# Patient Record
Sex: Female | Born: 1975
Health system: Southern US, Community
[De-identification: ages and names within clinical notes are randomized; demographics above are authoritative.]

## PROBLEM LIST (undated history)

## (undated) DIAGNOSIS — M542 Cervicalgia: Secondary | ICD-10-CM

## (undated) DIAGNOSIS — J449 Chronic obstructive pulmonary disease, unspecified: Secondary | ICD-10-CM

## (undated) DIAGNOSIS — R569 Unspecified convulsions: Secondary | ICD-10-CM

## (undated) DIAGNOSIS — F32A Depression, unspecified: Secondary | ICD-10-CM

## (undated) DIAGNOSIS — M549 Dorsalgia, unspecified: Secondary | ICD-10-CM

## (undated) DIAGNOSIS — J45909 Unspecified asthma, uncomplicated: Secondary | ICD-10-CM

## (undated) DIAGNOSIS — G8929 Other chronic pain: Secondary | ICD-10-CM

## (undated) DIAGNOSIS — F419 Anxiety disorder, unspecified: Secondary | ICD-10-CM

## (undated) DIAGNOSIS — S069X9A Unspecified intracranial injury with loss of consciousness of unspecified duration, initial encounter: Secondary | ICD-10-CM

## (undated) DIAGNOSIS — F329 Major depressive disorder, single episode, unspecified: Secondary | ICD-10-CM

## (undated) DIAGNOSIS — R51 Headache: Secondary | ICD-10-CM

## (undated) DIAGNOSIS — E7800 Pure hypercholesterolemia, unspecified: Secondary | ICD-10-CM

## (undated) DIAGNOSIS — R519 Headache, unspecified: Secondary | ICD-10-CM

## (undated) DIAGNOSIS — J189 Pneumonia, unspecified organism: Secondary | ICD-10-CM

## (undated) DIAGNOSIS — K219 Gastro-esophageal reflux disease without esophagitis: Secondary | ICD-10-CM

## (undated) DIAGNOSIS — M1990 Unspecified osteoarthritis, unspecified site: Secondary | ICD-10-CM

## (undated) DIAGNOSIS — J42 Unspecified chronic bronchitis: Secondary | ICD-10-CM

## (undated) DIAGNOSIS — D649 Anemia, unspecified: Secondary | ICD-10-CM

## (undated) DIAGNOSIS — E162 Hypoglycemia, unspecified: Secondary | ICD-10-CM

## (undated) HISTORY — PX: BRAIN SURGERY: SHX531

---

## 2000-11-17 HISTORY — PX: DILATION AND CURETTAGE OF UTERUS: SHX78

## 2000-11-17 HISTORY — PX: WISDOM TOOTH EXTRACTION: SHX21

## 2004-11-17 HISTORY — PX: TUBAL LIGATION: SHX77

## 2009-11-29 ENCOUNTER — Ambulatory Visit: Payer: Self-pay | Admitting: Radiology

## 2009-11-29 ENCOUNTER — Emergency Department (HOSPITAL_BASED_OUTPATIENT_CLINIC_OR_DEPARTMENT_OTHER): Admission: EM | Admit: 2009-11-29 | Discharge: 2009-11-29 | Payer: Self-pay | Admitting: Emergency Medicine

## 2009-12-05 ENCOUNTER — Emergency Department (HOSPITAL_BASED_OUTPATIENT_CLINIC_OR_DEPARTMENT_OTHER): Admission: EM | Admit: 2009-12-05 | Discharge: 2009-12-06 | Payer: Self-pay | Admitting: Emergency Medicine

## 2009-12-20 ENCOUNTER — Ambulatory Visit: Payer: Self-pay | Admitting: Diagnostic Radiology

## 2009-12-20 ENCOUNTER — Emergency Department (HOSPITAL_BASED_OUTPATIENT_CLINIC_OR_DEPARTMENT_OTHER): Admission: EM | Admit: 2009-12-20 | Discharge: 2009-12-20 | Payer: Self-pay | Admitting: Emergency Medicine

## 2009-12-28 ENCOUNTER — Ambulatory Visit: Payer: Self-pay | Admitting: Diagnostic Radiology

## 2009-12-28 ENCOUNTER — Emergency Department (HOSPITAL_BASED_OUTPATIENT_CLINIC_OR_DEPARTMENT_OTHER): Admission: EM | Admit: 2009-12-28 | Discharge: 2009-12-28 | Payer: Self-pay | Admitting: Emergency Medicine

## 2010-01-19 ENCOUNTER — Emergency Department (HOSPITAL_BASED_OUTPATIENT_CLINIC_OR_DEPARTMENT_OTHER): Admission: EM | Admit: 2010-01-19 | Discharge: 2010-01-19 | Payer: Self-pay | Admitting: Emergency Medicine

## 2010-01-19 ENCOUNTER — Ambulatory Visit: Payer: Self-pay | Admitting: Diagnostic Radiology

## 2010-03-24 ENCOUNTER — Emergency Department (HOSPITAL_BASED_OUTPATIENT_CLINIC_OR_DEPARTMENT_OTHER): Admission: EM | Admit: 2010-03-24 | Discharge: 2010-03-24 | Payer: Self-pay | Admitting: Emergency Medicine

## 2011-08-18 DIAGNOSIS — S069XAA Unspecified intracranial injury with loss of consciousness status unknown, initial encounter: Secondary | ICD-10-CM

## 2011-08-18 DIAGNOSIS — S069X9A Unspecified intracranial injury with loss of consciousness of unspecified duration, initial encounter: Secondary | ICD-10-CM

## 2011-08-18 HISTORY — DX: Unspecified intracranial injury with loss of consciousness of unspecified duration, initial encounter: S06.9X9A

## 2011-08-18 HISTORY — PX: LACERATION REPAIR: SHX5168

## 2011-08-18 HISTORY — DX: Unspecified intracranial injury with loss of consciousness status unknown, initial encounter: S06.9XAA

## 2011-09-14 ENCOUNTER — Emergency Department (HOSPITAL_COMMUNITY): Payer: No Typology Code available for payment source

## 2011-09-14 ENCOUNTER — Inpatient Hospital Stay (HOSPITAL_COMMUNITY)
Admission: EM | Admit: 2011-09-14 | Discharge: 2011-09-17 | DRG: 089 | Disposition: A | Discharge status: Home / Self Care | Payer: No Typology Code available for payment source | Attending: General Surgery | Admitting: General Surgery

## 2011-09-14 ENCOUNTER — Encounter (HOSPITAL_COMMUNITY): Payer: Self-pay | Admitting: Radiology

## 2011-09-14 DIAGNOSIS — Z5900 Homelessness unspecified: Secondary | ICD-10-CM

## 2011-09-14 DIAGNOSIS — S069X9A Unspecified intracranial injury with loss of consciousness of unspecified duration, initial encounter: Secondary | ICD-10-CM

## 2011-09-14 DIAGNOSIS — J9600 Acute respiratory failure, unspecified whether with hypoxia or hypercapnia: Secondary | ICD-10-CM

## 2011-09-14 DIAGNOSIS — S0100XA Unspecified open wound of scalp, initial encounter: Secondary | ICD-10-CM | POA: Diagnosis present

## 2011-09-14 DIAGNOSIS — S0180XA Unspecified open wound of other part of head, initial encounter: Secondary | ICD-10-CM | POA: Diagnosis present

## 2011-09-14 DIAGNOSIS — D62 Acute posthemorrhagic anemia: Secondary | ICD-10-CM | POA: Diagnosis present

## 2011-09-14 DIAGNOSIS — S060X0A Concussion without loss of consciousness, initial encounter: Principal | ICD-10-CM | POA: Diagnosis present

## 2011-09-14 DIAGNOSIS — Y92410 Unspecified street and highway as the place of occurrence of the external cause: Secondary | ICD-10-CM

## 2011-09-14 DIAGNOSIS — Z23 Encounter for immunization: Secondary | ICD-10-CM

## 2011-09-14 DIAGNOSIS — F1010 Alcohol abuse, uncomplicated: Secondary | ICD-10-CM | POA: Diagnosis present

## 2011-09-14 LAB — POCT I-STAT, CHEM 8
BUN: 9 mg/dL (ref 6–23)
Calcium, Ion: 1.14 mmol/L (ref 1.12–1.32)
Chloride: 104 mEq/L (ref 96–112)
HCT: 38 % (ref 36.0–46.0)
Hemoglobin: 12.9 g/dL (ref 12.0–15.0)
Potassium: 3.1 mEq/L — ABNORMAL LOW (ref 3.5–5.1)
TCO2: 22 mmol/L (ref 0–100)

## 2011-09-14 LAB — ETHANOL: Alcohol, Ethyl (B): 108 mg/dL — ABNORMAL HIGH (ref 0–11)

## 2011-09-14 LAB — COMPREHENSIVE METABOLIC PANEL
Albumin: 3.7 g/dL (ref 3.5–5.2)
BUN: 9 mg/dL (ref 6–23)
Chloride: 102 mEq/L (ref 96–112)
Creatinine, Ser: 0.79 mg/dL (ref 0.50–1.10)
GFR calc Af Amer: >90 mL/min (ref >90)
GFR calc non Af Amer: >90 mL/min (ref >90)
Glucose, Bld: 110 mg/dL — ABNORMAL HIGH (ref 70–99)
Potassium: 3 mEq/L — ABNORMAL LOW (ref 3.5–5.1)
Total Bilirubin: 0.1 mg/dL — ABNORMAL LOW (ref 0.3–1.2)

## 2011-09-14 LAB — CBC
HCT: 35.6 % — ABNORMAL LOW (ref 36.0–46.0)
MCH: 31.7 pg (ref 26.0–34.0)
MCHC: 34 g/dL (ref 30.0–36.0)
RBC: 3.82 MIL/uL — ABNORMAL LOW (ref 3.87–5.11)
WBC: 17.3 10*3/uL — ABNORMAL HIGH (ref 4.0–10.5)

## 2011-09-14 LAB — ABO/RH: ABO/RH(D): O POS

## 2011-09-14 LAB — PROTIME-INR: INR: 1.09 (ref 0.00–1.49)

## 2011-09-14 MED ORDER — IOHEXOL 300 MG/ML  SOLN
100.0000 mL | Freq: Once | INTRAMUSCULAR | Status: AC | PRN
  Administered 2011-09-14: 100 mL via INTRAVENOUS

## 2011-09-15 ENCOUNTER — Inpatient Hospital Stay (HOSPITAL_COMMUNITY): Payer: No Typology Code available for payment source

## 2011-09-15 LAB — CBC
HCT: 32.2 % — ABNORMAL LOW (ref 36.0–46.0)
Hemoglobin: 10.4 g/dL — ABNORMAL LOW (ref 12.0–15.0)
MCH: 29.7 pg (ref 26.0–34.0)
MCHC: 32.3 g/dL (ref 30.0–36.0)
RBC: 3.5 MIL/uL — ABNORMAL LOW (ref 3.87–5.11)
WBC: 11.2 10*3/uL — ABNORMAL HIGH (ref 4.0–10.5)

## 2011-09-15 LAB — BASIC METABOLIC PANEL
BUN: 6 mg/dL (ref 6–23)
CO2: 26 mEq/L (ref 19–32)
Calcium: 8.2 mg/dL — ABNORMAL LOW (ref 8.4–10.5)
Chloride: 109 mEq/L (ref 96–112)
Sodium: 140 mEq/L (ref 135–145)

## 2011-09-16 ENCOUNTER — Inpatient Hospital Stay (HOSPITAL_COMMUNITY): Payer: No Typology Code available for payment source

## 2011-09-17 LAB — TYPE AND SCREEN
ABO/RH(D): O POS
Unit division: 0
Unit division: 0
Unit division: 0
Unit division: 0
Unit division: 0
Unit division: 0

## 2011-09-17 LAB — POCT I-STAT 4, (NA,K, GLUC, HGB,HCT)
Glucose, Bld: 103 mg/dL — ABNORMAL HIGH (ref 70–99)
HCT: 37 % (ref 36.0–46.0)
Sodium: 140 meq/L (ref 135–145)
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
## 2011-09-26 ENCOUNTER — Emergency Department (HOSPITAL_BASED_OUTPATIENT_CLINIC_OR_DEPARTMENT_OTHER)
Admission: EM | Admit: 2011-09-26 | Discharge: 2011-09-26 | Disposition: A | Discharge status: Home / Self Care | Payer: No Typology Code available for payment source | Attending: Emergency Medicine | Admitting: Emergency Medicine

## 2011-09-26 ENCOUNTER — Emergency Department (INDEPENDENT_AMBULATORY_CARE_PROVIDER_SITE_OTHER): Payer: No Typology Code available for payment source

## 2011-09-26 ENCOUNTER — Encounter (HOSPITAL_BASED_OUTPATIENT_CLINIC_OR_DEPARTMENT_OTHER): Payer: Self-pay | Admitting: *Deleted

## 2011-09-26 DIAGNOSIS — R51 Headache: Secondary | ICD-10-CM

## 2011-09-26 DIAGNOSIS — E119 Type 2 diabetes mellitus without complications: Secondary | ICD-10-CM | POA: Insufficient documentation

## 2011-09-26 DIAGNOSIS — H9209 Otalgia, unspecified ear: Secondary | ICD-10-CM

## 2011-09-26 DIAGNOSIS — S0180XA Unspecified open wound of other part of head, initial encounter: Secondary | ICD-10-CM

## 2011-09-26 DIAGNOSIS — Z4802 Encounter for removal of sutures: Secondary | ICD-10-CM | POA: Insufficient documentation

## 2011-09-26 DIAGNOSIS — M25569 Pain in unspecified knee: Secondary | ICD-10-CM | POA: Insufficient documentation

## 2011-09-26 DIAGNOSIS — S82839A Other fracture of upper and lower end of unspecified fibula, initial encounter for closed fracture: Secondary | ICD-10-CM

## 2011-09-26 DIAGNOSIS — S82409A Unspecified fracture of shaft of unspecified fibula, initial encounter for closed fracture: Secondary | ICD-10-CM

## 2011-09-26 MED ORDER — ONDANSETRON HCL 4 MG/2ML IJ SOLN
INTRAMUSCULAR | Status: AC
  Filled 2011-09-26: qty 2

## 2011-09-26 MED ORDER — HYDROMORPHONE HCL PF 2 MG/ML IJ SOLN
2.0000 mg | Freq: Once | INTRAMUSCULAR | Status: AC
  Administered 2011-09-26: 2 mg via INTRAMUSCULAR
  Filled 2011-09-26 (×2): qty 1

## 2011-09-26 MED ORDER — OXYCODONE-ACETAMINOPHEN 5-325 MG PO TABS: 1.0000 | ORAL_TABLET | Freq: Four times a day (QID) | ORAL | Status: DC | PRN
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
## 2011-09-26 NOTE — ED Notes (Signed)
Pt transported to radiology.

## 2011-09-28 ENCOUNTER — Encounter (HOSPITAL_BASED_OUTPATIENT_CLINIC_OR_DEPARTMENT_OTHER): Payer: Self-pay | Admitting: *Deleted

## 2011-09-28 ENCOUNTER — Emergency Department (HOSPITAL_BASED_OUTPATIENT_CLINIC_OR_DEPARTMENT_OTHER)
Admission: EM | Admit: 2011-09-28 | Discharge: 2011-09-28 | Disposition: A | Discharge status: Home / Self Care | Payer: No Typology Code available for payment source | Attending: Emergency Medicine | Admitting: Emergency Medicine

## 2011-09-28 DIAGNOSIS — S82839A Other fracture of upper and lower end of unspecified fibula, initial encounter for closed fracture: Secondary | ICD-10-CM | POA: Insufficient documentation

## 2011-09-28 DIAGNOSIS — E119 Type 2 diabetes mellitus without complications: Secondary | ICD-10-CM | POA: Insufficient documentation

## 2011-09-28 MED ORDER — HYDROMORPHONE HCL PF 2 MG/ML IJ SOLN
2.0000 mg | Freq: Once | INTRAMUSCULAR | Status: AC
  Administered 2011-09-28: 2 mg via INTRAMUSCULAR
  Filled 2011-09-28: qty 1

## 2011-09-28 MED ORDER — HYDROMORPHONE HCL 4 MG PO TABS: 4.0000 mg | ORAL_TABLET | ORAL | Status: AC | PRN
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
## 2011-09-28 NOTE — ED Notes (Signed)
rx x 1 for dilaudid- pt has a ride
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
## 2011-10-14 ENCOUNTER — Encounter (HOSPITAL_BASED_OUTPATIENT_CLINIC_OR_DEPARTMENT_OTHER): Payer: Self-pay | Admitting: Student

## 2011-10-14 ENCOUNTER — Emergency Department (HOSPITAL_BASED_OUTPATIENT_CLINIC_OR_DEPARTMENT_OTHER)
Admission: EM | Admit: 2011-10-14 | Discharge: 2011-10-14 | Disposition: A | Discharge status: Home / Self Care | Payer: No Typology Code available for payment source | Attending: Emergency Medicine | Admitting: Emergency Medicine

## 2011-10-14 DIAGNOSIS — M79606 Pain in leg, unspecified: Secondary | ICD-10-CM

## 2011-10-14 DIAGNOSIS — M79609 Pain in unspecified limb: Secondary | ICD-10-CM | POA: Insufficient documentation

## 2011-10-14 DIAGNOSIS — E119 Type 2 diabetes mellitus without complications: Secondary | ICD-10-CM | POA: Insufficient documentation

## 2011-10-14 MED ORDER — OXYCODONE-ACETAMINOPHEN 5-325 MG PO TABS: 2.0000 | ORAL_TABLET | ORAL | Status: AC | PRN

## 2011-10-14 NOTE — ED Notes (Signed)
Pt in with continued pain to right knee and lower leg.
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
## 2011-10-15 NOTE — ED Provider Notes (Signed)
Medical screening examination/treatment/procedure(s) were performed by non-physician practitioner and as supervising physician I was immediately available for consultation/collaboration.   Jatavian Calica, MD 10/15/11 0013 

## 2012-03-12 ENCOUNTER — Emergency Department (HOSPITAL_BASED_OUTPATIENT_CLINIC_OR_DEPARTMENT_OTHER)
Admission: EM | Admit: 2012-03-12 | Discharge: 2012-03-12 | Disposition: A | Discharge status: Home / Self Care | Payer: Self-pay | Attending: Emergency Medicine | Admitting: Emergency Medicine

## 2012-03-12 ENCOUNTER — Encounter (HOSPITAL_BASED_OUTPATIENT_CLINIC_OR_DEPARTMENT_OTHER): Payer: Self-pay

## 2012-03-12 DIAGNOSIS — R112 Nausea with vomiting, unspecified: Secondary | ICD-10-CM | POA: Insufficient documentation

## 2012-03-12 DIAGNOSIS — E119 Type 2 diabetes mellitus without complications: Secondary | ICD-10-CM | POA: Insufficient documentation

## 2012-03-12 DIAGNOSIS — G43909 Migraine, unspecified, not intractable, without status migrainosus: Secondary | ICD-10-CM | POA: Insufficient documentation

## 2012-03-12 MED ORDER — METOCLOPRAMIDE HCL 5 MG/ML IJ SOLN
10.0000 mg | Freq: Once | INTRAMUSCULAR | Status: AC
  Administered 2012-03-12: 10 mg via INTRAMUSCULAR
  Filled 2012-03-12: qty 2

## 2012-03-12 MED ORDER — DEXAMETHASONE SODIUM PHOSPHATE 10 MG/ML IJ SOLN
10.0000 mg | Freq: Once | INTRAMUSCULAR | Status: AC
  Administered 2012-03-12: 10 mg via INTRAMUSCULAR
  Filled 2012-03-12: qty 1

## 2012-03-12 MED ORDER — PROMETHAZINE HCL 25 MG/ML IJ SOLN
25.0000 mg | Freq: Once | INTRAMUSCULAR | Status: DC
Start: 2012-03-12 — End: 2012-03-12

## 2012-03-12 MED ORDER — DIPHENHYDRAMINE HCL 50 MG/ML IJ SOLN
25.0000 mg | Freq: Once | INTRAMUSCULAR | Status: DC
  Filled 2012-03-12: qty 1

## 2012-03-12 MED ORDER — BUTORPHANOL TARTRATE 2 MG/ML IJ SOLN
2.0000 mg | Freq: Once | INTRAMUSCULAR | Status: DC
  Filled 2012-03-12: qty 1
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
## 2012-03-12 NOTE — Discharge Instructions (Signed)

## 2012-03-12 NOTE — ED Notes (Signed)
Pt c/o migraine onset this morning.  Pt states it is concentrated mainly behind L eye.  Pt has HX of same.

## 2012-03-31 ENCOUNTER — Emergency Department (HOSPITAL_BASED_OUTPATIENT_CLINIC_OR_DEPARTMENT_OTHER)
Admission: EM | Admit: 2012-03-31 | Discharge: 2012-04-01 | Disposition: A | Discharge status: Home / Self Care | Payer: Self-pay | Attending: Emergency Medicine | Admitting: Emergency Medicine

## 2012-03-31 ENCOUNTER — Encounter (HOSPITAL_BASED_OUTPATIENT_CLINIC_OR_DEPARTMENT_OTHER): Payer: Self-pay | Admitting: Emergency Medicine

## 2012-03-31 DIAGNOSIS — L0291 Cutaneous abscess, unspecified: Secondary | ICD-10-CM

## 2012-03-31 DIAGNOSIS — L03319 Cellulitis of trunk, unspecified: Secondary | ICD-10-CM | POA: Insufficient documentation

## 2012-03-31 DIAGNOSIS — F17200 Nicotine dependence, unspecified, uncomplicated: Secondary | ICD-10-CM | POA: Insufficient documentation

## 2012-03-31 DIAGNOSIS — A5901 Trichomonal vulvovaginitis: Secondary | ICD-10-CM

## 2012-03-31 DIAGNOSIS — L02219 Cutaneous abscess of trunk, unspecified: Secondary | ICD-10-CM | POA: Insufficient documentation

## 2012-03-31 DIAGNOSIS — E119 Type 2 diabetes mellitus without complications: Secondary | ICD-10-CM | POA: Insufficient documentation

## 2012-03-31 HISTORY — DX: Unspecified convulsions: R56.9

## 2012-03-31 NOTE — ED Notes (Signed)
Pt reports abscess on pubic mound x1 week.  Sts she has drained it a couple times but it keeps coming back. Reports she thinks it is an ingrown hair although she could not find out.  Also she wants to be checked for "bacterial vaginosis".  Started having a discharge yesterday with low abd pain.

## 2012-04-01 LAB — URINE MICROSCOPIC-ADD ON

## 2012-04-01 LAB — URINALYSIS, ROUTINE W REFLEX MICROSCOPIC
Bilirubin Urine: NEGATIVE
Glucose, UA: NEGATIVE mg/dL
Hgb urine dipstick: NEGATIVE
Protein, ur: NEGATIVE mg/dL
Specific Gravity, Urine: 1.031 — ABNORMAL HIGH (ref 1.005–1.030)
Urobilinogen, UA: 1 mg/dL (ref 0.0–1.0)

## 2012-04-01 LAB — WET PREP, GENITAL: Yeast Wet Prep HPF POC: NONE SEEN

## 2012-04-01 MED ORDER — ONDANSETRON 8 MG PO TBDP
8.0000 mg | ORAL_TABLET | Freq: Once | ORAL | Status: AC
  Administered 2012-04-01: 8 mg via ORAL
  Filled 2012-04-01: qty 1

## 2012-04-01 MED ORDER — METRONIDAZOLE 500 MG PO TABS
2000.0000 mg | ORAL_TABLET | Freq: Once | ORAL | Status: AC
  Administered 2012-04-01: 2000 mg via ORAL
  Filled 2012-04-01: qty 4

## 2012-04-01 MED ORDER — AZITHROMYCIN 250 MG PO TABS
2000.0000 mg | ORAL_TABLET | Freq: Once | ORAL | Status: AC
  Administered 2012-04-01: 2000 mg via ORAL
  Filled 2012-04-01: qty 8

## 2012-04-01 MED ORDER — LIDOCAINE-EPINEPHRINE 2 %-1:100000 IJ SOLN
INTRAMUSCULAR | Status: AC
  Administered 2012-04-01: 1 mL
  Filled 2012-04-01: qty 1

## 2012-04-01 NOTE — ED Notes (Signed)
Cheese and crackers and Ginger Ale given with meds.

## 2012-04-01 NOTE — Discharge Instructions (Signed)

## 2012-04-01 NOTE — ED Provider Notes (Signed)
History     CSN: [id number]  Arrival date & time 03/31/12  2330   First MD Initiated Contact with Patient 04/01/12 0056      Chief Complaint  Patient presents with  . Abscess    (Consider location/radiation/quality/duration/timing/severity/associated sxs/prior treatment) HPI Is a 36 year old white female one-week history of a boil associated with a shaven pubic hair. She states she is attempted to drain it this week but it keeps returning. It is moderately painful. She's also complaining of a vaginal discharge as well as pelvic pain which began yesterday. The pain is moderate and worse with movement or palpation. She denies dysuria.  Past Medical History  Diagnosis Date  . Diabetes mellitus   . Migraine   . Seizures     Past Surgical History  Procedure Date  . Tubal ligation   . Traumatic brain injury     No family history on file.  History  Substance Use Topics  . Smoking status: Current Some Day Smoker  . Smokeless tobacco: Not on file  . Alcohol Use: Yes    OB History    Grav Para Term Preterm Abortions TAB SAB Ect Mult Living                  Review of Systems  All other systems reviewed and are negative.    Allergies  Cephalosporins; Penicillins; Erythromycin; Tramadol; and Vicodin  Home Medications   Current Outpatient Rx  Name Route Sig Dispense Refill  . ALBUTEROL SULFATE HFA 108 (90 BASE) MCG/ACT IN AERS Inhalation Inhale 2 puffs into the lungs every 6 (six) hours as needed. For shortness of breath and wheezing     . NAPROXEN SODIUM 220 MG PO TABS Oral Take 440 mg by mouth 2 (two) times daily with a meal.        BP 122/99  Pulse 99  Temp(Src) 98.3 F (36.8 C) (Oral)  Resp 16  Ht 5\' 1"  (1.549 m)  Wt 136 lb (61.689 kg)  BMI 25.70 kg/m2  SpO2 100%  LMP 03/24/2012  Physical Exam General: Well-developed, well-nourished female in no acute distress; appearance consistent with age of record HENT: normocephalic, old, well-healed left temporal  scarring Eyes: pupils equal round and reactive to light; extraocular muscles intact Neck: supple Heart: regular rate and rhythm Lungs: clear to auscultation bilaterally Abdomen: soft; nondistended; suprapubic tenderness; bowel sounds present GU: Purulent vaginal discharge; inflammation of cervix and vaginal wall; cervical motion tenderness; abnormal vaginal odor Extremities: No deformity; full range of motion Neurologic: Awake, alert and oriented; motor function intact in all extremities and symmetric; no facial droop Skin: Warm and dry Psychiatric: Normal mood and affect    ED Course  Procedures (including critical care time)  INCISION AND DRAINAGE Performed by: Paula Libra L Consent: Verbal consent obtained. Risks and benefits: risks, benefits and alternatives were discussed Type: abscess  Body area: Mons pubis  Anesthesia: local infiltration  Local anesthetic: lidocaine 2% with epinephrine  Anesthetic total: 1 ml  Complexity: Simple Blunt dissection to break up loculations  Drainage: purulent  Drainage amount: Scant   Packing material: 1/4 in iodoform gauze  Patient tolerance: Patient tolerated the procedure well with no immediate complications.      MDM   Nursing notes and vitals signs, including pulse oximetry, reviewed.  Summary of this visit's results, reviewed by myself:  Labs:  Results for orders placed during the hospital encounter of 03/31/12  URINALYSIS, ROUTINE W REFLEX MICROSCOPIC      Component Value Range  Color, Urine YELLOW  YELLOW    APPearance CLEAR  CLEAR    Specific Gravity, Urine 1.031 (*) 1.005 - 1.030    pH 6.0  5.0 - 8.0    Glucose, UA NEGATIVE  NEGATIVE (mg/dL)   Hgb urine dipstick NEGATIVE  NEGATIVE    Bilirubin Urine NEGATIVE  NEGATIVE    Ketones, ur NEGATIVE  NEGATIVE (mg/dL)   Protein, ur NEGATIVE  NEGATIVE (mg/dL)   Urobilinogen, UA 1.0  0.0 - 1.0 (mg/dL)   Nitrite NEGATIVE  NEGATIVE    Leukocytes, UA SMALL (*)  NEGATIVE   WET PREP, GENITAL      Component Value Range   Yeast Wet Prep HPF POC NONE SEEN  NONE SEEN    Trich, Wet Prep MANY (*) NONE SEEN    Clue Cells Wet Prep HPF POC NONE SEEN  NONE SEEN    WBC, Wet Prep HPF POC TOO NUMEROUS TO COUNT (*) NONE SEEN   URINE MICROSCOPIC-ADD ON      Component Value Range   Squamous Epithelial / LPF FEW (*) RARE    WBC, UA 3-6  <3 (WBC/hpf)   RBC / HPF 0-2  <3 (RBC/hpf)   Bacteria, UA FEW (*) RARE    Urine-Other MUCOUS PRESENT      2:01 AM The patient's grossly purulent discharge is concerning for gonorrhea or Chlamydia infection. Although cephalosporins are the drug of choice she is allergic to cephalosporins. We will treat her with 2 g of Zithromax. She states she's not allergic to Zithromax and has taken it before without difficulty.         Hanley Seamen, MD 04/01/12 0201

## 2012-04-02 LAB — URINE CULTURE: Colony Count: >=100000

## 2012-04-19 ENCOUNTER — Encounter (HOSPITAL_BASED_OUTPATIENT_CLINIC_OR_DEPARTMENT_OTHER): Payer: Self-pay | Admitting: Family Medicine

## 2012-04-19 ENCOUNTER — Emergency Department (HOSPITAL_BASED_OUTPATIENT_CLINIC_OR_DEPARTMENT_OTHER): Payer: Self-pay

## 2012-04-19 ENCOUNTER — Emergency Department (HOSPITAL_BASED_OUTPATIENT_CLINIC_OR_DEPARTMENT_OTHER)
Admission: EM | Admit: 2012-04-19 | Discharge: 2012-04-19 | Disposition: A | Discharge status: Home / Self Care | Payer: Self-pay | Attending: Emergency Medicine | Admitting: Emergency Medicine

## 2012-04-19 DIAGNOSIS — S335XXA Sprain of ligaments of lumbar spine, initial encounter: Secondary | ICD-10-CM | POA: Insufficient documentation

## 2012-04-19 DIAGNOSIS — W19XXXA Unspecified fall, initial encounter: Secondary | ICD-10-CM | POA: Insufficient documentation

## 2012-04-19 DIAGNOSIS — S39012A Strain of muscle, fascia and tendon of lower back, initial encounter: Secondary | ICD-10-CM

## 2012-04-19 DIAGNOSIS — F17200 Nicotine dependence, unspecified, uncomplicated: Secondary | ICD-10-CM | POA: Insufficient documentation

## 2012-04-19 DIAGNOSIS — E119 Type 2 diabetes mellitus without complications: Secondary | ICD-10-CM | POA: Insufficient documentation

## 2012-04-19 DIAGNOSIS — Z79899 Other long term (current) drug therapy: Secondary | ICD-10-CM | POA: Insufficient documentation

## 2012-04-19 DIAGNOSIS — G43909 Migraine, unspecified, not intractable, without status migrainosus: Secondary | ICD-10-CM | POA: Insufficient documentation

## 2012-04-19 MED ORDER — KETOROLAC TROMETHAMINE 60 MG/2ML IM SOLN
60.0000 mg | Freq: Once | INTRAMUSCULAR | Status: AC
  Administered 2012-04-19: 60 mg via INTRAMUSCULAR
  Filled 2012-04-19: qty 2

## 2012-04-19 MED ORDER — OXYCODONE-ACETAMINOPHEN 5-325 MG PO TABS: 2.0000 | ORAL_TABLET | Freq: Four times a day (QID) | ORAL | Status: DC | PRN

## 2012-04-19 MED ORDER — OXYCODONE-ACETAMINOPHEN 5-325 MG PO TABS
2.0000 | ORAL_TABLET | Freq: Once | ORAL | Status: AC
  Administered 2012-04-19: 2 via ORAL
  Filled 2012-04-19: qty 2

## 2012-04-19 NOTE — ED Notes (Signed)
MD at bedside. 

## 2012-04-19 NOTE — ED Provider Notes (Signed)
History   This chart was scribed for Cyndra Numbers, MD by Toya Smothers. The patient was seen in room MH12/MH12. Patient's care was started at 1758.  CSN: [id number]  Arrival date & time 04/19/12  1758   First MD Initiated Contact with Patient 04/19/12 1823      Chief Complaint  Patient presents with  . Back Pain   The history is provided by the patient. No language interpreter was used.    Christine Campbell is a 36 y.o. female who presents to the Emergency Department complaining of sudden onset moderate severe constant left lower back pain onset 20 hours described as shooting radiating down leg aggravated while walking, denying incontinance, weakness, and gait problems. Pt states that was helping someone move boxes and slipped falling onto her left lower back causing pain and loss of sleep for which she treated herself with Ibuprofen and ice. Pt has a reported  h/o Diabetes (she denies being on any medicines for this), Migraines, and Seizures and list allergies to CEPHALOSPORINS, PENICILLINS, and ERYTHROMYCIN.PAtient rates her pain as 8/10.  She has no history of malignancy, heavy steroid use for comorbid illness, prior back injury or surgery, or osteoporosis. There are no other associated or modifying factors.    Past Medical History  Diagnosis Date  . Diabetes mellitus   . Migraine   . Seizures     Past Surgical History  Procedure Date  . Tubal ligation   . Traumatic brain injury     History reviewed. No pertinent family history.  History  Substance Use Topics  . Smoking status: Current Some Day Smoker  . Smokeless tobacco: Not on file  . Alcohol Use: Yes   Review of Systems  Constitutional: Negative for fever and chills.  HENT: Negative for rhinorrhea and neck pain.   Eyes: Negative for pain.  Respiratory: Negative for cough and shortness of breath.   Cardiovascular: Negative for chest pain.  Gastrointestinal: Negative for nausea, vomiting, abdominal pain and diarrhea.    Genitourinary: Negative for dysuria.  Musculoskeletal: Positive for back pain. Negative for gait problem.  Skin: Negative for rash.  Neurological: Negative for weakness and headaches.    Allergies  Cephalosporins; Penicillins; Erythromycin; Tramadol; and Vicodin  Home Medications   Current Outpatient Rx  Name Route Sig Dispense Refill  . ALBUTEROL SULFATE HFA 108 (90 BASE) MCG/ACT IN AERS Inhalation Inhale 2 puffs into the lungs every 6 (six) hours as needed. For shortness of breath and wheezing     . NAPROXEN SODIUM 220 MG PO TABS Oral Take 440 mg by mouth 2 (two) times daily with a meal.        BP 152/89  Pulse 112  Temp(Src) 98.2 F (36.8 C) (Oral)  Resp 16  SpO2 100%  LMP 04/17/2012  Physical Exam  Nursing note and vitals reviewed. Constitutional: She is oriented to person, place, and time. She appears well-developed and well-nourished. No distress.  HENT:  Head: Normocephalic and atraumatic.  Eyes: EOM are normal. Pupils are equal, round, and reactive to light.  Neck: Neck supple. No tracheal deviation present.  Cardiovascular: Normal rate.   Pulmonary/Chest: Effort normal and breath sounds normal. No respiratory distress.  Abdominal: Soft. She exhibits no distension. There is no tenderness. There is no rebound and no guarding.  Musculoskeletal: Normal range of motion. She exhibits no edema.       Tenderness to palpation of L sacroiliac joint and L paraspinal musculature. No midline tenderness or step-off.   Neurological: She  is alert and oriented to person, place, and time. No sensory deficit.       No nerurological finding or strength deficits.   Skin: Skin is warm and dry. No rash noted.  Psychiatric: She has a normal mood and affect. Her behavior is normal.    ED Course  Procedures (including critical care time) DIAGNOSTIC STUDIES: Oxygen Saturation is 100% on room air, normal by my interpretation.    COORDINATION OF CARE: 18:30- Evaluated state of Pt's  present illness.  Labs Reviewed - No data to display Dg Lumbar Spine Complete  04/19/2012  *RADIOLOGY REPORT*  Clinical Data: Larey Seat yesterday, left-sided back pain  LUMBAR SPINE - COMPLETE 4+ VIEW  Comparison: None.  Findings: Anatomic alignment.  Slight disc space narrowing L1-L2 is chronic.  No compression fracture or traumatic subluxation.  Mild lower lumbar facet arthropathy.  IMPRESSION: No acute findings.  Mild degenerative changes as described.  Original Report Authenticated By: Elsie Stain, M.D.   Dg Pelvis 1-2 Views  04/19/2012  *RADIOLOGY REPORT*  Clinical Data: Larey Seat yesterday, left hip pain  PELVIS - 1-2 VIEW  Comparison:  None.  Findings:  There is no evidence of pelvic fracture or diastasis. No other pelvic bone lesions are seen. Incidental bilateral tubal ligation clips.  IMPRESSION: Negative.  Original Report Authenticated By: Elsie Stain, M.D.    1. Back strain, initial encounter      MDM  Patient presented after mechanical fall with what appeared to be musculoskeletal pain.  She had no red flag symptoms associated with her back pain to indicate cord injury.  Patient denied GI, urinary, or vaginal symptoms making other possibilities such as UTI, nephrolithiasis, PID, or constipation unlikely as alternative diagnoses.  Patient was given 2 tabs of percocet as she had just taken anti-inflammatory.  Plain films were performed Given degree of pain as well as the fall described.  These were negative.  Patient was given Rx for percocet and told to continue her NSAIDS.  She requested a muscle relaxer "other than flexeril" but was told that this was not necessary today as her symptoms did not represent spasmodic pain.  Patient was discharged in good condition. I personally performed the services described in this documentation, which was scribed in my presence. The recorded information has been reviewed and considered.      Cyndra Numbers, MD 04/21/12 [phone]

## 2012-04-19 NOTE — ED Notes (Signed)
Pt sts she was carrying boxes down steps and slipped and fell onto concrete hit left low back. Pt ambulatory.  Pt took otc ibuprofen.

## 2012-04-19 NOTE — ED Notes (Signed)
Pt called to verifiy she had a ride

## 2012-04-30 ENCOUNTER — Emergency Department (HOSPITAL_BASED_OUTPATIENT_CLINIC_OR_DEPARTMENT_OTHER)
Admission: EM | Admit: 2012-04-30 | Discharge: 2012-04-30 | Disposition: A | Discharge status: Home / Self Care | Payer: Self-pay | Attending: Emergency Medicine | Admitting: Emergency Medicine

## 2012-04-30 ENCOUNTER — Encounter (HOSPITAL_BASED_OUTPATIENT_CLINIC_OR_DEPARTMENT_OTHER): Payer: Self-pay | Admitting: *Deleted

## 2012-04-30 DIAGNOSIS — L03211 Cellulitis of face: Secondary | ICD-10-CM | POA: Insufficient documentation

## 2012-04-30 DIAGNOSIS — L700 Acne vulgaris: Secondary | ICD-10-CM

## 2012-04-30 DIAGNOSIS — L0201 Cutaneous abscess of face: Secondary | ICD-10-CM | POA: Insufficient documentation

## 2012-04-30 DIAGNOSIS — E119 Type 2 diabetes mellitus without complications: Secondary | ICD-10-CM | POA: Insufficient documentation

## 2012-04-30 DIAGNOSIS — L708 Other acne: Secondary | ICD-10-CM | POA: Insufficient documentation

## 2012-04-30 DIAGNOSIS — F17200 Nicotine dependence, unspecified, uncomplicated: Secondary | ICD-10-CM | POA: Insufficient documentation

## 2012-04-30 DIAGNOSIS — L0390 Cellulitis, unspecified: Secondary | ICD-10-CM

## 2012-04-30 MED ORDER — OXYCODONE-ACETAMINOPHEN 5-325 MG PO TABS: 1.0000 | ORAL_TABLET | ORAL | Status: AC | PRN

## 2012-04-30 MED ORDER — MINOCYCLINE HCL 100 MG PO CAPS: 100.0000 mg | ORAL_CAPSULE | Freq: Two times a day (BID) | ORAL | Status: DC

## 2012-04-30 MED ORDER — DOXYCYCLINE HYCLATE 100 MG PO TABS: 100.0000 mg | ORAL_TABLET | Freq: Two times a day (BID) | ORAL | Status: DC

## 2012-04-30 MED ORDER — OXYCODONE-ACETAMINOPHEN 5-325 MG PO TABS: 2.0000 | ORAL_TABLET | Freq: Four times a day (QID) | ORAL | Status: DC | PRN

## 2012-04-30 NOTE — Discharge Instructions (Signed)
Make sure to use warm compresses on your chin three times daily for 15 minutes. Start antibiotic twice daily. If this fails to improve, spreads, you have fever or vomiting then return to the emergency department.

## 2012-04-30 NOTE — ED Notes (Signed)
Pt c/o abscess to right chin x 2 days

## 2012-04-30 NOTE — ED Provider Notes (Signed)
History     CSN: [id number]  Arrival date & time 04/30/12  1638   First MD Initiated Contact with Patient 04/30/12 1656      Chief Complaint  Patient presents with  . Abscess    (Consider location/radiation/quality/duration/timing/severity/associated sxs/prior treatment) HPI Comments: 36 yo female with abscess on right jawline. She noticed a pimple one week ago. Squeezed and didn't have much pus. Continued to grow and causing much pain, turning red on the outside, feeling of fullness in cheek. She denies dysphagia, odynophagia, fever, dyspnea, stridor, neck pain.   Patient is a 36 y.o. female presenting with abscess.  Abscess  Pertinent negatives include no fever and no cough.    Past Medical History  Diagnosis Date  . Diabetes mellitus   . Migraine   . Seizures     Past Surgical History  Procedure Date  . Tubal ligation   . Traumatic brain injury     History reviewed. No pertinent family history.  History  Substance Use Topics  . Smoking status: Current Some Day Smoker -- 0.5 packs/day  . Smokeless tobacco: Not on file  . Alcohol Use: Yes    OB History    Grav Para Term Preterm Abortions TAB SAB Ect Mult Living                  Review of Systems  Constitutional: Negative for fever, chills and fatigue.  HENT: Positive for facial swelling. Negative for ear pain, neck pain and neck stiffness.   Eyes: Negative for pain.  Respiratory: Negative for cough, chest tightness, shortness of breath and stridor.   Cardiovascular: Negative for chest pain.  All other systems reviewed and are negative.    Allergies  Cephalosporins; Penicillins; Erythromycin; Tramadol; and Vicodin  Home Medications   Current Outpatient Rx  Name Route Sig Dispense Refill  . ALBUTEROL SULFATE HFA 108 (90 BASE) MCG/ACT IN AERS Inhalation Inhale 2 puffs into the lungs every 6 (six) hours as needed. For shortness of breath and wheezing     . NAPROXEN SODIUM 220 MG PO TABS Oral Take 440  mg by mouth 2 (two) times daily with a meal.      . OXYCODONE-ACETAMINOPHEN 5-325 MG PO TABS Oral Take 2 tablets by mouth every 6 (six) hours as needed for pain. 30 tablet 0    BP 135/85  Pulse 87  Temp 97.8 F (36.6 C) (Oral)  Resp 16  Ht 5\' 1"  (1.549 m)  Wt 136 lb (61.689 kg)  BMI 25.70 kg/m2  SpO2 100%  LMP 04/16/2012  Physical Exam  Vitals reviewed. Constitutional: She is oriented to person, place, and time. She appears well-developed and well-nourished. No distress.  HENT:  Head: Atraumatic.  Mouth/Throat: Oropharynx is clear and moist.       1.5 cm area of induration with central scabbing on right mandibular angle, underlying firm nodule. Surrounding erythema. Very TTP. No significant LAD. Normal cervical ROM.  Neck: Normal range of motion. Neck supple. No tracheal deviation present. No thyromegaly present.  Cardiovascular: Normal rate, regular rhythm and normal heart sounds.   No murmur heard. Pulmonary/Chest: Effort normal. No stridor.  Lymphadenopathy:    She has no cervical adenopathy.  Neurological: She is alert and oriented to person, place, and time. Coordination normal.  Skin: She is not diaphoretic. No pallor.  Psychiatric: She has a normal mood and affect.    ED Course  Procedures (including critical care time)  Labs Reviewed - No data to display No  results found.   1. Nodular acne   2. Cellulitis       MDM  Nodular cyst on face with some cellulitis. Bedside ultrasound does not show drainable abscess. Patient allergic to multiple antibiotics and limited by lack of insurance. Discussion with patient and pharmacy, can afford minocycline (doxycycline too expensive). Given 10 day course and short term pain relief percocet 5/325mg  #6 (patient also notes multiple allergies to pain meds). advised TID compresses to area. Discussed RTC for red flag, emesis, fever, spread of infection.         Durwin Reges, MD 04/30/12 [phone]

## 2012-05-01 NOTE — ED Provider Notes (Signed)
I saw and evaluated the patient, reviewed the resident's note and I agree with the findings and plan.   .Face to face Exam:  General:  Awake HEENT:  Atraumatic Resp:  Normal effort Abd:  Nondistended Neuro:No focal weakness Lymph: No adenopathy   Nelia Shi, MD 05/01/12 1549

## 2012-05-11 ENCOUNTER — Encounter (HOSPITAL_BASED_OUTPATIENT_CLINIC_OR_DEPARTMENT_OTHER): Payer: Self-pay | Admitting: *Deleted

## 2012-05-11 ENCOUNTER — Emergency Department (HOSPITAL_BASED_OUTPATIENT_CLINIC_OR_DEPARTMENT_OTHER)
Admission: EM | Admit: 2012-05-11 | Discharge: 2012-05-11 | Disposition: A | Discharge status: Home / Self Care | Payer: Self-pay | Attending: Emergency Medicine | Admitting: Emergency Medicine

## 2012-05-11 DIAGNOSIS — F17200 Nicotine dependence, unspecified, uncomplicated: Secondary | ICD-10-CM | POA: Insufficient documentation

## 2012-05-11 DIAGNOSIS — M549 Dorsalgia, unspecified: Secondary | ICD-10-CM | POA: Insufficient documentation

## 2012-05-11 DIAGNOSIS — E119 Type 2 diabetes mellitus without complications: Secondary | ICD-10-CM | POA: Insufficient documentation

## 2012-05-11 MED ORDER — OXYCODONE-ACETAMINOPHEN 5-325 MG PO TABS
2.0000 | ORAL_TABLET | Freq: Once | ORAL | Status: AC
  Administered 2012-05-11: 2 via ORAL

## 2012-05-11 MED ORDER — KETOROLAC TROMETHAMINE 60 MG/2ML IM SOLN
60.0000 mg | Freq: Once | INTRAMUSCULAR | Status: AC
  Administered 2012-05-11: 60 mg via INTRAMUSCULAR
  Filled 2012-05-11: qty 2

## 2012-05-11 MED ORDER — OXYCODONE-ACETAMINOPHEN 5-325 MG PO TABS: 2.0000 | ORAL_TABLET | ORAL | Status: AC | PRN

## 2012-05-11 MED ORDER — OXYCODONE-ACETAMINOPHEN 5-325 MG PO TABS
ORAL_TABLET | ORAL | Status: AC
  Filled 2012-05-11: qty 2

## 2012-05-11 MED ORDER — PREDNISONE 10 MG PO TABS: ORAL_TABLET | ORAL | Status: DC

## 2012-05-11 NOTE — Discharge Instructions (Signed)
Back Pain, Adult Low back pain is very common. About 1 in 5 people have back pain.The cause of low back pain is rarely dangerous. The pain often gets better over time.About half of people with a sudden onset of back pain feel better in just 2 weeks. About 8 in 10 people feel better by 6 weeks.  CAUSES Some common causes of back pain include:  Strain of the muscles or ligaments supporting the spine.   Wear and tear (degeneration) of the spinal discs.   Arthritis.   Direct injury to the back.  DIAGNOSIS Most of the time, the direct cause of low back pain is not known.However, back pain can be treated effectively even when the exact cause of the pain is unknown.Answering your caregiver's questions about your overall health and symptoms is one of the most accurate ways to make sure the cause of your pain is not dangerous. If your caregiver needs more information, he or she may order lab work or imaging tests (X-rays or MRIs).However, even if imaging tests show changes in your back, this usually does not require surgery. HOME CARE INSTRUCTIONS For many people, back pain returns.Since low back pain is rarely dangerous, it is often a condition that people can learn to manageon their own.   Remain active. It is stressful on the back to sit or stand in one place. Do not sit, drive, or stand in one place for more than 30 minutes at a time. Take short walks on level surfaces as soon as pain allows.Try to increase the length of time you walk each day.   Do not stay in bed.Resting more than 1 or 2 days can delay your recovery.   Do not avoid exercise or work.Your body is made to move.It is not dangerous to be active, even though your back may hurt.Your back will likely heal faster if you return to being active before your pain is gone.   Pay attention to your body when you bend and lift. Many people have less discomfortwhen lifting if they bend their knees, keep the load close to their  bodies,and avoid twisting. Often, the most comfortable positions are those that put less stress on your recovering back.   Find a comfortable position to sleep. Use a firm mattress and lie on your side with your knees slightly bent. If you lie on your back, put a pillow under your knees.   Only take over-the-counter or prescription medicines as directed by your caregiver. Over-the-counter medicines to reduce pain and inflammation are often the most helpful.Your caregiver may prescribe muscle relaxant drugs.These medicines help dull your pain so you can more quickly return to your normal activities and healthy exercise.   Put ice on the injured area.   Put ice in a plastic bag.   Place a towel between your skin and the bag.   Leave the ice on for 15 to 20 minutes, 3 to 4 times a day for the first 2 to 3 days. After that, ice and heat may be alternated to reduce pain and spasms.   Ask your caregiver about trying back exercises and gentle massage. This may be of some benefit.   Avoid feeling anxious or stressed.Stress increases muscle tension and can worsen back pain.It is important to recognize when you are anxious or stressed and learn ways to manage it.Exercise is a great option.  SEEK MEDICAL CARE IF:  You have pain that is not relieved with rest or medicine.   You have   pain that does not improve in 1 week.   You have new symptoms.   You are generally not feeling well.  SEEK IMMEDIATE MEDICAL CARE IF:   You have pain that radiates from your back into your legs.   You develop new bowel or bladder control problems.   You have unusual weakness or numbness in your arms or legs.   You develop nausea or vomiting.   You develop abdominal pain.   You feel faint.  Document Released: 11/03/2005 Document Revised: 10/23/2011 Document Reviewed: 03/24/2011 [employer] Patient Information 2012 [employer]. 

## 2012-05-11 NOTE — ED Provider Notes (Signed)
History     CSN: [id number]  Arrival date & time 05/11/12  1718   First MD Initiated Contact with Patient 05/11/12 1915      Chief Complaint  Patient presents with  . Back Pain    (Consider location/radiation/quality/duration/timing/severity/associated sxs/prior treatment) Patient is a 36 y.o. female presenting with back pain. The history is provided by the patient. No language interpreter was used.  Back Pain  This is a new problem. The problem occurs constantly. The problem has been gradually worsening. The pain is associated with a recent injury. The pain is present in the thoracic spine. The quality of the pain is described as stabbing, aching and burning. The pain radiates to the left thigh. Associated symptoms include tingling. She has tried nothing for the symptoms.  Pt complains of pain in her low back after lifting furniture.  Pt complains of pain mid upper left back that goes down to left leg,  Past Medical History  Diagnosis Date  . Diabetes mellitus   . Migraine   . Seizures     Past Surgical History  Procedure Date  . Tubal ligation   . Traumatic brain injury     No family history on file.  History  Substance Use Topics  . Smoking status: Current Some Day Smoker -- 0.5 packs/day  . Smokeless tobacco: Not on file  . Alcohol Use: Yes    OB History    Grav Para Term Preterm Abortions TAB SAB Ect Mult Living                  Review of Systems  Musculoskeletal: Positive for back pain.  Neurological: Positive for tingling.  All other systems reviewed and are negative.    Allergies  Cephalosporins; Penicillins; Erythromycin; Tramadol; and Vicodin  Home Medications   Current Outpatient Rx  Name Route Sig Dispense Refill  . ALBUTEROL SULFATE HFA 108 (90 BASE) MCG/ACT IN AERS Inhalation Inhale 2 puffs into the lungs every 6 (six) hours as needed. For shortness of breath and wheezing     . MINOCYCLINE HCL 100 MG PO CAPS Oral Take 1 capsule (100 mg  total) by mouth 2 (two) times daily. 20 capsule 0  . OXYCODONE-ACETAMINOPHEN 5-325 MG PO TABS Oral Take 1 tablet by mouth every 4 (four) hours as needed for pain. 6 tablet 0    BP 137/79  Pulse 90  Temp 98 F (36.7 C) (Oral)  Resp 20  SpO2 100%  LMP 04/16/2012  Physical Exam  Nursing note and vitals reviewed. Constitutional: She is oriented to person, place, and time. She appears well-developed and well-nourished.  HENT:  Head: Normocephalic and atraumatic.  Eyes: Pupils are equal, round, and reactive to light.  Cardiovascular: Normal rate and regular rhythm.   Pulmonary/Chest: Effort normal and breath sounds normal.  Abdominal: Soft. Bowel sounds are normal.  Musculoskeletal: Normal range of motion.  Neurological: She is alert and oriented to person, place, and time. She has normal reflexes.  Skin: Skin is warm.  Psychiatric: She has a normal mood and affect.    ED Course  Procedures (including critical care time)  Labs Reviewed - No data to display No results found.   No diagnosis found.   Pt given torodol here.  Pt given rx for percocet and prednisone MDM  Pt advised to follow up with her Orthopaedist      Elson Areas, [place] 05/11/12 1955

## 2012-05-11 NOTE — ED Notes (Signed)
No answer in waiting room when called to treatment room

## 2012-05-11 NOTE — ED Notes (Signed)
PA at bedside.

## 2012-05-11 NOTE — ED Notes (Signed)
Back pain with radiation into her left leg. Helped her sister move this weekend and thinks she may have pulled a muscle.

## 2012-05-12 NOTE — ED Provider Notes (Signed)
Medical screening examination/treatment/procedure(s) were performed by non-physician practitioner and as supervising physician I was immediately available for consultation/collaboration.   Aliveah Gallant, MD 05/12/12 1720 

## 2012-06-05 ENCOUNTER — Emergency Department (HOSPITAL_BASED_OUTPATIENT_CLINIC_OR_DEPARTMENT_OTHER)
Admission: EM | Admit: 2012-06-05 | Discharge: 2012-06-05 | Disposition: A | Discharge status: Home / Self Care | Payer: Self-pay | Attending: Emergency Medicine | Admitting: Emergency Medicine

## 2012-06-05 ENCOUNTER — Emergency Department (HOSPITAL_BASED_OUTPATIENT_CLINIC_OR_DEPARTMENT_OTHER): Payer: Self-pay

## 2012-06-05 ENCOUNTER — Encounter (HOSPITAL_BASED_OUTPATIENT_CLINIC_OR_DEPARTMENT_OTHER): Payer: Self-pay

## 2012-06-05 DIAGNOSIS — W010XXA Fall on same level from slipping, tripping and stumbling without subsequent striking against object, initial encounter: Secondary | ICD-10-CM | POA: Insufficient documentation

## 2012-06-05 DIAGNOSIS — Y93E1 Activity, personal bathing and showering: Secondary | ICD-10-CM | POA: Insufficient documentation

## 2012-06-05 DIAGNOSIS — Y92009 Unspecified place in unspecified non-institutional (private) residence as the place of occurrence of the external cause: Secondary | ICD-10-CM | POA: Insufficient documentation

## 2012-06-05 DIAGNOSIS — M549 Dorsalgia, unspecified: Secondary | ICD-10-CM | POA: Insufficient documentation

## 2012-06-05 DIAGNOSIS — R51 Headache: Secondary | ICD-10-CM | POA: Insufficient documentation

## 2012-06-05 DIAGNOSIS — F17200 Nicotine dependence, unspecified, uncomplicated: Secondary | ICD-10-CM | POA: Insufficient documentation

## 2012-06-05 DIAGNOSIS — W19XXXA Unspecified fall, initial encounter: Secondary | ICD-10-CM

## 2012-06-05 DIAGNOSIS — E119 Type 2 diabetes mellitus without complications: Secondary | ICD-10-CM | POA: Insufficient documentation

## 2012-06-05 MED ORDER — KETOROLAC TROMETHAMINE 60 MG/2ML IM SOLN
60.0000 mg | Freq: Once | INTRAMUSCULAR | Status: AC
  Administered 2012-06-05: 60 mg via INTRAMUSCULAR
  Filled 2012-06-05: qty 2

## 2012-06-05 MED ORDER — OXYCODONE-ACETAMINOPHEN 5-325 MG PO TABS
1.0000 | ORAL_TABLET | Freq: Once | ORAL | Status: AC
  Administered 2012-06-05: 1 via ORAL
  Filled 2012-06-05: qty 1

## 2012-06-05 MED ORDER — OXYCODONE-ACETAMINOPHEN 5-325 MG PO TABS: 2.0000 | ORAL_TABLET | ORAL | Status: AC | PRN

## 2012-06-05 NOTE — ED Provider Notes (Signed)
History     CSN: [id number]  Arrival date & time 06/05/12  1215   First MD Initiated Contact with Patient 06/05/12 1300      Chief Complaint  Patient presents with  . Back Pain  . Headache    (Consider location/radiation/quality/duration/timing/severity/associated sxs/prior treatment) HPI Comments: Pt states that she fell getting out of the shower this morning:pt states that she has a headache and lower back pain:pt denies numbness or weakness  Patient is a 36 y.o. female presenting with fall. The history is provided by the patient. No language interpreter was used.  Fall The accident occurred 3 to 5 hours ago. She landed on a hard floor. The point of impact was the head (lower back). The pain is moderate. She was ambulatory at the scene. There was no entrapment after the fall. There was no drug use involved in the accident. There was no alcohol use involved in the accident. Associated symptoms include headaches. Pertinent negatives include no visual change, no fever, no vomiting and no loss of consciousness. The symptoms are aggravated by activity.    Past Medical History  Diagnosis Date  . Diabetes mellitus   . Migraine   . Seizures     Past Surgical History  Procedure Date  . Tubal ligation   . Traumatic brain injury     No family history on file.  History  Substance Use Topics  . Smoking status: Current Some Day Smoker -- 0.5 packs/day  . Smokeless tobacco: Not on file  . Alcohol Use: Yes    OB History    Grav Para Term Preterm Abortions TAB SAB Ect Mult Living                  Review of Systems  Constitutional: Negative for fever.  Respiratory: Negative.   Cardiovascular: Negative.   Gastrointestinal: Negative for vomiting.  Neurological: Positive for headaches. Negative for loss of consciousness.    Allergies  Cephalosporins; Penicillins; Erythromycin; Tramadol; and Vicodin  Home Medications   Current Outpatient Rx  Name Route Sig Dispense Refill   . PREDNISONE 10 MG PO TABS  6,5,4,3,2,1 taper 21 tablet 0    BP 152/94  Pulse 114  Temp 98.3 F (36.8 C)  Resp 18  SpO2 98%  Physical Exam  Nursing note and vitals reviewed. Constitutional: She is oriented to person, place, and time. She appears well-developed and well-nourished.  HENT:  Head: Normocephalic and atraumatic.  Eyes: Conjunctivae and EOM are normal.  Neck: Neck supple.  Cardiovascular: Normal rate and regular rhythm.   Pulmonary/Chest: Effort normal and breath sounds normal.  Abdominal: Soft. Bowel sounds are normal. There is no tenderness.  Musculoskeletal:       Cervical back: Normal.       Thoracic back: Normal.       Lumbar back: She exhibits bony tenderness.  Neurological: She is alert and oriented to person, place, and time. Coordination normal.  Skin: Skin is warm and dry.  Psychiatric: She has a normal mood and affect.    ED Course  Procedures (including critical care time)  Labs Reviewed - No data to display Dg Lumbar Spine Complete  06/05/2012  *RADIOLOGY REPORT*  Clinical Data: Back pain after a fall.  LUMBAR SPINE - COMPLETE 4+ VIEW  Comparison: The lumbar spine radiographs 04/19/2012.  Findings: No acute displaced fractures or compression type fractures are appreciated.  Alignment is anatomic.  No pars defects.  IMPRESSION: 1.  No acute radiographic abnormality of the lumbar  spine.  Original Report Authenticated By: Florencia Reasons, M.D.   Ct Head Wo Contrast  06/05/2012  *RADIOLOGY REPORT*  Clinical Data: Headache and fall.  CT HEAD WITHOUT CONTRAST  Technique:  Contiguous axial images were obtained from the base of the skull through the vertex without contrast.  Comparison: Head CT 09/26/2011  Findings: No acute displaced skull fractures are identified.  No acute intracranial abnormality.  Specifically, no evidence of acute post-traumatic intracranial hemorrhage, no definite regions of acute/subacute cerebral ischemia, no focal mass, mass effect,  hydrocephalus or abnormal intra or extra-axial fluid collections. The visualized paranasal sinuses and mastoids are well pneumatized with exception of the right maxillary sinus which has extensive mucosal thickening.  IMPRESSION: 1.  No acute displaced skull fractures or acute intracranial abnormalities. 2.  The appearance of the brain is normal. 3.  Extensive mucosal thickening in the right maxillary sinus.  Original Report Authenticated By: Florencia Reasons, M.D.     1. Fall   2. Headache   3. Back pain       MDM  Pt is not having any deficits:imaging in normal:will send home with percocet for pain       Teressa Lower, NP 06/05/12 1431

## 2012-06-05 NOTE — ED Notes (Signed)
Pt c/o HA and back pain after falling in shower this am.  Pt states she has brain surgery and has pain at site.

## 2012-06-05 NOTE — ED Provider Notes (Signed)
Medical screening examination/treatment/procedure(s) were performed by non-physician practitioner and as supervising physician I was immediately available for consultation/collaboration.   Gwyneth Sprout, MD 06/05/12 1455

## 2012-06-23 ENCOUNTER — Encounter (HOSPITAL_BASED_OUTPATIENT_CLINIC_OR_DEPARTMENT_OTHER): Payer: Self-pay

## 2012-06-23 ENCOUNTER — Emergency Department (HOSPITAL_BASED_OUTPATIENT_CLINIC_OR_DEPARTMENT_OTHER)
Admission: EM | Admit: 2012-06-23 | Discharge: 2012-06-23 | Disposition: A | Discharge status: Home / Self Care | Payer: Self-pay | Attending: Emergency Medicine | Admitting: Emergency Medicine

## 2012-06-23 DIAGNOSIS — F17200 Nicotine dependence, unspecified, uncomplicated: Secondary | ICD-10-CM | POA: Insufficient documentation

## 2012-06-23 DIAGNOSIS — L0390 Cellulitis, unspecified: Secondary | ICD-10-CM

## 2012-06-23 DIAGNOSIS — Z880 Allergy status to penicillin: Secondary | ICD-10-CM | POA: Insufficient documentation

## 2012-06-23 DIAGNOSIS — N61 Mastitis without abscess: Secondary | ICD-10-CM | POA: Insufficient documentation

## 2012-06-23 MED ORDER — SULFAMETHOXAZOLE-TRIMETHOPRIM 800-160 MG PO TABS: 1.0000 | ORAL_TABLET | Freq: Two times a day (BID) | ORAL | Status: AC

## 2012-06-23 MED ORDER — OXYCODONE-ACETAMINOPHEN 5-325 MG PO TABS: ORAL_TABLET | ORAL | Status: AC

## 2012-06-23 MED ORDER — SULFAMETHOXAZOLE-TMP DS 800-160 MG PO TABS
1.0000 | ORAL_TABLET | Freq: Once | ORAL | Status: AC
  Administered 2012-06-23: 1 via ORAL
  Filled 2012-06-23: qty 1

## 2012-06-23 MED ORDER — OXYCODONE-ACETAMINOPHEN 5-325 MG PO TABS
2.0000 | ORAL_TABLET | Freq: Once | ORAL | Status: AC
  Administered 2012-06-23: 2 via ORAL
  Filled 2012-06-23 (×2): qty 2

## 2012-06-23 NOTE — ED Provider Notes (Signed)
History     CSN: [id number]  Arrival date & time 06/23/12  1512   First MD Initiated Contact with Patient 06/23/12 1733      Chief Complaint  Patient presents with  . Breast Problem    (Consider location/radiation/quality/duration/timing/severity/associated sxs/prior treatment) HPI  36 y.o. female in no acute distress complaining of painful lesion to right breast worsening over the course of 3 days. Patient has had several abscesses in the last several months. She denies fever, or any discharge from the lesion.   Past Medical History  Diagnosis Date  . Migraine   . Seizures     Past Surgical History  Procedure Date  . Tubal ligation   . Traumatic brain injury     No family history on file.  History  Substance Use Topics  . Smoking status: Current Some Day Smoker -- 0.5 packs/day  . Smokeless tobacco: Not on file  . Alcohol Use: No    OB History    Grav Para Term Preterm Abortions TAB SAB Ect Mult Living                  Review of Systems  Constitutional: Negative for fever.  Skin: Positive for rash.  All other systems reviewed and are negative.    Allergies  Cephalosporins; Penicillins; Erythromycin; Tramadol; and Vicodin  Home Medications   Current Outpatient Rx  Name Route Sig Dispense Refill  . OXYCODONE-ACETAMINOPHEN 5-325 MG PO TABS  1 to 2 tabs PO q6hrs  PRN for pain 15 tablet 0  . PREDNISONE 10 MG PO TABS  6,5,4,3,2,1 taper 21 tablet 0  . SULFAMETHOXAZOLE-TRIMETHOPRIM 800-160 MG PO TABS Oral Take 1 tablet by mouth every 12 (twelve) hours. 10 tablet 0    BP 126/79  Pulse 82  Temp 98 F (36.7 C) (Oral)  Resp 18  Ht 5\' 1"  (1.549 m)  Wt 136 lb (61.689 kg)  BMI 25.70 kg/m2  SpO2 100%  LMP 05/23/2012  Physical Exam  Constitutional: She is oriented to person, place, and time. She appears well-developed and well-nourished. No distress.       Obese  HENT:  Head: Normocephalic.  Eyes: Conjunctivae and EOM are normal.  Cardiovascular:  Normal rate.   Pulmonary/Chest: Effort normal.  Musculoskeletal: Normal range of motion.  Neurological: She is alert and oriented to person, place, and time.  Skin:       Indurated region approximately 5 x 10 cm at 3:00 on the right breast directly adjacent to the nipple. No fluctuance. Area is warm and tender to palpation  Psychiatric: She has a normal mood and affect.    ED Course  Procedures (including critical care time)  Labs Reviewed - No data to display No results found.   1. Cellulitis       MDM  36 year old female presenting with cellulitis to right breast onset 3 days ago. Patient has had a history of cellulitis and abscesses that are occurring frequently. I advised patient to follow with primary care to evaluate diabetic status as possible source of increased susceptibility to abscesses.  I will give the patient the first dose to Bactrim here and DC her on Bactrim DS twice a day. I have explained to her that she needs to apply warm compresses and return immediately for any fever, rapidly spreading redness or return at her leisure for softening of the lesion for incision and drainage.  Pt verbalized understanding and agrees with care plan. Outpatient follow-up and return precautions given.  Wynetta Emery, PA-C 06/23/12 1839

## 2012-06-23 NOTE — ED Notes (Signed)
Patient moved to hallway bed to wait for her ride.

## 2012-06-23 NOTE — ED Provider Notes (Signed)
Medical screening examination/treatment/procedure(s) were performed by non-physician practitioner and as supervising physician I was immediately available for consultation/collaboration.   Carleene Cooper III, MD 06/23/12 2245

## 2012-06-23 NOTE — ED Notes (Signed)
patient walked to the pharmacy to have her medications filled

## 2012-06-23 NOTE — ED Notes (Signed)
Pt reports a painful "knot" on right breast x 3 days.

## 2012-07-30 ENCOUNTER — Encounter (HOSPITAL_BASED_OUTPATIENT_CLINIC_OR_DEPARTMENT_OTHER): Payer: Self-pay | Admitting: *Deleted

## 2012-07-30 ENCOUNTER — Emergency Department (HOSPITAL_BASED_OUTPATIENT_CLINIC_OR_DEPARTMENT_OTHER)
Admission: EM | Admit: 2012-07-30 | Discharge: 2012-07-31 | Disposition: A | Discharge status: Home / Self Care | Payer: Self-pay | Attending: Emergency Medicine | Admitting: Emergency Medicine

## 2012-07-30 DIAGNOSIS — Z885 Allergy status to narcotic agent status: Secondary | ICD-10-CM | POA: Insufficient documentation

## 2012-07-30 DIAGNOSIS — Z881 Allergy status to other antibiotic agents status: Secondary | ICD-10-CM | POA: Insufficient documentation

## 2012-07-30 DIAGNOSIS — Z880 Allergy status to penicillin: Secondary | ICD-10-CM | POA: Insufficient documentation

## 2012-07-30 DIAGNOSIS — Z888 Allergy status to other drugs, medicaments and biological substances status: Secondary | ICD-10-CM | POA: Insufficient documentation

## 2012-07-30 DIAGNOSIS — R51 Headache: Secondary | ICD-10-CM | POA: Insufficient documentation

## 2012-07-30 DIAGNOSIS — F17200 Nicotine dependence, unspecified, uncomplicated: Secondary | ICD-10-CM | POA: Insufficient documentation

## 2012-07-30 LAB — PREGNANCY, URINE: Preg Test, Ur: NEGATIVE

## 2012-07-30 MED ORDER — DIPHENHYDRAMINE HCL 50 MG/ML IJ SOLN
25.0000 mg | Freq: Once | INTRAMUSCULAR | Status: AC
  Administered 2012-07-30: 25 mg via INTRAMUSCULAR
  Filled 2012-07-30: qty 1

## 2012-07-30 MED ORDER — DEXAMETHASONE SODIUM PHOSPHATE 10 MG/ML IJ SOLN
10.0000 mg | Freq: Once | INTRAMUSCULAR | Status: AC
  Administered 2012-07-30: 10 mg via INTRAMUSCULAR
  Filled 2012-07-30: qty 1

## 2012-07-30 MED ORDER — METOCLOPRAMIDE HCL 5 MG/ML IJ SOLN
10.0000 mg | Freq: Once | INTRAMUSCULAR | Status: AC
  Administered 2012-07-30: 10 mg via INTRAMUSCULAR
  Filled 2012-07-30: qty 2

## 2012-07-30 NOTE — ED Notes (Signed)
Headache since 3 am. No relief with Ibuprofen and Goodie Powders. Vomiting.

## 2012-07-30 NOTE — ED Provider Notes (Signed)
History     CSN: [id number]  Arrival date & time 07/30/12  2107   First MD Initiated Contact with Patient 07/30/12 2305      Chief Complaint  Patient presents with  . Headache    (Consider location/radiation/quality/duration/timing/severity/associated sxs/prior treatment) Patient is a 36 y.o. female presenting with headaches. The history is provided by the patient. No language interpreter was used.  Headache  This is a recurrent problem. The current episode started yesterday. The problem occurs constantly. The problem has not changed since onset.The headache is associated with bright light. The pain is located in the frontal region. The quality of the pain is described as throbbing. The pain is at a severity of 9/10. The pain is severe. The pain does not radiate. Associated symptoms include nausea. Pertinent negatives include no anorexia, no fever, no malaise/fatigue, no chest pressure, no near-syncope, no orthopnea, no palpitations, no syncope, no shortness of breath and no vomiting. She has tried acetaminophen and aspirin for the symptoms. The treatment provided no relief.    Past Medical History  Diagnosis Date  . Migraine   . Seizures     Past Surgical History  Procedure Date  . Tubal ligation   . Traumatic brain injury     No family history on file.  History  Substance Use Topics  . Smoking status: Current Some Day Smoker -- 0.5 packs/day  . Smokeless tobacco: Not on file  . Alcohol Use: No    OB History    Grav Para Term Preterm Abortions TAB SAB Ect Mult Living                  Review of Systems  Constitutional: Negative for fever and malaise/fatigue.  Eyes: Negative for visual disturbance.  Respiratory: Negative for shortness of breath.   Cardiovascular: Negative for palpitations, orthopnea, syncope and near-syncope.  Gastrointestinal: Positive for nausea. Negative for vomiting and anorexia.  Neurological: Positive for headaches. Negative for dizziness,  seizures, syncope, facial asymmetry, speech difficulty, weakness, light-headedness and numbness.  All other systems reviewed and are negative.    Allergies  Cephalosporins; Penicillins; Erythromycin; Tramadol; and Vicodin  Home Medications   Current Outpatient Rx  Name Route Sig Dispense Refill  . AMITRIPTYLINE HCL 75 MG PO TABS Oral Take 75 mg by mouth at bedtime.    . REGLAN PO Oral Take 1 tablet by mouth daily as needed. For nausea.      BP 130/90  Pulse 90  Temp 98.1 F (36.7 C) (Oral)  Resp 18  SpO2 99%  Physical Exam  Constitutional: She is oriented to person, place, and time. She appears well-developed and well-nourished. No distress.  HENT:  Head: Normocephalic and atraumatic.  Right Ear: Tympanic membrane is not injected.  Left Ear: Tympanic membrane is not injected.  Mouth/Throat: Oropharynx is clear and moist.  Eyes: EOM are normal. Pupils are equal, round, and reactive to light.  Neck: Normal range of motion. Neck supple.  Cardiovascular: Normal rate and regular rhythm.   Pulmonary/Chest: Effort normal and breath sounds normal.  Abdominal: Soft. Bowel sounds are normal. There is no tenderness. There is no rebound and no guarding.  Musculoskeletal: Normal range of motion. She exhibits no edema.  Lymphadenopathy:    She has no cervical adenopathy.  Neurological: She is alert and oriented to person, place, and time. She has normal reflexes. No cranial nerve deficit.  Skin: Skin is warm and dry.  Psychiatric: She has a normal mood and affect.  ED Course  Procedures (including critical care time)   Labs Reviewed  PREGNANCY, URINE   No results found.   No diagnosis found.    MDM  No f/c/r.  Cranial nerves are intact.  Not on OCP.  Exam and vitals reassuring.  No indication for imaging or LP.  Follow up with your family doctor return for worsening symptoms.          Jasmine Awe, MD 07/31/12 [phone]

## 2012-07-31 MED ORDER — KETOROLAC TROMETHAMINE 30 MG/ML IJ SOLN
30.0000 mg | Freq: Once | INTRAMUSCULAR | Status: AC
  Administered 2012-07-31: 30 mg via INTRAVENOUS
  Filled 2012-07-31: qty 1

## 2012-09-07 ENCOUNTER — Emergency Department (HOSPITAL_BASED_OUTPATIENT_CLINIC_OR_DEPARTMENT_OTHER): Payer: Self-pay

## 2012-09-07 ENCOUNTER — Encounter (HOSPITAL_BASED_OUTPATIENT_CLINIC_OR_DEPARTMENT_OTHER): Payer: Self-pay

## 2012-09-07 ENCOUNTER — Emergency Department (HOSPITAL_BASED_OUTPATIENT_CLINIC_OR_DEPARTMENT_OTHER)
Admission: EM | Admit: 2012-09-07 | Discharge: 2012-09-07 | Disposition: A | Discharge status: Home / Self Care | Payer: Self-pay | Attending: Emergency Medicine | Admitting: Emergency Medicine

## 2012-09-07 DIAGNOSIS — Z8669 Personal history of other diseases of the nervous system and sense organs: Secondary | ICD-10-CM | POA: Insufficient documentation

## 2012-09-07 DIAGNOSIS — S8390XA Sprain of unspecified site of unspecified knee, initial encounter: Secondary | ICD-10-CM

## 2012-09-07 DIAGNOSIS — Z87820 Personal history of traumatic brain injury: Secondary | ICD-10-CM | POA: Insufficient documentation

## 2012-09-07 DIAGNOSIS — F17200 Nicotine dependence, unspecified, uncomplicated: Secondary | ICD-10-CM | POA: Insufficient documentation

## 2012-09-07 DIAGNOSIS — Y9289 Other specified places as the place of occurrence of the external cause: Secondary | ICD-10-CM | POA: Insufficient documentation

## 2012-09-07 DIAGNOSIS — X500XXA Overexertion from strenuous movement or load, initial encounter: Secondary | ICD-10-CM | POA: Insufficient documentation

## 2012-09-07 DIAGNOSIS — R569 Unspecified convulsions: Secondary | ICD-10-CM | POA: Insufficient documentation

## 2012-09-07 DIAGNOSIS — IMO0002 Reserved for concepts with insufficient information to code with codable children: Secondary | ICD-10-CM | POA: Insufficient documentation

## 2012-09-07 DIAGNOSIS — Y9389 Activity, other specified: Secondary | ICD-10-CM | POA: Insufficient documentation

## 2012-09-07 DIAGNOSIS — Z9851 Tubal ligation status: Secondary | ICD-10-CM | POA: Insufficient documentation

## 2012-09-07 MED ORDER — NAPROXEN 500 MG PO TABS: 500.0000 mg | ORAL_TABLET | Freq: Two times a day (BID) | ORAL | Status: DC

## 2012-09-07 MED ORDER — ETODOLAC 500 MG PO TABS: 500.0000 mg | ORAL_TABLET | Freq: Two times a day (BID) | ORAL | Status: DC

## 2012-09-07 NOTE — ED Notes (Signed)
Pt reports onset of left knee pain Sunday. Possible injury after doing a cartwheel.

## 2012-09-07 NOTE — ED Notes (Signed)
Patient transported to X-ray 

## 2012-09-07 NOTE — ED Notes (Signed)
Patient back from  X-ray 

## 2012-09-07 NOTE — ED Provider Notes (Signed)
History     CSN: [id number] Arrival date & time 09/07/12  7829 First MD Initiated Contact with Patient 09/07/12 1816     Chief Complaint  Patient presents with  . Knee Pain   HPI Patient states she was playing outside with her children doing a cartwheel and injured her left knee. Drawer she fell onto her just twisted it but she is having persistent pain.  The pain increases with movement and palpation .  It is located primarily in the left knee and radiates upward. She denies any fevers, redness or lacerations.  Past Medical History  Diagnosis Date  . Migraine   . Seizures     Past Surgical History  Procedure Date  . Tubal ligation   . Traumatic brain injury     No family history on file.  History  Substance Use Topics  . Smoking status: Current Some Day Smoker -- 0.5 packs/day  . Smokeless tobacco: Not on file  . Alcohol Use: No    OB History    Grav Para Term Preterm Abortions TAB SAB Ect Mult Living                  Review of Systems  All other systems reviewed and are negative.    Allergies  Cephalosporins; Penicillins; Erythromycin; Tramadol; and Vicodin  Home Medications   Current Outpatient Rx  Name Route Sig Dispense Refill  . AMITRIPTYLINE HCL 75 MG PO TABS Oral Take 75 mg by mouth at bedtime.    . REGLAN PO Oral Take 1 tablet by mouth daily as needed. For nausea.      BP 148/94  Pulse 93  Temp 97.9 F (36.6 C) (Oral)  Resp 18  Ht 5\' 1"  (1.549 m)  Wt 140 lb (63.504 kg)  BMI 26.45 kg/m2  SpO2 100%  LMP 08/08/2012  Physical Exam  Nursing note and vitals reviewed. Constitutional: She appears well-developed and well-nourished. No distress.  HENT:  Head: Normocephalic and atraumatic.  Right Ear: External ear normal.  Left Ear: External ear normal.  Eyes: Conjunctivae normal are normal. Right eye exhibits no discharge. Left eye exhibits no discharge. No scleral icterus.  Neck: Neck supple. No tracheal deviation present.  Cardiovascular:  Normal rate.   Pulmonary/Chest: Effort normal. No stridor. No respiratory distress.  Musculoskeletal: She exhibits edema.       Left knee: She exhibits bony tenderness. She exhibits no swelling, no effusion, no deformity, no laceration, no erythema, no LCL laxity, normal patellar mobility and no MCL laxity. tenderness found.  Neurological: She is alert. Cranial nerve deficit: no gross deficits.  Skin: Skin is warm and dry. No rash noted.  Psychiatric: She has a normal mood and affect.    ED Course  Procedures (including critical care time)  Labs Reviewed - No data to display Dg Knee Complete 4 Views Left  09/07/2012  *RADIOLOGY REPORT*  Clinical Data: Left knee injury and pain.  LEFT KNEE - COMPLETE 4+ VIEW  Comparison:  None.  Findings:  There is no evidence of fracture, dislocation, or joint effusion.  There is no evidence of arthropathy or other focal bone abnormality.  Soft tissues are unremarkable.  IMPRESSION: Negative.   Original Report Authenticated By: Danae Orleans, M.D.       MDM  The patient has no evidence of fracture or dislocation. Her symptoms are consistent with a sprain. Patient will be given a knee sleeve and recommend nonsteroidal pain medications. Followup with a primary doctor or sports  medicine Dr. if the symptoms do not resolve over the next week or 2        Celene Kras, MD 09/07/12 1943

## 2012-10-30 IMAGING — CR DG PELVIS 1-2V
1 series · 1 of 1 positions shown · non-contrast
Comparison: None.

CLINICAL DATA: Fell yesterday, left hip pain

PELVIS - 1-2 VIEW

[t pelvis a.p.]
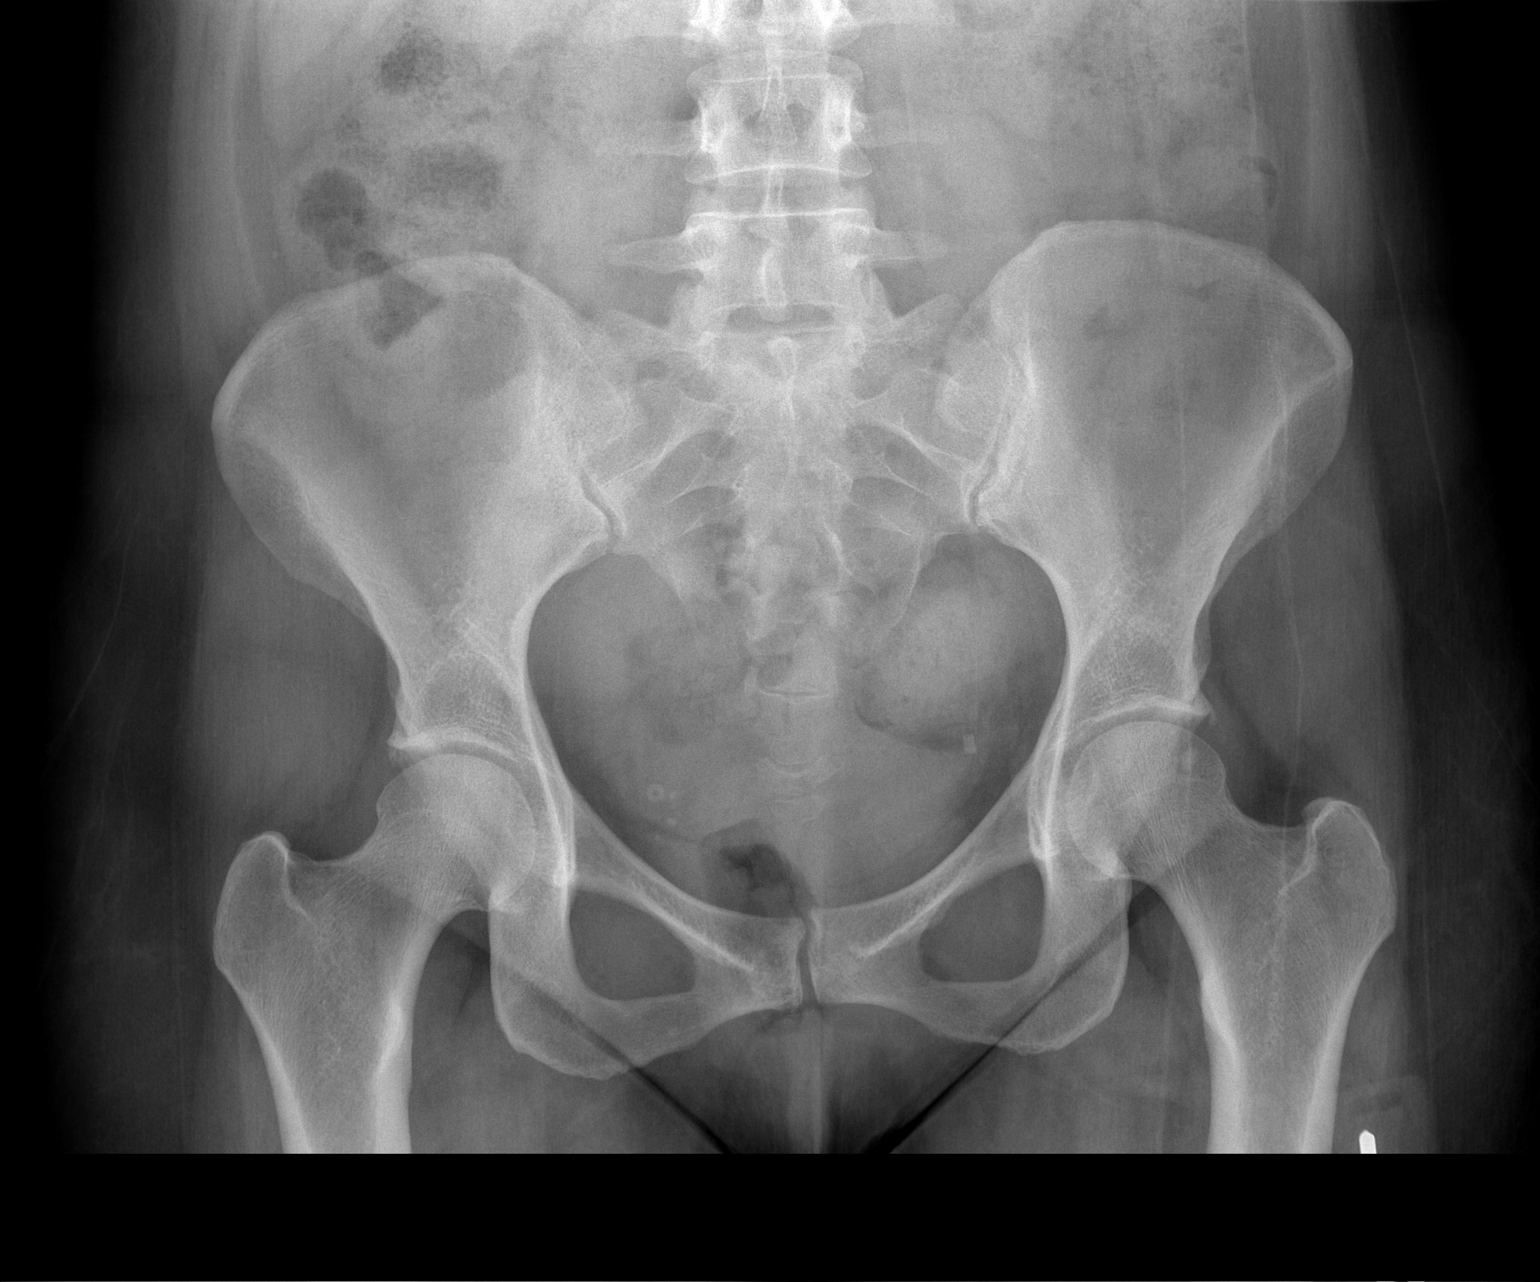

[1 of 1 positions shown; findings below may reference images not displayed]

FINDINGS: There is no evidence of pelvic fracture or diastasis.
No other pelvic bone lesions are seen. Incidental bilateral tubal
ligation clips.
IMPRESSION: Negative.

## 2012-11-30 ENCOUNTER — Encounter (HOSPITAL_BASED_OUTPATIENT_CLINIC_OR_DEPARTMENT_OTHER): Payer: Self-pay | Admitting: Emergency Medicine

## 2012-11-30 ENCOUNTER — Emergency Department (HOSPITAL_BASED_OUTPATIENT_CLINIC_OR_DEPARTMENT_OTHER)
Admission: EM | Admit: 2012-11-30 | Discharge: 2012-11-30 | Disposition: A | Discharge status: Home / Self Care | Payer: Self-pay | Attending: Emergency Medicine | Admitting: Emergency Medicine

## 2012-11-30 DIAGNOSIS — Z8679 Personal history of other diseases of the circulatory system: Secondary | ICD-10-CM | POA: Insufficient documentation

## 2012-11-30 DIAGNOSIS — K029 Dental caries, unspecified: Secondary | ICD-10-CM

## 2012-11-30 DIAGNOSIS — F17200 Nicotine dependence, unspecified, uncomplicated: Secondary | ICD-10-CM | POA: Insufficient documentation

## 2012-11-30 DIAGNOSIS — Z79899 Other long term (current) drug therapy: Secondary | ICD-10-CM | POA: Insufficient documentation

## 2012-11-30 MED ORDER — OXYCODONE-ACETAMINOPHEN 5-325 MG PO TABS
1.0000 | ORAL_TABLET | Freq: Once | ORAL | Status: AC
  Administered 2012-11-30: 1 via ORAL
  Filled 2012-11-30 (×2): qty 1

## 2012-11-30 MED ORDER — OXYCODONE-ACETAMINOPHEN 5-325 MG PO TABS: 1.0000 | ORAL_TABLET | Freq: Three times a day (TID) | ORAL | Status: DC | PRN

## 2012-11-30 MED ORDER — METRONIDAZOLE 500 MG PO TABS
500.0000 mg | ORAL_TABLET | Freq: Once | ORAL | Status: AC
  Administered 2012-11-30: 500 mg via ORAL
  Filled 2012-11-30: qty 1

## 2012-11-30 MED ORDER — METRONIDAZOLE 500 MG PO TABS: 500.0000 mg | ORAL_TABLET | Freq: Two times a day (BID) | ORAL | Status: DC

## 2012-11-30 NOTE — ED Notes (Signed)
Pt requested treatment for trichomonas prior to discharge, pt stated that her boyfriend was diagnosed with trich and that they have had unprotected sex.

## 2012-11-30 NOTE — ED Notes (Signed)
Pt c/o dental pain in 3 different teeth.

## 2012-11-30 NOTE — ED Provider Notes (Signed)
History     CSN: [id number]  Arrival date & time 11/30/12  0108   First MD Initiated Contact with Patient 11/30/12 0131      Chief Complaint  Patient presents with  . Dental Pain    (Consider location/radiation/quality/duration/timing/severity/associated sxs/prior treatment) HPI Patient presents with concerns of worsening dental pain.  She states that she has pain in several areas of her mouth, with past days and the pain has become severe.  No clear precipitant.  Since onset the pain is focally been in her bilateral lower and left upper teeth.  No relief with OTC medication.  No new fevers, chills, vomiting, diarrhea, confusion or other focal complaints. She states that she has a long history of caries, poor dentition.  She states that she intends to see a dentist in several weeks, when she can.   Past Medical History  Diagnosis Date  . Migraine   . Seizures     Past Surgical History  Procedure Date  . Tubal ligation   . Traumatic brain injury     No family history on file.  History  Substance Use Topics  . Smoking status: Current Some Day Smoker -- 0.5 packs/day  . Smokeless tobacco: Not on file  . Alcohol Use: No    OB History    Grav Para Term Preterm Abortions TAB SAB Ect Mult Living                  Review of Systems  All other systems reviewed and are negative.    Allergies  Cephalosporins; Penicillins; Erythromycin; Tramadol; and Vicodin  Home Medications   Current Outpatient Rx  Name  Route  Sig  Dispense  Refill  . AMITRIPTYLINE HCL 75 MG PO TABS   Oral   Take 75 mg by mouth at bedtime.         . ETODOLAC 500 MG PO TABS   Oral   Take 1 tablet (500 mg total) by mouth 2 (two) times daily.   20 tablet   0   . REGLAN PO   Oral   Take 1 tablet by mouth daily as needed. For nausea.         Marland Kitchen METRONIDAZOLE 500 MG PO TABS   Oral   Take 1 tablet (500 mg total) by mouth 2 (two) times daily.   14 tablet   0   . OXYCODONE-ACETAMINOPHEN  5-325 MG PO TABS   Oral   Take 1 tablet by mouth every 8 (eight) hours as needed for pain.   10 tablet   0     BP 130/80  Pulse 125  Temp 98 F (36.7 C) (Oral)  Resp 18  SpO2 95%  Physical Exam  Nursing note and vitals reviewed. Constitutional: She is oriented to person, place, and time. She appears well-developed and well-nourished. No distress.  HENT:  Head: Normocephalic and atraumatic.  Mouth/Throat:         The patient is missing the majority of her teeth.  Aside from he specifically denied teeth that are eroded, all teeth are in poor condition.  The gums have mild erythema, but no fluctuance, no induration, no drainage.  The posterior oropharynx is symmetric, without edema or erythema.  Eyes: Conjunctivae normal and EOM are normal.  Cardiovascular: Regular rhythm.  Tachycardia present.   Pulmonary/Chest: Effort normal and breath sounds normal. No stridor. No respiratory distress.  Abdominal: She exhibits no distension.  Musculoskeletal: She exhibits no edema.  Neurological: She is alert  and oriented to person, place, and time. No cranial nerve deficit.  Skin: Skin is warm and dry.  Psychiatric: She has a normal mood and affect.    ED Course  Procedures (including critical care time)  Labs Reviewed - No data to display No results found.   1. Dental caries     Update: Just prior to discharge patient states that she likely treated for Trichomonas.  She adds that her boyfriend was recently diagnosed and treated for this entity.  She defers exam  MDM  This patient presents with concerns of dental pain.  On exam she is mildly tachycardic, but in no distress.  There is no intraoral evidence of significant infection, though this possibility admitted to the patient followup with the dentist, which I discussed with her.  Absent fever, the patient was treated with analgesics.  Just prior to discharge the patient also requested treatment for Trichomonas.  This was  accommodated.     Gerhard Munch, MD 11/30/12 [phone]

## 2013-01-11 ENCOUNTER — Emergency Department (HOSPITAL_BASED_OUTPATIENT_CLINIC_OR_DEPARTMENT_OTHER): Payer: Self-pay

## 2013-01-11 ENCOUNTER — Emergency Department (HOSPITAL_BASED_OUTPATIENT_CLINIC_OR_DEPARTMENT_OTHER)
Admission: EM | Admit: 2013-01-11 | Discharge: 2013-01-11 | Disposition: A | Discharge status: Home / Self Care | Payer: Self-pay | Attending: Emergency Medicine | Admitting: Emergency Medicine

## 2013-01-11 ENCOUNTER — Encounter (HOSPITAL_BASED_OUTPATIENT_CLINIC_OR_DEPARTMENT_OTHER): Payer: Self-pay | Admitting: *Deleted

## 2013-01-11 DIAGNOSIS — Z791 Long term (current) use of non-steroidal anti-inflammatories (NSAID): Secondary | ICD-10-CM | POA: Insufficient documentation

## 2013-01-11 DIAGNOSIS — G43909 Migraine, unspecified, not intractable, without status migrainosus: Secondary | ICD-10-CM | POA: Insufficient documentation

## 2013-01-11 DIAGNOSIS — R059 Cough, unspecified: Secondary | ICD-10-CM | POA: Insufficient documentation

## 2013-01-11 DIAGNOSIS — W1809XA Striking against other object with subsequent fall, initial encounter: Secondary | ICD-10-CM | POA: Insufficient documentation

## 2013-01-11 DIAGNOSIS — IMO0001 Reserved for inherently not codable concepts without codable children: Secondary | ICD-10-CM | POA: Insufficient documentation

## 2013-01-11 DIAGNOSIS — Z792 Long term (current) use of antibiotics: Secondary | ICD-10-CM | POA: Insufficient documentation

## 2013-01-11 DIAGNOSIS — IMO0002 Reserved for concepts with insufficient information to code with codable children: Secondary | ICD-10-CM | POA: Insufficient documentation

## 2013-01-11 DIAGNOSIS — F17200 Nicotine dependence, unspecified, uncomplicated: Secondary | ICD-10-CM | POA: Insufficient documentation

## 2013-01-11 DIAGNOSIS — Z79899 Other long term (current) drug therapy: Secondary | ICD-10-CM | POA: Insufficient documentation

## 2013-01-11 DIAGNOSIS — Y939 Activity, unspecified: Secondary | ICD-10-CM | POA: Insufficient documentation

## 2013-01-11 DIAGNOSIS — Y9289 Other specified places as the place of occurrence of the external cause: Secondary | ICD-10-CM | POA: Insufficient documentation

## 2013-01-11 MED ORDER — METRONIDAZOLE 500 MG PO TABS: 500.0000 mg | ORAL_TABLET | Freq: Two times a day (BID) | ORAL | Status: DC

## 2013-01-11 MED ORDER — IBUPROFEN 800 MG PO TABS: 800.0000 mg | ORAL_TABLET | Freq: Three times a day (TID) | ORAL | Status: DC

## 2013-01-11 MED ORDER — OXYCODONE-ACETAMINOPHEN 5-325 MG PO TABS: 2.0000 | ORAL_TABLET | ORAL | Status: DC | PRN

## 2013-01-11 MED ORDER — OXYCODONE-ACETAMINOPHEN 5-325 MG PO TABS
1.0000 | ORAL_TABLET | Freq: Once | ORAL | Status: AC
  Administered 2013-01-11: 1 via ORAL
  Filled 2013-01-11 (×2): qty 1

## 2013-01-11 NOTE — ED Notes (Signed)
Pt reports left shoulder pain x 3 days denies injury

## 2013-01-11 NOTE — ED Provider Notes (Signed)
History     CSN: [id number]  Arrival date & time 01/11/13  1721   First MD Initiated Contact with Patient 01/11/13 1928      Chief Complaint  Patient presents with  . Shoulder Pain    (Consider location/radiation/quality/duration/timing/severity/associated sxs/prior treatment) Patient is a 37 y.o. female presenting with shoulder pain. The history is provided by the patient. No language interpreter was used.  Shoulder Pain This is a new problem. Episode onset: 3 days. The problem occurs constantly. The problem has been gradually worsening. Associated symptoms include arthralgias, coughing and myalgias. Pertinent negatives include no chills or congestion. Nothing aggravates the symptoms. She has tried nothing for the symptoms.  Pt complains of pain in her left shoulder.   Pt reports she fell a few weeks ago.   Pt hit her back.  No shoulder injury at the time.   Pt complains of pain with lifting arm  Past Medical History  Diagnosis Date  . Migraine   . Seizures     Past Surgical History  Procedure Laterality Date  . Tubal ligation    . Traumatic brain injury      History reviewed. No pertinent family history.  History  Substance Use Topics  . Smoking status: Current Some Day Smoker -- 0.50 packs/day    Types: Cigarettes  . Smokeless tobacco: Not on file  . Alcohol Use: No    OB History   Grav Para Term Preterm Abortions TAB SAB Ect Mult Living                  Review of Systems  Constitutional: Negative for chills.  HENT: Negative for congestion.   Respiratory: Positive for cough.   Musculoskeletal: Positive for myalgias and arthralgias.  All other systems reviewed and are negative.    Allergies  Cephalosporins; Penicillins; Erythromycin; Tramadol; and Vicodin  Home Medications   Current Outpatient Rx  Name  Route  Sig  Dispense  Refill  . amitriptyline (ELAVIL) 75 MG tablet   Oral   Take 75 mg by mouth at bedtime.         Marland Kitchen etodolac (LODINE) 500 MG  tablet   Oral   Take 1 tablet (500 mg total) by mouth 2 (two) times daily.   20 tablet   0   . Metoclopramide HCl (REGLAN PO)   Oral   Take 1 tablet by mouth daily as needed. For nausea.         . metroNIDAZOLE (FLAGYL) 500 MG tablet   Oral   Take 1 tablet (500 mg total) by mouth 2 (two) times daily.   14 tablet   0   . oxyCODONE-acetaminophen (PERCOCET/ROXICET) 5-325 MG per tablet   Oral   Take 1 tablet by mouth every 8 (eight) hours as needed for pain.   10 tablet   0     BP 131/89  Pulse 95  Temp(Src) 98.1 F (36.7 C) (Oral)  Resp 16  Ht 5\' 1"  (1.549 m)  Wt 140 lb (63.504 kg)  BMI 26.47 kg/m2  SpO2 100%  LMP 12/28/2012  Physical Exam  Vitals reviewed. Constitutional: She is oriented to person, place, and time. She appears well-developed and well-nourished.  HENT:  Head: Normocephalic and atraumatic.  Musculoskeletal: She exhibits tenderness.  Pain with range of motion left shoulder,  nv and ns intact  Neurological: She is alert and oriented to person, place, and time. She has normal reflexes.  Skin: Skin is warm.  Psychiatric: She has a  normal mood and affect.    ED Course  Procedures (including critical care time)  Labs Reviewed - No data to display Dg Shoulder Left  01/11/2013  *RADIOLOGY REPORT*  Clinical Data: Fall with left shoulder pain.  LEFT SHOULDER - 2+ VIEW  Comparison: None  Findings: There is no evidence of acute bony abnormality. There is no evidence of acute fracture, subluxation, or dislocation. No focal bony lesions are identified. The visualized left hemithorax is unremarkable.  IMPRESSION: No evidence of acute abnormality.   Original Report Authenticated By: Harmon Pier, M.D.      1. Shoulder sprain, left, initial encounter       MDM  Pt given 2 percocet.   I advised follow up with Dr. Pearletha Forge for evaluation.  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   Pt request rx for  flagyl     Elson Areas, PA 01/11/13 2042  Lonia Skinner [place], [place] 01/11/13 2049

## 2013-01-12 NOTE — ED Provider Notes (Signed)
Medical screening examination/treatment/procedure(s) were performed by non-physician practitioner and as supervising physician I was immediately available for consultation/collaboration.   Orra Nolde III, MD 01/12/13 1433 

## 2013-03-14 ENCOUNTER — Encounter (HOSPITAL_BASED_OUTPATIENT_CLINIC_OR_DEPARTMENT_OTHER): Payer: Self-pay | Admitting: *Deleted

## 2013-03-14 ENCOUNTER — Emergency Department (HOSPITAL_BASED_OUTPATIENT_CLINIC_OR_DEPARTMENT_OTHER)
Admission: EM | Admit: 2013-03-14 | Discharge: 2013-03-15 | Disposition: A | Discharge status: Home / Self Care | Payer: Self-pay | Attending: Emergency Medicine | Admitting: Emergency Medicine

## 2013-03-14 DIAGNOSIS — Z880 Allergy status to penicillin: Secondary | ICD-10-CM | POA: Insufficient documentation

## 2013-03-14 DIAGNOSIS — Z79899 Other long term (current) drug therapy: Secondary | ICD-10-CM | POA: Insufficient documentation

## 2013-03-14 DIAGNOSIS — F17200 Nicotine dependence, unspecified, uncomplicated: Secondary | ICD-10-CM | POA: Insufficient documentation

## 2013-03-14 DIAGNOSIS — Y929 Unspecified place or not applicable: Secondary | ICD-10-CM | POA: Insufficient documentation

## 2013-03-14 DIAGNOSIS — R51 Headache: Secondary | ICD-10-CM | POA: Insufficient documentation

## 2013-03-14 DIAGNOSIS — Z87820 Personal history of traumatic brain injury: Secondary | ICD-10-CM | POA: Insufficient documentation

## 2013-03-14 DIAGNOSIS — S0993XA Unspecified injury of face, initial encounter: Secondary | ICD-10-CM | POA: Insufficient documentation

## 2013-03-14 DIAGNOSIS — Y939 Activity, unspecified: Secondary | ICD-10-CM | POA: Insufficient documentation

## 2013-03-14 DIAGNOSIS — G43909 Migraine, unspecified, not intractable, without status migrainosus: Secondary | ICD-10-CM | POA: Insufficient documentation

## 2013-03-14 DIAGNOSIS — S199XXA Unspecified injury of neck, initial encounter: Secondary | ICD-10-CM | POA: Insufficient documentation

## 2013-03-14 DIAGNOSIS — R296 Repeated falls: Secondary | ICD-10-CM | POA: Insufficient documentation

## 2013-03-14 DIAGNOSIS — Z3202 Encounter for pregnancy test, result negative: Secondary | ICD-10-CM | POA: Insufficient documentation

## 2013-03-14 DIAGNOSIS — R569 Unspecified convulsions: Secondary | ICD-10-CM | POA: Insufficient documentation

## 2013-03-14 LAB — BASIC METABOLIC PANEL
BUN: 8 mg/dL (ref 6–23)
CO2: 21 mEq/L (ref 19–32)
Calcium: 9.7 mg/dL (ref 8.4–10.5)
Creatinine, Ser: 0.9 mg/dL (ref 0.50–1.10)
Glucose, Bld: 89 mg/dL (ref 70–99)

## 2013-03-14 LAB — URINALYSIS, ROUTINE W REFLEX MICROSCOPIC
Bilirubin Urine: NEGATIVE
Glucose, UA: NEGATIVE mg/dL
Ketones, ur: NEGATIVE mg/dL
Protein, ur: 30 mg/dL — AB

## 2013-03-14 LAB — CBC WITH DIFFERENTIAL/PLATELET
Basophils Relative: 0 % (ref 0–1)
Eosinophils Relative: 2 % (ref 0–5)
HCT: 40.8 % (ref 36.0–46.0)
Lymphs Abs: 2.1 10*3/uL (ref 0.7–4.0)
MCH: 31.6 pg (ref 26.0–34.0)
MCV: 90.7 fL (ref 78.0–100.0)
Monocytes Absolute: 1.4 10*3/uL — ABNORMAL HIGH (ref 0.1–1.0)
Monocytes Relative: 6 % (ref 3–12)
Neutro Abs: 18.8 10*3/uL — ABNORMAL HIGH (ref 1.7–7.7)
RBC: 4.5 MIL/uL (ref 3.87–5.11)
WBC: 22.8 10*3/uL — ABNORMAL HIGH (ref 4.0–10.5)

## 2013-03-14 LAB — URINE MICROSCOPIC-ADD ON

## 2013-03-14 LAB — PREGNANCY, URINE: Preg Test, Ur: NEGATIVE

## 2013-03-14 MED ORDER — LORAZEPAM 1 MG PO TABS
1.0000 mg | ORAL_TABLET | Freq: Once | ORAL | Status: AC
  Administered 2013-03-14: 1 mg via ORAL
  Filled 2013-03-14: qty 1

## 2013-03-14 MED ORDER — KETOROLAC TROMETHAMINE 30 MG/ML IJ SOLN: 30.0000 mg | Freq: Once | INTRAMUSCULAR | Status: DC

## 2013-03-14 MED ORDER — OXYCODONE-ACETAMINOPHEN 5-325 MG PO TABS
1.0000 | ORAL_TABLET | Freq: Once | ORAL | Status: AC
  Administered 2013-03-14: 1 via ORAL
  Filled 2013-03-14 (×2): qty 1

## 2013-03-14 MED ORDER — IBUPROFEN 400 MG PO TABS
600.0000 mg | ORAL_TABLET | Freq: Once | ORAL | Status: AC
  Administered 2013-03-14: 600 mg via ORAL
  Filled 2013-03-14: qty 1
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
## 2013-03-14 NOTE — ED Notes (Signed)
Pt provided with cheese & crackers & ginger-ale. Tolerating well. States she feels better & ready to go home.

## 2013-03-14 NOTE — ED Provider Notes (Signed)
History     CSN: [id number]  Arrival date & time 03/14/13  2019   First MD Initiated Contact with Patient 03/14/13 2117      Chief Complaint  Patient presents with  . Seizures    (Consider location/radiation/quality/duration/timing/severity/associated sxs/prior treatment) HPI  37 year old female with prior history of traumatic brain injury from [facility] with status post seizure episodes (last seizure 1 year ago) presents for evaluation of seizure.  Patient reports she has been under a lot of stress due to not having a job, and does not have insurance. She has had a throbbing sharp headache to the forehead for the past several days. This is her typical headache. Today while she was laying in bed she had an apparent seizure episode lasting 5-10 minutes according to her roommate. Patient reports feeling confused afterward but returned to normal baseline. She went outside and subsequently had an obvious seizure episode. She reports she fell on the ground, but her tongue but denies any urinary or bowel incontinence. She reports feeling confused afterward. Currently complaining of pain to the tongue. She denies any alcohol or recreational drug use. No recent sickness. No change in medication. She was taking Lamictal but has discontinued for over a year due to inability to pay. She does not have a neurologist.  Past Medical History  Diagnosis Date  . Migraine   . Seizures   . Brain injury     Past Surgical History  Procedure Laterality Date  . Tubal ligation    . Traumatic brain injury      No family history on file.  History  Substance Use Topics  . Smoking status: Current Some Day Smoker -- 0.50 packs/day    Types: Cigarettes  . Smokeless tobacco: Not on file  . Alcohol Use: No    OB History   Grav Para Term Preterm Abortions TAB SAB Ect Mult Living                  Review of Systems  Constitutional:       10 Systems reviewed and all are negative for acute change except as  noted in the HPI.     Allergies  Cephalosporins; Penicillins; Erythromycin; Tramadol; and Vicodin  Home Medications   Current Outpatient Rx  Name  Route  Sig  Dispense  Refill  . amitriptyline (ELAVIL) 75 MG tablet   Oral   Take 75 mg by mouth at bedtime.         Marland Kitchen etodolac (LODINE) 500 MG tablet   Oral   Take 1 tablet (500 mg total) by mouth 2 (two) times daily.   20 tablet   0   . ibuprofen (ADVIL,MOTRIN) 800 MG tablet   Oral   Take 1 tablet (800 mg total) by mouth 3 (three) times daily.   21 tablet   0   . Metoclopramide HCl (REGLAN PO)   Oral   Take 1 tablet by mouth daily as needed. For nausea.         . metroNIDAZOLE (FLAGYL) 500 MG tablet   Oral   Take 1 tablet (500 mg total) by mouth 2 (two) times daily.   14 tablet   0   . oxyCODONE-acetaminophen (PERCOCET/ROXICET) 5-325 MG per tablet   Oral   Take 1 tablet by mouth every 8 (eight) hours as needed for pain.   10 tablet   0   . oxyCODONE-acetaminophen (PERCOCET/ROXICET) 5-325 MG per tablet   Oral   Take 2 tablets by  mouth every 4 (four) hours as needed for pain.   6 tablet   0     BP 118/74  Pulse 100  Temp(Src) 98 F (36.7 C) (Oral)  Resp 18  Wt 140 lb (63.504 kg)  BMI 26.47 kg/m2  SpO2 94%  Physical Exam  Nursing note and vitals reviewed. Constitutional: She is oriented to person, place, and time. She appears well-developed and well-nourished. No distress.  Awake, alert, nontoxic appearance  HENT:  Head: Atraumatic.  Well-healing surgical scar to left temporal scalp.  Bite mark to L side of tongue without deep laceration or active bleeding.   Eyes: Conjunctivae are normal. Right eye exhibits no discharge. Left eye exhibits no discharge.  Neck: Normal range of motion. Neck supple.  Cardiovascular: Normal rate, regular rhythm and intact distal pulses.   Pulmonary/Chest: Effort normal. No respiratory distress. She exhibits no tenderness.  Abdominal: Soft. There is no tenderness.  There is no rebound.  Musculoskeletal: She exhibits no edema and no tenderness.  ROM appears intact, no obvious focal weakness  Neurological: She is alert and oriented to person, place, and time. She has normal strength. No cranial nerve deficit or sensory deficit. She displays a negative Romberg sign. Coordination and gait normal. GCS eye subscore is 4. GCS verbal subscore is 5. GCS motor subscore is 6.  Mental status and motor strength appears intact  Normal gait.    Skin: No rash noted.  Psychiatric: She has a normal mood and affect.    ED Course  Procedures (including critical care time)  10:18 PM Patient with prior history of traumatic brain injury and prior seizure episode. She has not been taking any antiseizure medication for over a year. The seizure episodes today may be related to stress.  Will obtain basic lab, UA, pregnancy test. Ativan given. Will continue to monitor. Care discussed with attending.  11:13 PM Work up today is unremarkable.  Pt continues to endorse headache.  Sts percocet has helped in the past.  Denies allergy to percocet.  For tongue injury, recommend gargle with salt water and eat soft food.  Pt currently back to baseline.    Pt request prescription for dilantin as she has taken in the past.  Pt was taking dilantin 200mg  BID.  i will prescribed . Strongly encourage pt to f/u with neurologist for reevaluation and further management.  Pt voice understanding and agrees with plan.    Labs Reviewed  CBC WITH DIFFERENTIAL - Abnormal; Notable for the following:    WBC 22.8 (*)    Platelets 445 (*)    Neutrophils Relative 83 (*)    Lymphocytes Relative 9 (*)    Neutro Abs 18.8 (*)    Monocytes Absolute 1.4 (*)    All other components within normal limits  BASIC METABOLIC PANEL - Abnormal; Notable for the following:    Potassium 3.4 (*)    GFR calc non Af Amer 82 (*)    All other components within normal limits  URINALYSIS, ROUTINE W REFLEX MICROSCOPIC -  Abnormal; Notable for the following:    APPearance CLOUDY (*)    Hgb urine dipstick SMALL (*)    Protein, ur 30 (*)    All other components within normal limits  PREGNANCY, URINE  URINE MICROSCOPIC-ADD ON   No results found.   1. Seizure   2. Tongue injury, initial encounter       MDM  BP 122/75  Pulse 93  Temp(Src) 98 F (36.7 C) (Oral)  Resp  18  Wt 140 lb (63.504 kg)  BMI 26.47 kg/m2  SpO2 98%  I have reviewed nursing notes and vital signs.  I reviewed available ER/hospitalization records thought the EMR         Fayrene Helper, [place] 03/15/13 0002

## 2013-03-14 NOTE — ED Notes (Signed)
PA-C at bedside 

## 2013-03-15 MED ORDER — PHENYTOIN SODIUM EXTENDED 100 MG PO CAPS
200.0000 mg | ORAL_CAPSULE | Freq: Two times a day (BID) | ORAL | Status: DC
Start: 2013-03-15 — End: 2013-11-24

## 2013-03-15 MED ORDER — OXYCODONE-ACETAMINOPHEN 5-325 MG PO TABS: 1.0000 | ORAL_TABLET | Freq: Four times a day (QID) | ORAL | Status: DC | PRN

## 2013-03-15 NOTE — ED Provider Notes (Signed)
Medical screening examination/treatment/procedure(s) were performed by non-physician practitioner and as supervising physician I was immediately available for consultation/collaboration.  Ethelda Chick, MD 03/15/13 [phone]

## 2013-04-13 ENCOUNTER — Encounter (HOSPITAL_BASED_OUTPATIENT_CLINIC_OR_DEPARTMENT_OTHER): Payer: Self-pay | Admitting: *Deleted

## 2013-04-13 ENCOUNTER — Emergency Department (HOSPITAL_BASED_OUTPATIENT_CLINIC_OR_DEPARTMENT_OTHER)
Admission: EM | Admit: 2013-04-13 | Discharge: 2013-04-13 | Disposition: A | Discharge status: Home / Self Care | Payer: Self-pay | Attending: Emergency Medicine | Admitting: Emergency Medicine

## 2013-04-13 ENCOUNTER — Emergency Department (HOSPITAL_BASED_OUTPATIENT_CLINIC_OR_DEPARTMENT_OTHER): Payer: Self-pay

## 2013-04-13 DIAGNOSIS — S59909A Unspecified injury of unspecified elbow, initial encounter: Secondary | ICD-10-CM | POA: Insufficient documentation

## 2013-04-13 DIAGNOSIS — Y9389 Activity, other specified: Secondary | ICD-10-CM | POA: Insufficient documentation

## 2013-04-13 DIAGNOSIS — Z87820 Personal history of traumatic brain injury: Secondary | ICD-10-CM | POA: Insufficient documentation

## 2013-04-13 DIAGNOSIS — G40909 Epilepsy, unspecified, not intractable, without status epilepticus: Secondary | ICD-10-CM | POA: Insufficient documentation

## 2013-04-13 DIAGNOSIS — F17200 Nicotine dependence, unspecified, uncomplicated: Secondary | ICD-10-CM | POA: Insufficient documentation

## 2013-04-13 DIAGNOSIS — M25521 Pain in right elbow: Secondary | ICD-10-CM

## 2013-04-13 DIAGNOSIS — Y9289 Other specified places as the place of occurrence of the external cause: Secondary | ICD-10-CM | POA: Insufficient documentation

## 2013-04-13 DIAGNOSIS — S6990XA Unspecified injury of unspecified wrist, hand and finger(s), initial encounter: Secondary | ICD-10-CM | POA: Insufficient documentation

## 2013-04-13 DIAGNOSIS — W010XXA Fall on same level from slipping, tripping and stumbling without subsequent striking against object, initial encounter: Secondary | ICD-10-CM | POA: Insufficient documentation

## 2013-04-13 DIAGNOSIS — Z880 Allergy status to penicillin: Secondary | ICD-10-CM | POA: Insufficient documentation

## 2013-04-13 DIAGNOSIS — Z79899 Other long term (current) drug therapy: Secondary | ICD-10-CM | POA: Insufficient documentation

## 2013-04-13 DIAGNOSIS — G43909 Migraine, unspecified, not intractable, without status migrainosus: Secondary | ICD-10-CM | POA: Insufficient documentation

## 2013-04-13 MED ORDER — IBUPROFEN 800 MG PO TABS: 800.0000 mg | ORAL_TABLET | Freq: Three times a day (TID) | ORAL | Status: DC

## 2013-04-13 NOTE — ED Provider Notes (Signed)
History     CSN: [id number]  Arrival date & time 04/13/13  1742   First MD Initiated Contact with Patient 04/13/13 1829      Chief Complaint  Patient presents with  . Arm Injury    (Consider location/radiation/quality/duration/timing/severity/associated sxs/prior treatment) Patient is a 37 y.o. female presenting with arm injury. The history is provided by the patient. No language interpreter was used.  Arm Injury Location:  Elbow Injury: yes   Mechanism of injury: fall   Fall:    Fall occurred:  Tripped   Impact surface:  Hard floor   Point of impact: elbow. Elbow location:  R elbow Pain details:    Quality:  Aching   Radiates to:  Does not radiate   Severity:  Moderate   Onset quality:  Sudden   Timing:  Constant   Progression:  Unchanged Chronicity:  New   Past Medical History  Diagnosis Date  . Migraine   . Seizures   . Brain injury     Past Surgical History  Procedure Laterality Date  . Tubal ligation    . Traumatic brain injury      History reviewed. No pertinent family history.  History  Substance Use Topics  . Smoking status: Current Some Day Smoker -- 0.50 packs/day    Types: Cigarettes  . Smokeless tobacco: Not on file  . Alcohol Use: No    OB History   Grav Para Term Preterm Abortions TAB SAB Ect Mult Living                  Review of Systems  Constitutional: Negative.   Respiratory: Negative.   Cardiovascular: Negative.     Allergies  Cephalosporins; Penicillins; Erythromycin; Tramadol; and Vicodin  Home Medications   Current Outpatient Rx  Name  Route  Sig  Dispense  Refill  . citalopram (CELEXA) 20 MG tablet   Oral   Take 20 mg by mouth daily.         . traZODone (DESYREL) 150 MG tablet   Oral   Take 150 mg by mouth at bedtime.         Marland Kitchen amitriptyline (ELAVIL) 75 MG tablet   Oral   Take 75 mg by mouth at bedtime.         Marland Kitchen etodolac (LODINE) 500 MG tablet   Oral   Take 1 tablet (500 mg total) by mouth 2 (two)  times daily.   20 tablet   0   . ibuprofen (ADVIL,MOTRIN) 800 MG tablet   Oral   Take 1 tablet (800 mg total) by mouth 3 (three) times daily.   21 tablet   0   . ibuprofen (ADVIL,MOTRIN) 800 MG tablet   Oral   Take 1 tablet (800 mg total) by mouth 3 (three) times daily.   21 tablet   0   . Metoclopramide HCl (REGLAN PO)   Oral   Take 1 tablet by mouth daily as needed. For nausea.         . metroNIDAZOLE (FLAGYL) 500 MG tablet   Oral   Take 1 tablet (500 mg total) by mouth 2 (two) times daily.   14 tablet   0   . oxyCODONE-acetaminophen (PERCOCET/ROXICET) 5-325 MG per tablet   Oral   Take 1-2 tablets by mouth every 6 (six) hours as needed for pain.   12 tablet   0   . phenytoin (DILANTIN) 100 MG ER capsule   Oral   Take 2 capsules (  200 mg total) by mouth 2 (two) times daily.   30 capsule   1     Pulse 112  Temp(Src) 99.2 F (37.3 C) (Oral)  Resp 16  Ht 5' (1.524 m)  Wt 140 lb (63.504 kg)  BMI 27.34 kg/m2  SpO2 98%  LMP 03/16/2013  Physical Exam  Nursing note and vitals reviewed. Constitutional: She is oriented to person, place, and time. She appears well-developed and well-nourished.  Cardiovascular: Normal rate and regular rhythm.   Pulmonary/Chest: Effort normal and breath sounds normal.  Musculoskeletal: Normal range of motion.  Pt has generalized tenderness to the right elbow:pt has full rom:no gross deformity of swelling noted to the area  Neurological: She is alert and oriented to person, place, and time.  Skin: Skin is warm and dry.    ED Course  Procedures (including critical care time)  Labs Reviewed - No data to display Dg Elbow Complete Right  04/13/2013   *RADIOLOGY REPORT*  Clinical Data: Larey Seat today with pain  RIGHT ELBOW - COMPLETE 3+ VIEW  Comparison: None.  Findings: No acute fracture is seen.  Alignment is normal.  No joint effusion is seen.  IMPRESSION: Negative.   Original Report Authenticated By: Dwyane Dee, M.D.     1. Elbow  pain, right       MDM  Pt requesting number for pcp        Teressa Lower, NP 04/13/13 1844

## 2013-04-13 NOTE — ED Notes (Signed)
Pt c/o fall on right elbow x 1 day ago

## 2013-04-13 NOTE — ED Provider Notes (Signed)
Medical screening examination/treatment/procedure(s) were performed by non-physician practitioner and as supervising physician I was immediately available for consultation/collaboration.   Lyal Husted, MD 04/13/13 2354 

## 2013-04-24 ENCOUNTER — Encounter (HOSPITAL_BASED_OUTPATIENT_CLINIC_OR_DEPARTMENT_OTHER): Payer: Self-pay

## 2013-04-24 ENCOUNTER — Emergency Department (HOSPITAL_BASED_OUTPATIENT_CLINIC_OR_DEPARTMENT_OTHER)
Admission: EM | Admit: 2013-04-24 | Discharge: 2013-04-24 | Disposition: A | Discharge status: Home / Self Care | Payer: Self-pay | Attending: Emergency Medicine | Admitting: Emergency Medicine

## 2013-04-24 DIAGNOSIS — Z880 Allergy status to penicillin: Secondary | ICD-10-CM | POA: Insufficient documentation

## 2013-04-24 DIAGNOSIS — E119 Type 2 diabetes mellitus without complications: Secondary | ICD-10-CM | POA: Insufficient documentation

## 2013-04-24 DIAGNOSIS — G43909 Migraine, unspecified, not intractable, without status migrainosus: Secondary | ICD-10-CM | POA: Insufficient documentation

## 2013-04-24 DIAGNOSIS — M79609 Pain in unspecified limb: Secondary | ICD-10-CM | POA: Insufficient documentation

## 2013-04-24 DIAGNOSIS — Z87820 Personal history of traumatic brain injury: Secondary | ICD-10-CM | POA: Insufficient documentation

## 2013-04-24 DIAGNOSIS — G40909 Epilepsy, unspecified, not intractable, without status epilepticus: Secondary | ICD-10-CM | POA: Insufficient documentation

## 2013-04-24 DIAGNOSIS — F17200 Nicotine dependence, unspecified, uncomplicated: Secondary | ICD-10-CM | POA: Insufficient documentation

## 2013-04-24 DIAGNOSIS — L089 Local infection of the skin and subcutaneous tissue, unspecified: Secondary | ICD-10-CM | POA: Insufficient documentation

## 2013-04-24 DIAGNOSIS — M79622 Pain in left upper arm: Secondary | ICD-10-CM

## 2013-04-24 DIAGNOSIS — IMO0002 Reserved for concepts with insufficient information to code with codable children: Secondary | ICD-10-CM | POA: Insufficient documentation

## 2013-04-24 DIAGNOSIS — Z79899 Other long term (current) drug therapy: Secondary | ICD-10-CM | POA: Insufficient documentation

## 2013-04-24 MED ORDER — DOXYCYCLINE HYCLATE 100 MG PO CAPS: 100.0000 mg | ORAL_CAPSULE | Freq: Two times a day (BID) | ORAL | Status: DC

## 2013-04-24 MED ORDER — OXYCODONE-ACETAMINOPHEN 5-325 MG PO TABS
ORAL_TABLET | ORAL | Status: AC
  Administered 2013-04-24: 1
  Filled 2013-04-24: qty 1

## 2013-04-24 MED ORDER — OXYCODONE-ACETAMINOPHEN 5-325 MG PO TABS: 1.0000 | ORAL_TABLET | Freq: Four times a day (QID) | ORAL | Status: DC | PRN

## 2013-04-24 MED ORDER — IBUPROFEN 400 MG PO TABS: 600.0000 mg | ORAL_TABLET | Freq: Once | ORAL | Status: DC

## 2013-04-24 NOTE — ED Notes (Signed)
Painful abscess L axcillary area 8/10 pain reported

## 2013-04-24 NOTE — ED Provider Notes (Signed)
History     CSN: [id number]  Arrival date & time 04/24/13  2200   First MD Initiated Contact with Patient 04/24/13 2305      Chief Complaint  Patient presents with  . Abscess    (Consider location/radiation/quality/duration/timing/severity/associated sxs/prior treatment) Patient is a 37 y.o. female presenting with abscess. The history is provided by the patient.  Abscess Associated symptoms: no fever, no nausea and no vomiting   pt c/o area soreness left axilla for past couple days. Hx prior abscess. No fever or chills. Does not feel ill or sick. No nv. No injury to area. No bite or sting. Mod, constant, dull pain to area worse w palpation.     Past Medical History  Diagnosis Date  . Migraine   . Seizures   . Brain injury   . Diabetes mellitus without complication     Past Surgical History  Procedure Laterality Date  . Tubal ligation    . Traumatic brain injury      No family history on file.  History  Substance Use Topics  . Smoking status: Current Some Day Smoker -- 0.50 packs/day    Types: Cigarettes  . Smokeless tobacco: Not on file  . Alcohol Use: No    OB History   Grav Para Term Preterm Abortions TAB SAB Ect Mult Living                  Review of Systems  Constitutional: Negative for fever and chills.  Gastrointestinal: Negative for nausea and vomiting.  Skin: Negative for rash.    Allergies  Cephalosporins; Penicillins; Erythromycin; Tramadol; and Vicodin  Home Medications   Current Outpatient Rx  Name  Route  Sig  Dispense  Refill  . amitriptyline (ELAVIL) 75 MG tablet   Oral   Take 75 mg by mouth at bedtime.         . citalopram (CELEXA) 20 MG tablet   Oral   Take 20 mg by mouth daily.         Marland Kitchen ibuprofen (ADVIL,MOTRIN) 800 MG tablet   Oral   Take 1 tablet (800 mg total) by mouth 3 (three) times daily.   21 tablet   0   . ibuprofen (ADVIL,MOTRIN) 800 MG tablet   Oral   Take 1 tablet (800 mg total) by mouth 3 (three) times  daily.   21 tablet   0   . Metoclopramide HCl (REGLAN PO)   Oral   Take 1 tablet by mouth daily as needed. For nausea.         . phenytoin (DILANTIN) 100 MG ER capsule   Oral   Take 2 capsules (200 mg total) by mouth 2 (two) times daily.   30 capsule   1   . traZODone (DESYREL) 150 MG tablet   Oral   Take 150 mg by mouth at bedtime.           BP 128/89  Pulse 88  Temp(Src) 98.8 F (37.1 C) (Oral)  Resp 16  Wt 140 lb (63.504 kg)  BMI 27.34 kg/m2  SpO2 100%  LMP 04/15/2013  Physical Exam  Nursing note and vitals reviewed. Constitutional: She appears well-developed and well-nourished. No distress.  Eyes: Conjunctivae are normal. No scleral icterus.  Neck: Neck supple. No tracheal deviation present.  Cardiovascular: Normal rate.   Pulmonary/Chest: Effort normal. No respiratory distress.  Abdominal: Normal appearance. She exhibits no distension.  Musculoskeletal: She exhibits no edema.  Lymphadenopathy:    She has  no cervical adenopathy.  Neurological: She is alert.  Skin: Skin is warm and dry. No rash noted. She is not diaphoretic.  Area minimal erythema/induration to left axilla, 2 mm by 4 mm. No fluctuance/drainable abscess. No masses.   Psychiatric: She has a normal mood and affect.    ED Course  Procedures (including critical care time)    MDM  Currently no area of fluctuance/abscess. Minimal (couple mm) area mild erythema/induration, ?early infection/developing abscess.  Will rx warm compresses, doxy, pain rx, discussed if fails to resolve, or swelling increases/abscess develops, may need I and D.  Reviewed nursing notes and prior charts for additional history.    Motrin po.         Suzi Roots, MD 04/24/13 [phone]

## 2013-04-24 NOTE — ED Notes (Signed)
Patient here with complaint of abscess to left axilla x 3 days, reports pain to same and feels as if it busted on the inside, with pain up to neck

## 2013-04-25 ENCOUNTER — Telehealth (HOSPITAL_COMMUNITY): Payer: Self-pay | Admitting: Emergency Medicine

## 2013-04-25 NOTE — Progress Notes (Signed)
MATCH letter provided and faxed to patient's pharmacy for antibiotic.  Fax confirmation received on 06/09 at 1929pm.

## 2013-04-25 NOTE — Progress Notes (Signed)
[facility] ED CM consulted by [facility] flow manager toni to assist this self pay patient seen and d/c at [facility] [place] on 04/23/13 Pt reports not being able to afford doxycycline Rx Spoke with pt who reports [facility] [address] [place] and informed the medication would cost her $40 CM spoke with the pt about [facility] MATCH program ($3 co pay for each Rx through [facility] program, 7 day expiration of MATCH letter and choice of pharmacies) Pt agreed to receive assistance from program CM reviewed EPIC notes and chart review information    Pt is eligible for [facility] MATCH program (unable to find pt listed in PDMI per cardholder name inquiry) Confirmed with Katrina at [facility] on [address] that they are not a Sports coach with Vishel at [employer] [phone] to confirm they are a participating [employer].  CM spoke with pt to updated her on participating pharmacy not including [facility] [address] in [place] for $3 per Rx Voiced understanding MATCH letter does not coverage narcotic medicine

## 2013-04-26 NOTE — Progress Notes (Signed)
ED Cm received a voice message x 2 (at [phone] and [phone] 04/26/13) from the pt stating she went to pharmacy without her Rx today, 04/26/13 and that the pharmacy staff states [facility] is not working today and they are not familiar with her medicine Pt requested a return call CM called [phone] and spoke to Phoenicia who states pt doxycycline is at the [facility] pharmacy Cm returned call to pt at [phone] to inform her that her medication is at [facility] ([address], [place], [place]) ready per [employer] for pickup. Reminded pt she did need her Rx to obtain the medicine. Left CM office number for a return call
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
## 2013-04-27 ENCOUNTER — Emergency Department (HOSPITAL_BASED_OUTPATIENT_CLINIC_OR_DEPARTMENT_OTHER)
Admission: EM | Admit: 2013-04-27 | Discharge: 2013-04-27 | Disposition: A | Discharge status: Home / Self Care | Payer: Self-pay | Attending: Emergency Medicine | Admitting: Emergency Medicine

## 2013-04-27 ENCOUNTER — Encounter (HOSPITAL_BASED_OUTPATIENT_CLINIC_OR_DEPARTMENT_OTHER): Payer: Self-pay | Admitting: *Deleted

## 2013-04-27 DIAGNOSIS — Z87820 Personal history of traumatic brain injury: Secondary | ICD-10-CM | POA: Insufficient documentation

## 2013-04-27 DIAGNOSIS — Z880 Allergy status to penicillin: Secondary | ICD-10-CM | POA: Insufficient documentation

## 2013-04-27 DIAGNOSIS — F17200 Nicotine dependence, unspecified, uncomplicated: Secondary | ICD-10-CM | POA: Insufficient documentation

## 2013-04-27 DIAGNOSIS — G43909 Migraine, unspecified, not intractable, without status migrainosus: Secondary | ICD-10-CM | POA: Insufficient documentation

## 2013-04-27 DIAGNOSIS — IMO0002 Reserved for concepts with insufficient information to code with codable children: Secondary | ICD-10-CM | POA: Insufficient documentation

## 2013-04-27 DIAGNOSIS — L02412 Cutaneous abscess of left axilla: Secondary | ICD-10-CM

## 2013-04-27 DIAGNOSIS — G40909 Epilepsy, unspecified, not intractable, without status epilepticus: Secondary | ICD-10-CM | POA: Insufficient documentation

## 2013-04-27 DIAGNOSIS — E119 Type 2 diabetes mellitus without complications: Secondary | ICD-10-CM | POA: Insufficient documentation

## 2013-04-27 DIAGNOSIS — Z79899 Other long term (current) drug therapy: Secondary | ICD-10-CM | POA: Insufficient documentation

## 2013-04-27 MED ORDER — LIDOCAINE HCL 2 % IJ SOLN
INTRAMUSCULAR | Status: AC
  Filled 2013-04-27: qty 20

## 2013-04-27 MED ORDER — OXYCODONE-ACETAMINOPHEN 5-325 MG PO TABS: 1.0000 | ORAL_TABLET | Freq: Once | ORAL | Status: DC

## 2013-04-27 MED ORDER — OXYCODONE-ACETAMINOPHEN 5-325 MG PO TABS
2.0000 | ORAL_TABLET | Freq: Once | ORAL | Status: AC
  Administered 2013-04-27: 2 via ORAL
  Filled 2013-04-27 (×2): qty 2

## 2013-04-27 NOTE — ED Notes (Signed)
Pt reports abscess to left axillary x 6 days

## 2013-04-27 NOTE — ED Provider Notes (Signed)
Medical screening examination/treatment/procedure(s) were performed by non-physician practitioner and as supervising physician I was immediately available for consultation/collaboration.   Adrijana Haros, MD 04/27/13 2325 

## 2013-04-27 NOTE — ED Provider Notes (Signed)
History     CSN: [id number]  Arrival date & time 04/27/13  1405   First MD Initiated Contact with Patient 04/27/13 1533      Chief Complaint  Patient presents with  . Abscess    (Consider location/radiation/quality/duration/timing/severity/associated sxs/prior treatment) Patient is a 37 y.o. female presenting with abscess. The history is provided by the patient. No language interpreter was used.  Abscess Location:  Shoulder/arm Shoulder/arm abscess location:  L axilla Abscess quality: induration, painful and redness   Abscess quality: not draining   Red streaking: no   Associated symptoms: no fever   Associated symptoms comment:  Worsening pain and redness to left axilla after initial evaluation 04/24/13. No fever.    Past Medical History  Diagnosis Date  . Migraine   . Seizures   . Brain injury   . Diabetes mellitus without complication     Past Surgical History  Procedure Laterality Date  . Tubal ligation    . Traumatic brain injury      History reviewed. No pertinent family history.  History  Substance Use Topics  . Smoking status: Current Some Day Smoker -- 0.50 packs/day    Types: Cigarettes  . Smokeless tobacco: Not on file  . Alcohol Use: No    OB History   Grav Para Term Preterm Abortions TAB SAB Ect Mult Living                  Review of Systems  Constitutional: Negative for fever.  Gastrointestinal: Negative.   Musculoskeletal: Negative.   Skin:       See HPI.    Allergies  Cephalosporins; Penicillins; Erythromycin; Tramadol; and Vicodin  Home Medications   Current Outpatient Rx  Name  Route  Sig  Dispense  Refill  . amitriptyline (ELAVIL) 75 MG tablet   Oral   Take 75 mg by mouth at bedtime.         . citalopram (CELEXA) 20 MG tablet   Oral   Take 20 mg by mouth daily.         Marland Kitchen doxycycline (VIBRAMYCIN) 100 MG capsule   Oral   Take 1 capsule (100 mg total) by mouth 2 (two) times daily.   14 capsule   0   . ibuprofen  (ADVIL,MOTRIN) 800 MG tablet   Oral   Take 1 tablet (800 mg total) by mouth 3 (three) times daily.   21 tablet   0   . ibuprofen (ADVIL,MOTRIN) 800 MG tablet   Oral   Take 1 tablet (800 mg total) by mouth 3 (three) times daily.   21 tablet   0   . Metoclopramide HCl (REGLAN PO)   Oral   Take 1 tablet by mouth daily as needed. For nausea.         Marland Kitchen oxyCODONE-acetaminophen (PERCOCET/ROXICET) 5-325 MG per tablet   Oral   Take 1-2 tablets by mouth every 6 (six) hours as needed for pain.   15 tablet   0   . phenytoin (DILANTIN) 100 MG ER capsule   Oral   Take 2 capsules (200 mg total) by mouth 2 (two) times daily.   30 capsule   1   . traZODone (DESYREL) 150 MG tablet   Oral   Take 150 mg by mouth at bedtime.           BP 119/69  Pulse 98  Temp(Src) 98.3 F (36.8 C)  Resp 16  Ht 5' (1.524 m)  Wt 140 lb (63.504  kg)  BMI 27.34 kg/m2  SpO2 99%  LMP 04/15/2013  Physical Exam  Constitutional: She is oriented to person, place, and time. She appears well-developed and well-nourished. No distress.  Neurological: She is alert and oriented to person, place, and time.  Skin: Skin is warm and dry.  2cm x 3cm indurated area that is significantly tender to left axilla. No active drainage. Second area of swelling that is separate and medial to first measuring 3cm x 3cm with induration extending beyond axillary midline without redness.   Psychiatric: She has a normal mood and affect.    ED Course  Procedures (including critical care time)  Labs Reviewed - No data to display No results found.   No diagnosis found.  1. Cutaneous abscess, left axilla x 2  MDM  INCISION AND DRAINAGE Performed by: Elpidio Anis A Consent: Verbal consent obtained. Risks and benefits: risks, benefits and alternatives were discussed Type: abscess  Body area: left axilla  Anesthesia: local infiltration  Incision was made with a scalpel.  Local anesthetic: lidocaine 2% w/o  epinephrine  Anesthetic total: 2 ml  Complexity: complex Blunt dissection to break up loculations  Drainage: purulent  Drainage amount: moderate  Packing material: 1/4 in iodoform gauze  Patient tolerance: Patient tolerated the procedure well with no immediate complications.   INCISION AND DRAINAGE Performed by: Elpidio Anis A Consent: Verbal consent obtained. Risks and benefits: risks, benefits and alternatives were discussed Type: abscess  Body area: left axilla - 2nd site  Anesthesia: local infiltration  Incision was made with a scalpel.  Local anesthetic: lidocaine 2% w/o epinephrine  Anesthetic total: 2 ml  Complexity: complex Blunt dissection to break up loculations  Drainage: purulent  Drainage amount:moderate  Packing material: 1/4 in iodoform gauze  Patient tolerance: Patient tolerated the procedure well with no immediate complications.    Two sites I&D performed given area of induration and two main rises with central fluctuance.         Arnoldo Hooker, PA-C 04/27/13 1611  Arnoldo Hooker, PA-C 04/27/13 1817

## 2013-07-22 ENCOUNTER — Emergency Department (HOSPITAL_BASED_OUTPATIENT_CLINIC_OR_DEPARTMENT_OTHER)
Admission: EM | Admit: 2013-07-22 | Discharge: 2013-07-22 | Disposition: A | Discharge status: Home / Self Care | Payer: No Typology Code available for payment source | Attending: Emergency Medicine | Admitting: Emergency Medicine

## 2013-07-22 ENCOUNTER — Encounter (HOSPITAL_BASED_OUTPATIENT_CLINIC_OR_DEPARTMENT_OTHER): Payer: Self-pay

## 2013-07-22 ENCOUNTER — Emergency Department (HOSPITAL_BASED_OUTPATIENT_CLINIC_OR_DEPARTMENT_OTHER): Payer: No Typology Code available for payment source

## 2013-07-22 DIAGNOSIS — E119 Type 2 diabetes mellitus without complications: Secondary | ICD-10-CM | POA: Insufficient documentation

## 2013-07-22 DIAGNOSIS — G43909 Migraine, unspecified, not intractable, without status migrainosus: Secondary | ICD-10-CM | POA: Insufficient documentation

## 2013-07-22 DIAGNOSIS — R000 Tachycardia, unspecified: Secondary | ICD-10-CM | POA: Insufficient documentation

## 2013-07-22 DIAGNOSIS — F17200 Nicotine dependence, unspecified, uncomplicated: Secondary | ICD-10-CM | POA: Insufficient documentation

## 2013-07-22 DIAGNOSIS — Z880 Allergy status to penicillin: Secondary | ICD-10-CM | POA: Insufficient documentation

## 2013-07-22 DIAGNOSIS — Z792 Long term (current) use of antibiotics: Secondary | ICD-10-CM | POA: Insufficient documentation

## 2013-07-22 DIAGNOSIS — R0789 Other chest pain: Secondary | ICD-10-CM | POA: Insufficient documentation

## 2013-07-22 DIAGNOSIS — J189 Pneumonia, unspecified organism: Secondary | ICD-10-CM

## 2013-07-22 DIAGNOSIS — M2550 Pain in unspecified joint: Secondary | ICD-10-CM

## 2013-07-22 DIAGNOSIS — J159 Unspecified bacterial pneumonia: Secondary | ICD-10-CM | POA: Insufficient documentation

## 2013-07-22 DIAGNOSIS — Y929 Unspecified place or not applicable: Secondary | ICD-10-CM | POA: Insufficient documentation

## 2013-07-22 DIAGNOSIS — S4980XA Other specified injuries of shoulder and upper arm, unspecified arm, initial encounter: Secondary | ICD-10-CM | POA: Insufficient documentation

## 2013-07-22 DIAGNOSIS — Z87820 Personal history of traumatic brain injury: Secondary | ICD-10-CM | POA: Insufficient documentation

## 2013-07-22 DIAGNOSIS — IMO0002 Reserved for concepts with insufficient information to code with codable children: Secondary | ICD-10-CM | POA: Insufficient documentation

## 2013-07-22 DIAGNOSIS — S46909A Unspecified injury of unspecified muscle, fascia and tendon at shoulder and upper arm level, unspecified arm, initial encounter: Secondary | ICD-10-CM | POA: Insufficient documentation

## 2013-07-22 DIAGNOSIS — Y939 Activity, unspecified: Secondary | ICD-10-CM | POA: Insufficient documentation

## 2013-07-22 DIAGNOSIS — J45901 Unspecified asthma with (acute) exacerbation: Secondary | ICD-10-CM | POA: Insufficient documentation

## 2013-07-22 DIAGNOSIS — Z79899 Other long term (current) drug therapy: Secondary | ICD-10-CM | POA: Insufficient documentation

## 2013-07-22 DIAGNOSIS — Z791 Long term (current) use of non-steroidal anti-inflammatories (NSAID): Secondary | ICD-10-CM | POA: Insufficient documentation

## 2013-07-22 DIAGNOSIS — S4992XA Unspecified injury of left shoulder and upper arm, initial encounter: Secondary | ICD-10-CM

## 2013-07-22 DIAGNOSIS — G40909 Epilepsy, unspecified, not intractable, without status epilepticus: Secondary | ICD-10-CM | POA: Insufficient documentation

## 2013-07-22 HISTORY — DX: Unspecified asthma, uncomplicated: J45.909

## 2013-07-22 HISTORY — DX: Unspecified chronic bronchitis: J42

## 2013-07-22 MED ORDER — LEVALBUTEROL HCL 1.25 MG/0.5ML IN NEBU: 1.2500 mg | INHALATION_SOLUTION | Freq: Once | RESPIRATORY_TRACT | Status: DC

## 2013-07-22 MED ORDER — LEVOFLOXACIN 750 MG PO TABS: 750.0000 mg | ORAL_TABLET | Freq: Every day | ORAL | Status: DC

## 2013-07-22 MED ORDER — PREDNISONE 50 MG PO TABS
60.0000 mg | ORAL_TABLET | Freq: Once | ORAL | Status: AC
  Administered 2013-07-22: 60 mg via ORAL
  Filled 2013-07-22: qty 1

## 2013-07-22 MED ORDER — ALBUTEROL SULFATE (5 MG/ML) 0.5% IN NEBU
5.0000 mg | INHALATION_SOLUTION | Freq: Once | RESPIRATORY_TRACT | Status: DC
  Filled 2013-07-22 (×2): qty 0.5

## 2013-07-22 MED ORDER — PREDNISONE 20 MG PO TABS: 40.0000 mg | ORAL_TABLET | Freq: Every day | ORAL | Status: DC

## 2013-07-22 MED ORDER — LEVALBUTEROL HCL 1.25 MG/0.5ML IN NEBU
INHALATION_SOLUTION | RESPIRATORY_TRACT | Status: AC
  Administered 2013-07-22: 1.25 mg
  Filled 2013-07-22: qty 1

## 2013-07-22 MED ORDER — LEVALBUTEROL HCL 1.25 MG/3ML IN NEBU
1.2500 mg | INHALATION_SOLUTION | Freq: Once | RESPIRATORY_TRACT | Status: AC
  Administered 2013-07-22: 1.25 mg via RESPIRATORY_TRACT

## 2013-07-22 MED ORDER — OXYCODONE-ACETAMINOPHEN 5-325 MG PO TABS
2.0000 | ORAL_TABLET | Freq: Once | ORAL | Status: AC
  Administered 2013-07-22: 2 via ORAL
  Filled 2013-07-22 (×2): qty 2

## 2013-07-22 MED ORDER — METHYLPREDNISOLONE SODIUM SUCC 125 MG IJ SOLR: 125.0000 mg | Freq: Once | INTRAMUSCULAR | Status: DC

## 2013-07-22 MED ORDER — METHYLPREDNISOLONE SODIUM SUCC 125 MG IJ SOLR
125.0000 mg | Freq: Once | INTRAMUSCULAR | Status: DC
  Filled 2013-07-22: qty 2

## 2013-07-22 MED ORDER — IPRATROPIUM BROMIDE 0.02 % IN SOLN
0.5000 mg | Freq: Once | RESPIRATORY_TRACT | Status: AC
Start: 2013-07-22 — End: 2013-07-22
  Administered 2013-07-22: 0.5 mg via RESPIRATORY_TRACT
  Filled 2013-07-22: qty 2.5

## 2013-07-22 MED ORDER — LEVOFLOXACIN 750 MG PO TABS
750.0000 mg | ORAL_TABLET | Freq: Once | ORAL | Status: AC
  Administered 2013-07-22: 750 mg via ORAL
  Filled 2013-07-22: qty 1

## 2013-07-22 MED ORDER — OXYCODONE-ACETAMINOPHEN 5-325 MG PO TABS: 1.0000 | ORAL_TABLET | ORAL | Status: DC | PRN

## 2013-07-22 MED ORDER — AZITHROMYCIN 250 MG PO TABS: 250.0000 mg | ORAL_TABLET | Freq: Every day | ORAL | Status: DC

## 2013-07-22 MED ORDER — ALBUTEROL SULFATE HFA 108 (90 BASE) MCG/ACT IN AERS
INHALATION_SPRAY | RESPIRATORY_TRACT | Status: AC
  Administered 2013-07-22: 16:00:00
  Filled 2013-07-22: qty 6.7

## 2013-07-22 NOTE — ED Provider Notes (Signed)
CSN: [id number]     Arrival date & time 07/22/13  1232 History   First MD Initiated Contact with Patient 07/22/13 1330     Chief Complaint  Patient presents with  . Cough   (Consider location/radiation/quality/duration/timing/severity/associated sxs/prior Treatment) The history is provided by the patient and medical records. No language interpreter was used.    Christine Campbell is a 37 y.o. female  with a hx of asthma, seizures, TBI, hypoglycemia (not Dm as noted in the chart), chronic bronchitis presents to the Emergency Department complaining of gradual, persistent, progressively worsening SOB  Onset 2 days ago.  Pt reports that she has not been able to afford her inhalers and therefore has not been using them after she ran out.  She ran out of her albuterol last night, but reports that she felt like it wasnt working yesterday.  Associated symptoms include burning sensation in her chest which she treats, chest pain when she coughs, shortness of breath.  Nothing makes it better and exertion makes it worse.  Pt denies measured fever, abd pain, N/V/D, weakness, syncope, dysuria, hematuria.    Pt states she also had a seizure 3 days ago. She reports a hx of seizures from a TBI.  She has not been taking her dilantin and believes that a lot of recent family stress has been causing the seizures. She reports that she hit her shoulder after the seizure 3 days ago.  She denies hitting her head, neck pain or back pain.  Associated symptoms include pain in the left shoulder after the seizure without swelling of bruising.     Past Medical History  Diagnosis Date  . Migraine   . Seizures   . Brain injury   . Diabetes mellitus without complication   . Asthma   . Bronchitis, chronic    Past Surgical History  Procedure Laterality Date  . Tubal ligation    . Traumatic brain injury     No family history on file. History  Substance Use Topics  . Smoking status: Current Every Day Smoker -- 0.50 packs/day     Types: Cigarettes  . Smokeless tobacco: Not on file  . Alcohol Use: No   OB History   Grav Para Term Preterm Abortions TAB SAB Ect Mult Living                 Review of Systems  Constitutional: Negative for fever, diaphoresis, appetite change, fatigue and unexpected weight change.  HENT: Negative for mouth sores, neck pain and neck stiffness.   Eyes: Negative for visual disturbance.  Respiratory: Positive for cough, chest tightness, shortness of breath and wheezing.   Cardiovascular: Positive for chest pain.  Gastrointestinal: Negative for nausea, vomiting, abdominal pain, diarrhea and constipation.  Endocrine: Negative for polydipsia, polyphagia and polyuria.  Genitourinary: Negative for dysuria, urgency, frequency and hematuria.  Musculoskeletal: Positive for arthralgias. Negative for back pain.  Skin: Negative for rash.  Allergic/Immunologic: Negative for immunocompromised state.  Neurological: Negative for syncope, light-headedness and headaches.  Hematological: Does not bruise/bleed easily.  Psychiatric/Behavioral: Negative for sleep disturbance. The patient is not nervous/anxious.     Allergies  Cephalosporins; Penicillins; Erythromycin; Tramadol; and Vicodin  Home Medications   Current Outpatient Rx  Name  Route  Sig  Dispense  Refill  . amitriptyline (ELAVIL) 75 MG tablet   Oral   Take 75 mg by mouth at bedtime.         . citalopram (CELEXA) 20 MG tablet   Oral  Take 20 mg by mouth daily.         Marland Kitchen doxycycline (VIBRAMYCIN) 100 MG capsule   Oral   Take 1 capsule (100 mg total) by mouth 2 (two) times daily.   14 capsule   0   . ibuprofen (ADVIL,MOTRIN) 800 MG tablet   Oral   Take 1 tablet (800 mg total) by mouth 3 (three) times daily.   21 tablet   0   . ibuprofen (ADVIL,MOTRIN) 800 MG tablet   Oral   Take 1 tablet (800 mg total) by mouth 3 (three) times daily.   21 tablet   0   . levofloxacin (LEVAQUIN) 750 MG tablet   Oral   Take 1 tablet  (750 mg total) by mouth daily. X 7 days   7 tablet   0   . Metoclopramide HCl (REGLAN PO)   Oral   Take 1 tablet by mouth daily as needed. For nausea.         Marland Kitchen oxyCODONE-acetaminophen (PERCOCET/ROXICET) 5-325 MG per tablet   Oral   Take 1-2 tablets by mouth every 6 (six) hours as needed for pain.   15 tablet   0   . oxyCODONE-acetaminophen (PERCOCET/ROXICET) 5-325 MG per tablet   Oral   Take 1-2 tablets by mouth once.   15 tablet   0   . oxyCODONE-acetaminophen (PERCOCET/ROXICET) 5-325 MG per tablet   Oral   Take 1 tablet by mouth every 4 (four) hours as needed for pain.   15 tablet   0   . phenytoin (DILANTIN) 100 MG ER capsule   Oral   Take 2 capsules (200 mg total) by mouth 2 (two) times daily.   30 capsule   1   . predniSONE (DELTASONE) 20 MG tablet   Oral   Take 2 tablets (40 mg total) by mouth daily.   10 tablet   0   . traZODone (DESYREL) 150 MG tablet   Oral   Take 150 mg by mouth at bedtime.          BP 127/74  Pulse 106  Temp(Src) 98.2 F (36.8 C) (Oral)  Resp 20  Ht 5\' 1"  (1.549 m)  Wt 155 lb (70.308 kg)  BMI 29.3 kg/m2  SpO2 99%  LMP 07/22/2013 Physical Exam  Nursing note and vitals reviewed. Constitutional: She is oriented to person, place, and time. She appears well-developed and well-nourished. No distress.  Awake, alert, nontoxic appearance  HENT:  Head: Normocephalic and atraumatic.  Right Ear: Hearing, tympanic membrane, external ear and ear canal normal.  Left Ear: Hearing, tympanic membrane, external ear and ear canal normal.  Nose: Nose normal.  Mouth/Throat: Oropharynx is clear and moist. No oropharyngeal exudate.  Eyes: Conjunctivae are normal. No scleral icterus.  Neck: Normal range of motion. Neck supple.  Cardiovascular: Regular rhythm, S1 normal, S2 normal, normal heart sounds and intact distal pulses.  Tachycardia present.   Pulses:      Radial pulses are 2+ on the right side, and 2+ on the left side.       Dorsalis  pedis pulses are 2+ on the right side, and 2+ on the left side.       Posterior tibial pulses are 2+ on the right side, and 2+ on the left side.  tachycardia  Pulmonary/Chest: Effort normal. No accessory muscle usage. Not tachypneic. No respiratory distress. She has decreased breath sounds (throughout). She has no wheezes. She has no rhonchi. She has no rales. She exhibits  tenderness.  significantly decreased breath sounds Mild tenderness of the chest wall  Abdominal: Soft. Bowel sounds are normal. She exhibits no distension. There is no tenderness. There is no rebound.  Musculoskeletal: She exhibits no edema.       Left shoulder: She exhibits decreased range of motion, tenderness and pain. She exhibits no bony tenderness, no swelling, no effusion, no crepitus, no deformity, no laceration, no spasm, normal pulse and normal strength.  Decreased ROM of the left shoulder with abduction, adduction, internal and external rotation due to pain and poor effort Full ROM of the right shoulder  Neurological: She is alert and oriented to person, place, and time. She exhibits normal muscle tone. Coordination normal.  Speech is clear and goal oriented Moves extremities without ataxia Sensation intact to dull and sharp in the upper extremities Strength 3/5 in the LUE due to pain and poor effort.    Skin: Skin is warm and dry. No rash noted. She is not diaphoretic. No erythema.  Psychiatric: She has a normal mood and affect.    ED Course  Procedures (including critical care time) Labs Review Labs Reviewed - No data to display Imaging Review Dg Chest 2 View  07/22/2013   *RADIOLOGY REPORT*  Clinical Data: Cough and shortness of breath; chest pain  CHEST - 2 VIEW  Comparison:  September 16, 2011  Findings: There is patchy airspace consolidation in both lower lobes. Lungs elsewhere clear.  Heart size and pulmonary vascularity normal.  No adenopathy.  No bone lesions. No pneumothorax.  IMPRESSION: Lower lobe  infiltrate bilaterally.  Suspect pneumonia as most likely etiology.   Original Report Authenticated By: Bretta Bang, M.D.   Dg Shoulder Left  07/22/2013   *RADIOLOGY REPORT*  Clinical Data: Pain; recent trauma  LEFT SHOULDER - 2+ VIEW  Comparison:  January 11, 2013  Findings:  Frontal, axillary, and Y scapular images were obtained. There is no fracture or dislocation.  Joint spaces appear intact. No erosive change or intra-articular calcifications.  IMPRESSION: No abnormality noted.   Original Report Authenticated By: Bretta Bang, M.D.    MDM   1. Community acquired pneumonia   2. Shoulder injury, left, initial encounter   3. Arthralgia    Sunnie Nielsen presents with shoulder pain and increased SOB.   Patient X-Ray negative for obvious fracture or dislocation. I personally reviewed the imaging tests through PACS system.  I reviewed available ER/hospitalization records through the EMR.  Pt advised to follow up with orthopedics for further evaluation and management. Patient given sling while in ED, conservative therapy recommended and discussed.   Patient has been diagnosed with CAP via chest xray.  I personally reviewed the imaging tests through PACS system.  I reviewed available ER/hospitalization records through the EMR.  Pt is not ill appearing, immunocompromised, and does not have multiple co morbidities, therefore I feel like the they can be treated as an OP with abx therapy. Pt has been advised to return to the ED if symptoms worsen or they do not improve. Pt SOB treated with Atrovent and Xopenex with increased tidal volume and less work of breathing.  Pt ambulated and oxygen saturations remained above 90% on room air.  Pt verbalizes understanding and is agreeable with plan. I have also discussed reasons to return immediately to the ER including increased difficulty breathing, high fevers or return of chest pain.  Patient expresses understanding and agrees with  plan.       Dahlia Client Shineka Auble, PA-C 07/22/13 1738

## 2013-07-22 NOTE — ED Notes (Signed)
Unsuccessful IV attempt to right hand x1-EDPA advised solumedrol could be given IM

## 2013-07-22 NOTE — ED Notes (Addendum)
Pt refused solumedrol injection-requests prednisone po-EDPA notified

## 2013-07-22 NOTE — ED Notes (Signed)
Pt advised unable to give narcotic pain med unless she has driver present-states she has someone to drive-advised to let staff know when driver arrives

## 2013-07-22 NOTE — ED Notes (Signed)
Per RT-pt refused albuterol/atrovent neb tx-offered xopenex and EDPA approved

## 2013-07-22 NOTE — ED Notes (Signed)
At d/c, pt asked if she can have another abx due to cost of levaquin-states she can take zithromax even with erythromycin allergy-spoke with EDPA and [facility]-advised levaquin and zithromax are both $9. EDPA advised she wants pt to take levaquin-pt notified

## 2013-07-22 NOTE — ED Notes (Signed)
C/o nonprod cough, sob, chest tightness x 2 days-out of inhaler x 2 days-also wants to be seen for left shoulder injury after seizure x 3 days ago

## 2013-07-25 NOTE — ED Provider Notes (Signed)
  Medical screening examination/treatment/procedure(s) were performed by non-physician practitioner and as supervising physician I was immediately available for consultation/collaboration.    Gerhard Munch, MD 07/25/13 2306

## 2013-10-25 ENCOUNTER — Encounter (HOSPITAL_BASED_OUTPATIENT_CLINIC_OR_DEPARTMENT_OTHER): Payer: Self-pay | Admitting: Emergency Medicine

## 2013-10-25 ENCOUNTER — Emergency Department (HOSPITAL_BASED_OUTPATIENT_CLINIC_OR_DEPARTMENT_OTHER)
Admission: EM | Admit: 2013-10-25 | Discharge: 2013-10-25 | Disposition: A | Discharge status: Home / Self Care | Payer: No Typology Code available for payment source | Attending: Emergency Medicine | Admitting: Emergency Medicine

## 2013-10-25 DIAGNOSIS — Z880 Allergy status to penicillin: Secondary | ICD-10-CM | POA: Insufficient documentation

## 2013-10-25 DIAGNOSIS — Z792 Long term (current) use of antibiotics: Secondary | ICD-10-CM | POA: Insufficient documentation

## 2013-10-25 DIAGNOSIS — G40909 Epilepsy, unspecified, not intractable, without status epilepticus: Secondary | ICD-10-CM | POA: Insufficient documentation

## 2013-10-25 DIAGNOSIS — L0291 Cutaneous abscess, unspecified: Secondary | ICD-10-CM

## 2013-10-25 DIAGNOSIS — IMO0002 Reserved for concepts with insufficient information to code with codable children: Secondary | ICD-10-CM | POA: Insufficient documentation

## 2013-10-25 DIAGNOSIS — Z79899 Other long term (current) drug therapy: Secondary | ICD-10-CM | POA: Insufficient documentation

## 2013-10-25 DIAGNOSIS — E119 Type 2 diabetes mellitus without complications: Secondary | ICD-10-CM | POA: Insufficient documentation

## 2013-10-25 DIAGNOSIS — Z87820 Personal history of traumatic brain injury: Secondary | ICD-10-CM | POA: Insufficient documentation

## 2013-10-25 DIAGNOSIS — F17200 Nicotine dependence, unspecified, uncomplicated: Secondary | ICD-10-CM | POA: Insufficient documentation

## 2013-10-25 DIAGNOSIS — Z791 Long term (current) use of non-steroidal anti-inflammatories (NSAID): Secondary | ICD-10-CM | POA: Insufficient documentation

## 2013-10-25 DIAGNOSIS — G43909 Migraine, unspecified, not intractable, without status migrainosus: Secondary | ICD-10-CM | POA: Insufficient documentation

## 2013-10-25 DIAGNOSIS — K047 Periapical abscess without sinus: Secondary | ICD-10-CM | POA: Insufficient documentation

## 2013-10-25 DIAGNOSIS — K0889 Other specified disorders of teeth and supporting structures: Secondary | ICD-10-CM

## 2013-10-25 DIAGNOSIS — J45909 Unspecified asthma, uncomplicated: Secondary | ICD-10-CM | POA: Insufficient documentation

## 2013-10-25 MED ORDER — CLINDAMYCIN HCL 150 MG PO CAPS: 150.0000 mg | ORAL_CAPSULE | Freq: Four times a day (QID) | ORAL | Status: DC

## 2013-10-25 MED ORDER — OXYCODONE-ACETAMINOPHEN 5-325 MG PO TABS: 1.0000 | ORAL_TABLET | ORAL | Status: DC | PRN

## 2013-10-25 NOTE — ED Provider Notes (Signed)
Medical screening examination/treatment/procedure(s) were performed by non-physician practitioner and as supervising physician I was immediately available for consultation/collaboration.  EKG Interpretation   None         William Yuridia Couts, MD 10/25/13 1324 

## 2013-10-25 NOTE — ED Provider Notes (Signed)
CSN: [id number]     Arrival date & time 10/25/13  1114 History   First MD Initiated Contact with Patient 10/25/13 1156     Chief Complaint  Patient presents with  . Dental Pain   (Consider location/radiation/quality/duration/timing/severity/associated sxs/prior Treatment) HPI Comments: Pt states that in the last couple of days she had left upper tooth crack and now she is having pain to the area:no fever or swelling:pt states that also has a abscess to the right axilla:pt refusing to have it drained  The history is provided by the patient. No language interpreter was used.    Past Medical History  Diagnosis Date  . Migraine   . Seizures   . Brain injury   . Diabetes mellitus without complication   . Asthma   . Bronchitis, chronic    Past Surgical History  Procedure Laterality Date  . Tubal ligation    . Traumatic brain injury     No family history on file. History  Substance Use Topics  . Smoking status: Current Every Day Smoker -- 0.50 packs/day    Types: Cigarettes  . Smokeless tobacco: Not on file  . Alcohol Use: No   OB History   Grav Para Term Preterm Abortions TAB SAB Ect Mult Living                 Review of Systems  Constitutional: Negative.   Respiratory: Negative.   Cardiovascular: Negative.     Allergies  Cephalosporins; Penicillins; Erythromycin; Tramadol; and Vicodin  Home Medications   Current Outpatient Rx  Name  Route  Sig  Dispense  Refill  . amitriptyline (ELAVIL) 75 MG tablet   Oral   Take 75 mg by mouth at bedtime.         Marland Kitchen azithromycin (ZITHROMAX) 250 MG tablet   Oral   Take 1 tablet (250 mg total) by mouth daily. Take first 2 tablets together, then 1 every day until finished.   6 tablet   0   . citalopram (CELEXA) 20 MG tablet   Oral   Take 20 mg by mouth daily.         . clindamycin (CLEOCIN) 150 MG capsule   Oral   Take 1 capsule (150 mg total) by mouth 4 (four) times daily.   28 capsule   0   . doxycycline  (VIBRAMYCIN) 100 MG capsule   Oral   Take 1 capsule (100 mg total) by mouth 2 (two) times daily.   14 capsule   0   . ibuprofen (ADVIL,MOTRIN) 800 MG tablet   Oral   Take 1 tablet (800 mg total) by mouth 3 (three) times daily.   21 tablet   0   . ibuprofen (ADVIL,MOTRIN) 800 MG tablet   Oral   Take 1 tablet (800 mg total) by mouth 3 (three) times daily.   21 tablet   0   . levofloxacin (LEVAQUIN) 750 MG tablet   Oral   Take 1 tablet (750 mg total) by mouth daily. X 7 days   7 tablet   0   . Metoclopramide HCl (REGLAN PO)   Oral   Take 1 tablet by mouth daily as needed. For nausea.         Marland Kitchen oxyCODONE-acetaminophen (PERCOCET/ROXICET) 5-325 MG per tablet   Oral   Take 1-2 tablets by mouth every 6 (six) hours as needed for pain.   15 tablet   0   . oxyCODONE-acetaminophen (PERCOCET/ROXICET) 5-325 MG per tablet  Oral   Take 1 tablet by mouth every 4 (four) hours as needed for pain.   15 tablet   0   . oxyCODONE-acetaminophen (PERCOCET/ROXICET) 5-325 MG per tablet   Oral   Take 1-2 tablets by mouth every 4 (four) hours as needed for severe pain.   10 tablet   0   . phenytoin (DILANTIN) 100 MG ER capsule   Oral   Take 2 capsules (200 mg total) by mouth 2 (two) times daily.   30 capsule   1   . predniSONE (DELTASONE) 20 MG tablet   Oral   Take 2 tablets (40 mg total) by mouth daily.   10 tablet   0   . traZODone (DESYREL) 150 MG tablet   Oral   Take 150 mg by mouth at bedtime.          BP 134/83  Pulse 78  Temp(Src) 98.1 F (36.7 C) (Oral)  Resp 16  Ht 5\' 1"  (1.549 m)  Wt 145 lb (65.772 kg)  BMI 27.41 kg/m2  SpO2 98%  LMP 10/11/2013 Physical Exam  Nursing note and vitals reviewed. Constitutional: She is oriented to person, place, and time. She appears well-developed.  HENT:  Right Ear: External ear normal.  Left Ear: External ear normal.  Mouth/Throat:    Eyes: Conjunctivae and EOM are normal.  Cardiovascular: Normal rate and regular  rhythm.   Pulmonary/Chest: Effort normal and breath sounds normal.  Musculoskeletal: Normal range of motion.  Neurological: She is alert and oriented to person, place, and time.  Skin:  Red fluctuant tender area to the right axilla  Psychiatric: She has a normal mood and affect.    ED Course  Procedures (including critical care time) Labs Review Labs Reviewed - No data to display Imaging Review No results found.  EKG Interpretation   None       MDM   1. Pain, dental   2. Abscess    Pt refusing drainage of abscess:pt treated with clindamycin and hydrocodone for dental pain and abscess due to pcn allergy  Teressa Lower, NP 10/25/13 1258

## 2013-10-25 NOTE — ED Notes (Signed)
Dental pain x 3 days.  

## 2013-11-20 ENCOUNTER — Emergency Department (HOSPITAL_BASED_OUTPATIENT_CLINIC_OR_DEPARTMENT_OTHER): Payer: Self-pay

## 2013-11-20 ENCOUNTER — Encounter (HOSPITAL_BASED_OUTPATIENT_CLINIC_OR_DEPARTMENT_OTHER): Payer: Self-pay | Admitting: Emergency Medicine

## 2013-11-20 ENCOUNTER — Emergency Department (HOSPITAL_BASED_OUTPATIENT_CLINIC_OR_DEPARTMENT_OTHER)
Admission: EM | Admit: 2013-11-20 | Discharge: 2013-11-20 | Disposition: A | Discharge status: Home / Self Care | Payer: Self-pay | Attending: Emergency Medicine | Admitting: Emergency Medicine

## 2013-11-20 DIAGNOSIS — Z880 Allergy status to penicillin: Secondary | ICD-10-CM | POA: Insufficient documentation

## 2013-11-20 DIAGNOSIS — Z79899 Other long term (current) drug therapy: Secondary | ICD-10-CM | POA: Insufficient documentation

## 2013-11-20 DIAGNOSIS — Z791 Long term (current) use of non-steroidal anti-inflammatories (NSAID): Secondary | ICD-10-CM | POA: Insufficient documentation

## 2013-11-20 DIAGNOSIS — G43909 Migraine, unspecified, not intractable, without status migrainosus: Secondary | ICD-10-CM | POA: Insufficient documentation

## 2013-11-20 DIAGNOSIS — G40909 Epilepsy, unspecified, not intractable, without status epilepticus: Secondary | ICD-10-CM | POA: Insufficient documentation

## 2013-11-20 DIAGNOSIS — F411 Generalized anxiety disorder: Secondary | ICD-10-CM | POA: Insufficient documentation

## 2013-11-20 DIAGNOSIS — J189 Pneumonia, unspecified organism: Secondary | ICD-10-CM

## 2013-11-20 DIAGNOSIS — Z3202 Encounter for pregnancy test, result negative: Secondary | ICD-10-CM | POA: Insufficient documentation

## 2013-11-20 DIAGNOSIS — Z87820 Personal history of traumatic brain injury: Secondary | ICD-10-CM | POA: Insufficient documentation

## 2013-11-20 DIAGNOSIS — E119 Type 2 diabetes mellitus without complications: Secondary | ICD-10-CM | POA: Insufficient documentation

## 2013-11-20 DIAGNOSIS — F17200 Nicotine dependence, unspecified, uncomplicated: Secondary | ICD-10-CM | POA: Insufficient documentation

## 2013-11-20 DIAGNOSIS — J45901 Unspecified asthma with (acute) exacerbation: Secondary | ICD-10-CM | POA: Insufficient documentation

## 2013-11-20 DIAGNOSIS — IMO0002 Reserved for concepts with insufficient information to code with codable children: Secondary | ICD-10-CM | POA: Insufficient documentation

## 2013-11-20 DIAGNOSIS — J159 Unspecified bacterial pneumonia: Secondary | ICD-10-CM | POA: Insufficient documentation

## 2013-11-20 DIAGNOSIS — Z792 Long term (current) use of antibiotics: Secondary | ICD-10-CM | POA: Insufficient documentation

## 2013-11-20 LAB — CBC
HCT: 37.4 % (ref 36.0–46.0)
HEMOGLOBIN: 13 g/dL (ref 12.0–15.0)
MCH: 32.7 pg (ref 26.0–34.0)
MCHC: 34.8 g/dL (ref 30.0–36.0)
MCV: 94 fL (ref 78.0–100.0)
Platelets: 362 10*3/uL (ref 150–400)
RBC: 3.98 MIL/uL (ref 3.87–5.11)
RDW: 13.7 % (ref 11.5–15.5)
WBC: 20.1 10*3/uL — ABNORMAL HIGH (ref 4.0–10.5)

## 2013-11-20 LAB — PREGNANCY, URINE: PREG TEST UR: NEGATIVE

## 2013-11-20 LAB — GLUCOSE, CAPILLARY: Glucose-Capillary: 98 mg/dL (ref 70–99)

## 2013-11-20 LAB — BASIC METABOLIC PANEL
BUN: 5 mg/dL — ABNORMAL LOW (ref 6–23)
CALCIUM: 9.5 mg/dL (ref 8.4–10.5)
CO2: 20 mEq/L (ref 19–32)
Chloride: 107 mEq/L (ref 96–112)
Creatinine, Ser: 0.6 mg/dL (ref 0.50–1.10)
GLUCOSE: 124 mg/dL — AB (ref 70–99)
Potassium: 4.1 mEq/L (ref 3.7–5.3)
SODIUM: 142 meq/L (ref 137–147)

## 2013-11-20 LAB — D-DIMER, QUANTITATIVE: D-Dimer, Quant: 0.5 ug/mL-FEU — ABNORMAL HIGH (ref 0.00–0.48)

## 2013-11-20 LAB — TROPONIN I: Troponin I: <0.3 ng/mL (ref <0.30)

## 2013-11-20 MED ORDER — LORAZEPAM 1 MG PO TABS
1.0000 mg | ORAL_TABLET | Freq: Once | ORAL | Status: AC
  Administered 2013-11-20: 1 mg via ORAL
  Filled 2013-11-20: qty 1

## 2013-11-20 MED ORDER — IOHEXOL 350 MG/ML SOLN
80.0000 mL | Freq: Once | INTRAVENOUS | Status: AC | PRN
  Administered 2013-11-20: 80 mL via INTRAVENOUS

## 2013-11-20 MED ORDER — OXYCODONE-ACETAMINOPHEN 5-325 MG PO TABS: 1.0000 | ORAL_TABLET | Freq: Four times a day (QID) | ORAL | Status: DC | PRN

## 2013-11-20 MED ORDER — LEVOFLOXACIN 750 MG PO TABS: 750.0000 mg | ORAL_TABLET | Freq: Every day | ORAL | Status: DC

## 2013-11-20 MED ORDER — LEVOFLOXACIN IN D5W 750 MG/150ML IV SOLN
750.0000 mg | INTRAVENOUS | Status: DC
  Administered 2013-11-20: 750 mg via INTRAVENOUS
  Filled 2013-11-20: qty 150

## 2013-11-20 MED ORDER — OXYCODONE-ACETAMINOPHEN 5-325 MG PO TABS
2.0000 | ORAL_TABLET | Freq: Once | ORAL | Status: AC
  Administered 2013-11-20: 2 via ORAL
  Filled 2013-11-20: qty 2

## 2013-11-20 MED ORDER — HYDROMORPHONE HCL PF 1 MG/ML IJ SOLN
1.0000 mg | Freq: Once | INTRAMUSCULAR | Status: AC
  Administered 2013-11-20: 1 mg via INTRAVENOUS
  Filled 2013-11-20: qty 1

## 2013-11-20 NOTE — Discharge Instructions (Signed)
Pneumonia, Adult °Pneumonia is an infection of the lungs.  °CAUSES °Pneumonia may be caused by bacteria or a virus. Usually, these infections are caused by breathing infectious particles into the lungs (respiratory tract). °SYMPTOMS  °· Cough. °· Fever. °· Chest pain. °· Increased rate of breathing. °· Wheezing. °· Mucus production. °DIAGNOSIS  °If you have the common symptoms of pneumonia, your caregiver will typically confirm the diagnosis with a chest X-ray. The X-ray will show an abnormality in the lung (pulmonary infiltrate) if you have pneumonia. Other tests of your blood, urine, or sputum may be done to find the specific cause of your pneumonia. Your caregiver may also do tests (blood gases or pulse oximetry) to see how well your lungs are working. °TREATMENT  °Some forms of pneumonia may be spread to other people when you cough or sneeze. You may be asked to wear a mask before and during your exam. Pneumonia that is caused by bacteria is treated with antibiotic medicine. Pneumonia that is caused by the influenza virus may be treated with an antiviral medicine. Most other viral infections must run their course. These infections will not respond to antibiotics.  °PREVENTION °A pneumococcal shot (vaccine) is available to prevent a common bacterial cause of pneumonia. This is usually suggested for: °· People over 65 years old. °· Patients on chemotherapy. °· People with chronic lung problems, such as bronchitis or emphysema. °· People with immune system problems. °If you are over 65 or have a high risk condition, you may receive the pneumococcal vaccine if you have not received it before. In some countries, a routine influenza vaccine is also recommended. This vaccine can help prevent some cases of pneumonia. You may be offered the influenza vaccine as part of your care. °If you smoke, it is time to quit. You may receive instructions on how to stop smoking. Your caregiver can provide medicines and counseling to  help you quit. °HOME CARE INSTRUCTIONS  °· Cough suppressants may be used if you are losing too much rest. However, coughing protects you by clearing your lungs. You should avoid using cough suppressants if you can. °· Your caregiver may have prescribed medicine if he or she thinks your pneumonia is caused by a bacteria or influenza. Finish your medicine even if you start to feel better. °· Your caregiver may also prescribe an expectorant. This loosens the mucus to be coughed up. °· Only take over-the-counter or prescription medicines for pain, discomfort, or fever as directed by your caregiver. °· Do not smoke. Smoking is a common cause of bronchitis and can contribute to pneumonia. If you are a smoker and continue to smoke, your cough may last several weeks after your pneumonia has cleared. °· A cold steam vaporizer or humidifier in your room or home may help loosen mucus. °· Coughing is often worse at night. Sleeping in a semi-upright position in a recliner or using a couple pillows under your head will help with this. °· Get rest as you feel it is needed. Your body will usually let you know when you need to rest. °SEEK IMMEDIATE MEDICAL CARE IF:  °· Your illness becomes worse. This is especially true if you are elderly or weakened from any other disease. °· You cannot control your cough with suppressants and are losing sleep. °· You begin coughing up blood. °· You develop pain which is getting worse or is uncontrolled with medicines. °· You have a fever. °· Any of the symptoms which initially brought you in for treatment   are getting worse rather than better.  You develop shortness of breath or chest pain. MAKE SURE YOU:   Understand these instructions.  Will watch your condition.  Will get help right away if you are not doing well or get worse. Document Released: 11/03/2005 Document Revised: 01/26/2012 Document Reviewed: 01/23/2011 [facility] Patient Information 2014 [place], [place].   Emergency  Department Resource Guide 1) Find a Doctor and Pay Out of Pocket Although you won't have to find out who is covered by your insurance plan, it is a good idea to ask around and get recommendations. You will then need to call the office and see if the doctor you have chosen will accept you as a new patient and what types of options they offer for patients who are self-pay. Some doctors offer discounts or will set up payment plans for their patients who do not have insurance, but you will need to ask so you aren't surprised when you get to your appointment.  2) Contact Your Local Health Department Not all health departments have doctors that can see patients for sick visits, but many do, so it is worth a call to see if yours does. If you don't know where your local health department is, you can check in your phone book. The CDC also has a tool to help you locate your state's health department, and many state websites also have listings of all of their local health departments.  3) Find a [facility] If your illness is not likely to be very severe or complicated, you may want to try a walk in clinic. These are popping up all over the country in pharmacies, drugstores, and shopping centers. They're usually staffed by nurse practitioners or physician assistants that have been trained to treat common illnesses and complaints. They're usually fairly quick and inexpensive. However, if you have serious medical issues or chronic medical problems, these are probably not your best option.  No Primary Care Doctor: - Call Health Connect at  [phone] - they can help you locate a primary care doctor that  accepts your insurance, provides certain services, etc. - Physician Referral Service- [phone]  Chronic Pain Problems: Organization         Address  Phone   Notes  [facility]  [phone] Patients need to be referred by their primary care doctor.   Medication  Assistance: Organization         Address  Phone   Notes  [facility] Medication [facility] [address] [place], [place] [address] [phone] --Must be a resident of [place] -- Must have NO insurance coverage whatsoever (no Medicaid/ Medicare, etc.) -- The pt. MUST have a primary care doctor that directs their care regularly and follows them in the community   [facility]ist  [phone]   [employer]  [phone]    Agencies that provide inexpensive medical care: Organization         Address  Phone   Notes  [place]  [phone]   [facility]    [phone]   [facility] [place], [place] [address] [phone]   [place] [address], [place] [phone]   Planned Parenthood    [phone]   [facility]    [phone]   [place] and [facility] [place], [place] Phone:  [phone],  Fax:  [phone] Hours of Operation:  9 am - 6 pm, M-F.  Also accepts Medicaid/Medicare and self-pay.  [facility] for [place], [address], [place] Phone: [phone], Fax: [phone]. Hours of Operation:  8:30 am - 5:30 pm, M-F.  Also accepts Medicaid and self-pay.  [facility] [place] [address] [place] Phone: [phone]   [place], [place], [place] [phone], Ext. [phone] Mondays & Thursdays: 7-9 AM.  First 15 patients are seen on a first come, first serve basis.    [place] Providers:  Organization         Address  Phone   Notes  [facility] [address], [place] [phone] Also accepts self-pay patients.  [facility] [address], [place]  [phone]   [place], [address], [place]  [phone]   [facility] [address], [place] [phone]   Lucianne Lei [address], [place]   [phone] Only accepts [place] Access [place] patients after they have their name applied to their card.   Self-Pay (no insurance) in [facility]:  Organization         Address  Phone   Notes  Sickle Cell Patients, [facility] [place] [phone]   [facility] [place] [phone]   [facility] [place]  [place], [place], [place] [phone]   [facility]/Dr. Osei-Bonsu  [address] [place] or [address], [place] [phone] Phone number for both [place] and [place] locations is the same.  Urgent Medical and [facility] [address], [place] [phone]   [facility] [address], [place] or [address] [phone]   [facility] [address], [place] [phone], phone; [phone], fax Sees patients 1st and 3rd Saturday of every month.  Must not qualify for public or private insurance (i.e. Medicaid, Medicare, [place] Health Choice, Veterans' Benefits)  Household income should be no more than 200% of the poverty level The clinic cannot treat you if you are pregnant or think you are pregnant  Sexually transmitted diseases are not treated at the clinic.    Dental Care: Organization         Address  Phone  Notes  [facility] Department of [facility] [place] [phone] Accepts children up to age 39 who are enrolled in [place] or [place]; pregnant women with a Medicaid card; and children who have applied for Medicaid or [place] Health Choice, but were declined, whose parents can pay a reduced fee at time of service.  [facility]  Department of [facility]  [address], [place] [phone] Accepts children up to age 11 who are enrolled in [place] or [place]; pregnant women with a Medicaid card; and children who have applied for Medicaid or [place] Health Choice, but were declined, whose parents can pay a reduced fee at time of service.  [place] Adult Dental Access PROGRAM  [place] [phone] Patients are seen by appointment only. Walk-ins are not  accepted. [place] will see patients 55 years of age and older. Monday - Tuesday (8am-5pm) Most Wednesdays (8:30-5pm) $30 per visit, cash only  [place] [facility] Access [facility]  [address], [facility] [phone] Patients are seen by appointment only. Walk-ins are not accepted. [place] will see patients 43 years of age and older. One Wednesday Evening (Monthly: Volunteer Based).  $30 per visit, cash only  [place]  [phone] for adults; Children under age 13, call Graduate Pediatric Dentistry at [phone]. Children aged 7-14, please call [phone] to request a pediatric application.  Dental services are provided in all areas of dental care including fillings, crowns and bridges, complete and partial dentures, implants, gum treatment, root canals, and extractions. Preventive care is also provided. Treatment is provided to both adults and children. Patients are selected via a lottery and there is often a waiting list.   [facility] [address], [place]  [phone] [email or web address]   [facility] [address] [place], [place] [phone], Ext. [phone] Second and Fourth Thursday of each month, opens at 6:30 AM; Clinic ends at 9 AM.  Patients are seen on a first-come first-served basis, and a limited number are seen during each clinic.   [facility]  [address] [place]ard Danker [place], [place] [phone]    Eligibility Requirements You must have lived in [place], [place], or [place] counties for at least the last three months.   You cannot be eligible for state or federal sponsored [employer], including [employer], [place], or [employer].   You generally cannot be eligible for healthcare insurance through your employer.    How to apply: Eligibility screenings are held every Tuesday and Wednesday afternoon from 1:00 pm until 4:00 pm. You do not need an appointment for the interview!  [facility] [address] [place], [place]   [place]  [place] Department  [place]  [phone]    Behavioral Health Resources in the Community: Intensive Outpatient Programs Organization         Address  Phone  Notes  [place]. [address] [place], [place] [phone]   [facility] Outpatient [address] [place], [place]   ADS: Alcohol & Drug Svcs [address], [place], [place]   [place] [address]  [place], [place] or [phone]   Substance Abuse Resources Organization         Address  Phone  Notes  Alcohol and Drug Services  [phone]   [place]  [phone]   [facility] [place]   Chinita Pester  [phone]   Residential & Outpatient Substance Abuse Program  [phone]   Psychological Services Organization         Address  Phone  Notes  [facility] [place]  [place]  [phone]   [place] [address], [place] or [phone]    Mobile Crisis Teams Organization         Address  Phone  Notes  [facility], Mobile Crisis Care Unit  [phone]   [facility]  [address]  [place], [place]   [facility] [address], [place] [phone]    Self-Help/Support Groups  Organization         Address  Phone             Notes  Mental Health Assoc. of [place] - variety of support groups  [place] Call for more information  Narcotics Anonymous (NA), [facility] [address], [employer] [place]  2 meetings at this location   Special educational needs teacher         Address  Phone  Notes  [facility] Residential Treatment [place],    [place]  1-(843)[phone]   [facility]  [address] [place] [phone], [place], [place]   [place], [place] [phone] Admissions: 8am-3pm M-F  Incentives Substance [place] [address]    [place], [place] [phone]   [facility] [address] [place] [address] [place], [place], [place]   [facility] [address],  [place], [place]   [facility] - Intensive Outpatient [address] Kristeen Mans [address], [place], [place]   [facility] ([place].,  [place], [place] [phone] or [phone]   [facility] Services (RTS) [address], [place], [place] Accepts Medicaid  [facility] [place] [address],  [place] [phone] Substance Abuse/Addiction Treatment   [facility] Organization         Address  Phone  Notes  [facility]  [phone]   Domenic Schwab, PhD [address] Arlis [place], [place]   [phone] or [phone]   [place], [place] [phone]   [facility] [address] [place], [place] [phone] Insurance/Medicaid/sponsorship through [facility] and Families [address] [place]                                    [place], [place] [phone] [place] [address], [place] [phone]    Dr. Adele Schilder  [phone]   [facility] [place] Dept. 1) [address], [place] 2) [place] 3)  [address], [place] [phone]  [phone]   [place] [phone] or [phone] (After Hours)

## 2013-11-20 NOTE — ED Notes (Signed)
Patient c/o sob & chest pain when she takes a breath since yesterday, used inhaler but no relief

## 2013-11-20 NOTE — ED Notes (Signed)
Patient instructed to return to ED if condition worse in the next two days, instructions for antibiotics given, patient verbalized understanding, Patient refused to be admitted to Hospital for treatment

## 2013-11-20 NOTE — ED Provider Notes (Signed)
CSN: [id number]     Arrival date & time 11/20/13  1017 History   First MD Initiated Contact with Patient 11/20/13 [phone]     Chief Complaint  Patient presents with  . Shortness of Breath   (Consider location/radiation/quality/duration/timing/severity/associated sxs/prior Treatment) HPI Comments: Cough and CP began last night. CP worse with deep breathing.  Patient is a 38 y.o. female presenting with shortness of breath. The history is provided by the patient.  Shortness of Breath Severity:  Moderate Onset quality:  Sudden Timing:  Constant Progression:  Unchanged Chronicity:  New Context: URI (1 week ago)   Context: not fumes   Relieved by:  Nothing Worsened by:  Nothing tried Ineffective treatments:  Inhaler Associated symptoms: chest pain and cough   Associated symptoms: no abdominal pain, no fever, no neck pain and no vomiting   Chest pain:    Quality:  Sharp   Severity:  Moderate   Onset quality:  Sudden   Timing:  Constant   Progression:  Unchanged   Chronicity:  New   Past Medical History  Diagnosis Date  . Migraine   . Seizures   . Brain injury   . Diabetes mellitus without complication   . Asthma   . Bronchitis, chronic    Past Surgical History  Procedure Laterality Date  . Tubal ligation    . Traumatic brain injury     No family history on file. History  Substance Use Topics  . Smoking status: Current Every Day Smoker -- 0.50 packs/day    Types: Cigarettes  . Smokeless tobacco: Not on file  . Alcohol Use: No   OB History   Grav Para Term Preterm Abortions TAB SAB Ect Mult Living                 Review of Systems  Constitutional: Negative for fever.  Respiratory: Positive for cough and shortness of breath.   Cardiovascular: Positive for chest pain.  Gastrointestinal: Negative for vomiting and abdominal pain.  Musculoskeletal: Negative for neck pain.  All other systems reviewed and are negative.    Allergies  Cephalosporins; Penicillins;  Erythromycin; Tramadol; and Vicodin  Home Medications   Current Outpatient Rx  Name  Route  Sig  Dispense  Refill  . amitriptyline (ELAVIL) 75 MG tablet   Oral   Take 75 mg by mouth at bedtime.         Marland Kitchen azithromycin (ZITHROMAX) 250 MG tablet   Oral   Take 1 tablet (250 mg total) by mouth daily. Take first 2 tablets together, then 1 every day until finished.   6 tablet   0   . citalopram (CELEXA) 20 MG tablet   Oral   Take 20 mg by mouth daily.         . clindamycin (CLEOCIN) 150 MG capsule   Oral   Take 1 capsule (150 mg total) by mouth 4 (four) times daily.   28 capsule   0   . doxycycline (VIBRAMYCIN) 100 MG capsule   Oral   Take 1 capsule (100 mg total) by mouth 2 (two) times daily.   14 capsule   0   . ibuprofen (ADVIL,MOTRIN) 800 MG tablet   Oral   Take 1 tablet (800 mg total) by mouth 3 (three) times daily.   21 tablet   0   . ibuprofen (ADVIL,MOTRIN) 800 MG tablet   Oral   Take 1 tablet (800 mg total) by mouth 3 (three) times daily.   21  tablet   0   . levofloxacin (LEVAQUIN) 750 MG tablet   Oral   Take 1 tablet (750 mg total) by mouth daily. X 7 days   7 tablet   0   . Metoclopramide HCl (REGLAN PO)   Oral   Take 1 tablet by mouth daily as needed. For nausea.         Marland Kitchen oxyCODONE-acetaminophen (PERCOCET/ROXICET) 5-325 MG per tablet   Oral   Take 1-2 tablets by mouth every 6 (six) hours as needed for pain.   15 tablet   0   . oxyCODONE-acetaminophen (PERCOCET/ROXICET) 5-325 MG per tablet   Oral   Take 1 tablet by mouth every 4 (four) hours as needed for pain.   15 tablet   0   . oxyCODONE-acetaminophen (PERCOCET/ROXICET) 5-325 MG per tablet   Oral   Take 1-2 tablets by mouth every 4 (four) hours as needed for severe pain.   10 tablet   0   . phenytoin (DILANTIN) 100 MG ER capsule   Oral   Take 2 capsules (200 mg total) by mouth 2 (two) times daily.   30 capsule   1   . predniSONE (DELTASONE) 20 MG tablet   Oral   Take 2  tablets (40 mg total) by mouth daily.   10 tablet   0   . traZODone (DESYREL) 150 MG tablet   Oral   Take 150 mg by mouth at bedtime.          BP 130/88  Pulse 109  Temp(Src) 98.3 F (36.8 C) (Oral)  Resp 26  Ht 5\' 1"  (1.549 m)  Wt 145 lb (65.772 kg)  BMI 27.41 kg/m2  SpO2 95%  LMP 10/11/2013 Physical Exam  Nursing note and vitals reviewed. Constitutional: She is oriented to person, place, and time. She appears well-developed and well-nourished. No distress.  Anxious, pressured speech  HENT:  Head: Normocephalic and atraumatic.  Eyes: EOM are normal. Pupils are equal, round, and reactive to light.  Neck: Normal range of motion. Neck supple.  Cardiovascular: Normal rate and regular rhythm.  Exam reveals no friction rub.   No murmur heard. Pulmonary/Chest: Effort normal and breath sounds normal. No respiratory distress. She has no wheezes. She has no rales.  Abdominal: Soft. She exhibits no distension. There is no tenderness. There is no rebound.  Musculoskeletal: Normal range of motion. She exhibits no edema.  Neurological: She is alert and oriented to person, place, and time.  Skin: She is not diaphoretic.    ED Course  Procedures (including critical care time) Labs Review Labs Reviewed  CBC - Abnormal; Notable for the following:    WBC 20.1 (*)    All other components within normal limits  BASIC METABOLIC PANEL - Abnormal; Notable for the following:    Glucose, Bld 124 (*)    BUN 5 (*)    All other components within normal limits  D-DIMER, QUANTITATIVE - Abnormal; Notable for the following:    D-Dimer, Quant 0.50 (*)    All other components within normal limits  CULTURE, BLOOD (ROUTINE X 2)  CULTURE, BLOOD (ROUTINE X 2)  PREGNANCY, URINE  TROPONIN I  GLUCOSE, CAPILLARY   Imaging Review Dg Chest 2 View  11/20/2013   CLINICAL DATA:  Severe mid chest pain with cough, shortness of breath. History of asthma.  EXAM: CHEST  2 VIEW  COMPARISON:  Back 04/05/2013 and  09/16/2011.  FINDINGS: The heart size and mediastinal contours are stable. There are  persistent perihilar and basilar pulmonary opacities bilaterally with an asymmetric component on the right. Compared with the study from 4 months ago, there has been partial clearing on the left. There is no pleural effusion. The osseous structures appear unchanged.  IMPRESSION: Persistent or recurrent perihilar and lower lobe airspace opacities bilaterally. Similar findings or demonstrated 4 months ago. Findings could reflect recurrent pneumonia, atypical edema or a more indolent process such as pulmonary hemorrhage. If the etiology for the findings has not been determined, CT may be helpful for further evaluation.   Electronically Signed   By: Camie Patience M.D.   On: 11/20/2013 08:37   Ct Angio Chest Pe W/cm &/or Wo Cm  11/20/2013   CLINICAL DATA:  Chest pain. Shortness of breath.  Dyspnea.  EXAM: CT ANGIOGRAPHY CHEST WITH CONTRAST  TECHNIQUE: Multidetector CT imaging of the chest was performed using the standard protocol during bolus administration of intravenous contrast. Multiplanar CT image reconstructions including MIPs were obtained to evaluate the vascular anatomy.  CONTRAST:  41mL OMNIPAQUE IOHEXOL 350 MG/ML SOLN  COMPARISON:  11/20/2013; 09/14/2011  FINDINGS: No filling defect is identified in the pulmonary arterial tree to suggest pulmonary embolus. No acute aortic dissection. No pathologic thoracic adenopathy.  Bilateral scattered ground-glass opacities are observed, affecting groups secondary pulmonary lobules in a confluent appearance, but with various on affected regions of lung interspersed. This appearance was not present on the prior CT scan from 2012, but similar findings, especially in the lower lobes, or present on 07/22/2013.  Review of the MIP images confirms the above findings.  IMPRESSION: 1. Confluent ground-glass densities in both lungs, potentially progressing over the last 3-4 months based on the  imaging record. 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 Entities such as hypersensitivity pneumonitis, chronic eosinophilic pneumonia, and alveolar proteinosis are less likely given the appearance. 2. No filling defect is identified in the pulmonary arterial tree to suggest pulmonary embolus.   Electronically Signed   By: Sherryl Barters M.D.   On: 11/20/2013 11:08    EKG Interpretation    Date/Time:  Sunday November 20 2013 07:12:53 EST Ventricular Rate:  105 PR Interval:  144 QRS Duration: 70 QT Interval:  314 QTC Calculation: 415 R Axis:   55 Text Interpretation:  Sinus tachycardia Otherwise normal ECG No prior EKG Confirmed by Mingo Amber  MD, Lennon ([id number]) on 11/20/2013 7:40:17 AM            MDM   1. Community acquired pneumonia    38 year old female presents with chest pain and shortness of breath. Symptoms began suddenly yesterday. She stating she's having a lot of pain with taking deep breaths and so she is talking in pressured sentences. She has no history of asthma, but stated inhaler did not help her symptoms. She has no other medical history. She had a cold a week ago, stated she was over that. Here she is afebrile, tachycardic. Her lungs are clear. She is in no acute respiratory distress. She is not hypoxic with sats in the upper 90s, 95-97%on room air. EKG is normal. Will check d-dimer, troponin. Single troponin will suffice since pain has been constant. Also check chest x-ray. CXR shows persistent/recurrent opacities. Will CT to evaluate further. CT shows cryptogenic organizing pneumonia. Levaquin given. Refuse to be admitted because she is oriented she school tomorrow. I pleaded with patient and states this is very serious and she is aware of risks, but she wants to go home. She signed AMA form. Given Levaquin prescription and Vicodin  for cough and pain  Osvaldo Shipper, MD 11/20/13 [phone]

## 2013-11-20 NOTE — ED Notes (Signed)
Patient changed mind on going to the hospital--per Dr. Mingo Amber

## 2013-11-21 ENCOUNTER — Encounter (HOSPITAL_BASED_OUTPATIENT_CLINIC_OR_DEPARTMENT_OTHER): Payer: Self-pay | Admitting: Emergency Medicine

## 2013-11-21 ENCOUNTER — Emergency Department (HOSPITAL_BASED_OUTPATIENT_CLINIC_OR_DEPARTMENT_OTHER): Payer: Self-pay

## 2013-11-21 ENCOUNTER — Inpatient Hospital Stay (HOSPITAL_BASED_OUTPATIENT_CLINIC_OR_DEPARTMENT_OTHER)
Admission: EM | Admit: 2013-11-21 | Discharge: 2013-11-24 | DRG: 555 | Disposition: A | Discharge status: Home / Self Care | Payer: Self-pay | Attending: Internal Medicine | Admitting: Internal Medicine

## 2013-11-21 DIAGNOSIS — R569 Unspecified convulsions: Secondary | ICD-10-CM

## 2013-11-21 DIAGNOSIS — Z9119 Patient's noncompliance with other medical treatment and regimen: Secondary | ICD-10-CM

## 2013-11-21 DIAGNOSIS — Z5987 Material hardship due to limited financial resources, not elsewhere classified: Secondary | ICD-10-CM

## 2013-11-21 DIAGNOSIS — Z598 Other problems related to housing and economic circumstances: Secondary | ICD-10-CM

## 2013-11-21 DIAGNOSIS — F17200 Nicotine dependence, unspecified, uncomplicated: Secondary | ICD-10-CM | POA: Diagnosis present

## 2013-11-21 DIAGNOSIS — E119 Type 2 diabetes mellitus without complications: Secondary | ICD-10-CM | POA: Diagnosis present

## 2013-11-21 DIAGNOSIS — J8410 Pulmonary fibrosis, unspecified: Secondary | ICD-10-CM | POA: Diagnosis present

## 2013-11-21 DIAGNOSIS — J189 Pneumonia, unspecified organism: Secondary | ICD-10-CM | POA: Diagnosis present

## 2013-11-21 DIAGNOSIS — Z23 Encounter for immunization: Secondary | ICD-10-CM

## 2013-11-21 DIAGNOSIS — Z91199 Patient's noncompliance with other medical treatment and regimen due to unspecified reason: Secondary | ICD-10-CM

## 2013-11-21 DIAGNOSIS — J45909 Unspecified asthma, uncomplicated: Secondary | ICD-10-CM | POA: Diagnosis present

## 2013-11-21 DIAGNOSIS — J849 Interstitial pulmonary disease, unspecified: Secondary | ICD-10-CM | POA: Diagnosis present

## 2013-11-21 DIAGNOSIS — R079 Chest pain, unspecified: Secondary | ICD-10-CM | POA: Diagnosis present

## 2013-11-21 DIAGNOSIS — Z87820 Personal history of traumatic brain injury: Secondary | ICD-10-CM

## 2013-11-21 DIAGNOSIS — IMO0001 Reserved for inherently not codable concepts without codable children: Principal | ICD-10-CM | POA: Diagnosis present

## 2013-11-21 DIAGNOSIS — G40909 Epilepsy, unspecified, not intractable, without status epilepticus: Secondary | ICD-10-CM | POA: Diagnosis present

## 2013-11-21 LAB — CBC WITH DIFFERENTIAL/PLATELET
Basophils Absolute: 0 10*3/uL (ref 0.0–0.1)
Basophils Relative: 0 % (ref 0–1)
EOS ABS: 0.6 10*3/uL (ref 0.0–0.7)
Eosinophils Relative: 6 % — ABNORMAL HIGH (ref 0–5)
HEMATOCRIT: 38.9 % (ref 36.0–46.0)
Hemoglobin: 13.1 g/dL (ref 12.0–15.0)
Lymphocytes Relative: 33 % (ref 12–46)
Lymphs Abs: 3.5 10*3/uL (ref 0.7–4.0)
MCH: 31.9 pg (ref 26.0–34.0)
MCHC: 33.7 g/dL (ref 30.0–36.0)
MCV: 94.6 fL (ref 78.0–100.0)
MONO ABS: 0.5 10*3/uL (ref 0.1–1.0)
Monocytes Relative: 5 % (ref 3–12)
Neutro Abs: 5.8 10*3/uL (ref 1.7–7.7)
Neutrophils Relative %: 56 % (ref 43–77)
Platelets: 383 10*3/uL (ref 150–400)
RBC: 4.11 MIL/uL (ref 3.87–5.11)
RDW: 13.4 % (ref 11.5–15.5)
WBC: 10.5 10*3/uL (ref 4.0–10.5)

## 2013-11-21 LAB — COMPREHENSIVE METABOLIC PANEL
ALT: 17 U/L (ref 0–35)
AST: 23 U/L (ref 0–37)
Albumin: 3.9 g/dL (ref 3.5–5.2)
Alkaline Phosphatase: 129 U/L — ABNORMAL HIGH (ref 39–117)
BUN: 6 mg/dL (ref 6–23)
CALCIUM: 10.2 mg/dL (ref 8.4–10.5)
CO2: 25 meq/L (ref 19–32)
Chloride: 99 mEq/L (ref 96–112)
Creatinine, Ser: 0.7 mg/dL (ref 0.50–1.10)
GLUCOSE: 99 mg/dL (ref 70–99)
Potassium: 3.7 mEq/L (ref 3.7–5.3)
SODIUM: 140 meq/L (ref 137–147)
Total Bilirubin: 0.3 mg/dL (ref 0.3–1.2)
Total Protein: 8.4 g/dL — ABNORMAL HIGH (ref 6.0–8.3)

## 2013-11-21 LAB — CBC
HCT: 34.4 % — ABNORMAL LOW (ref 36.0–46.0)
HEMOGLOBIN: 12 g/dL (ref 12.0–15.0)
MCH: 32.8 pg (ref 26.0–34.0)
MCHC: 34.9 g/dL (ref 30.0–36.0)
MCV: 94 fL (ref 78.0–100.0)
Platelets: 340 10*3/uL (ref 150–400)
RBC: 3.66 MIL/uL — ABNORMAL LOW (ref 3.87–5.11)
RDW: 13.8 % (ref 11.5–15.5)
WBC: 9.4 10*3/uL (ref 4.0–10.5)

## 2013-11-21 LAB — CREATININE, SERUM
CREATININE: 0.59 mg/dL (ref 0.50–1.10)
GFR calc Af Amer: >90 mL/min (ref >90)
GFR calc non Af Amer: >90 mL/min (ref >90)

## 2013-11-21 LAB — PRO B NATRIURETIC PEPTIDE: Pro B Natriuretic peptide (BNP): 30.8 pg/mL (ref 0–125)

## 2013-11-21 LAB — SEDIMENTATION RATE: Sed Rate: 75 mm/hr — ABNORMAL HIGH (ref 0–22)

## 2013-11-21 LAB — ANGIOTENSIN CONVERTING ENZYME: Angiotensin-Converting Enzyme: 13 U/L (ref 8–52)

## 2013-11-21 LAB — TROPONIN I: Troponin I: <0.3 ng/mL (ref <0.30)

## 2013-11-21 LAB — RAPID HIV SCREEN (WH-MAU): Rapid HIV Screen: NONREACTIVE

## 2013-11-21 LAB — C-REACTIVE PROTEIN: CRP: 19.4 mg/dL — ABNORMAL HIGH (ref <0.60)

## 2013-11-21 LAB — PHENYTOIN LEVEL, TOTAL: Phenytoin Lvl: <2.5 ug/mL — ABNORMAL LOW (ref 10.0–20.0)

## 2013-11-21 MED ORDER — ALUM & MAG HYDROXIDE-SIMETH 200-200-20 MG/5ML PO SUSP
30.0000 mL | Freq: Four times a day (QID) | ORAL | Status: DC | PRN
  Administered 2013-11-24: 30 mL via ORAL
  Filled 2013-11-21: qty 30

## 2013-11-21 MED ORDER — PHENYTOIN SODIUM EXTENDED 100 MG PO CAPS
200.0000 mg | ORAL_CAPSULE | Freq: Two times a day (BID) | ORAL | Status: DC
  Administered 2013-11-22 – 2013-11-24 (×4): 200 mg via ORAL
  Filled 2013-11-21 (×5): qty 2

## 2013-11-21 MED ORDER — ENOXAPARIN SODIUM 40 MG/0.4ML ~~LOC~~ SOLN
40.0000 mg | SUBCUTANEOUS | Status: DC
  Administered 2013-11-21: 40 mg via SUBCUTANEOUS
  Filled 2013-11-21 (×2): qty 0.4

## 2013-11-21 MED ORDER — GUAIFENESIN-DM 100-10 MG/5ML PO SYRP
5.0000 mL | ORAL_SOLUTION | ORAL | Status: DC | PRN
  Administered 2013-11-21 – 2013-11-24 (×8): 5 mL via ORAL
  Filled 2013-11-21 (×8): qty 5

## 2013-11-21 MED ORDER — PHENYTOIN SODIUM EXTENDED 100 MG PO CAPS
200.0000 mg | ORAL_CAPSULE | Freq: Two times a day (BID) | ORAL | Status: DC
  Administered 2013-11-21: 200 mg via ORAL
  Filled 2013-11-21: qty 2

## 2013-11-21 MED ORDER — KETOROLAC TROMETHAMINE 30 MG/ML IJ SOLN
30.0000 mg | Freq: Four times a day (QID) | INTRAMUSCULAR | Status: DC | PRN
  Administered 2013-11-22 (×2): 30 mg via INTRAVENOUS
  Filled 2013-11-21 (×3): qty 1

## 2013-11-21 MED ORDER — SODIUM CHLORIDE 0.9 % IJ SOLN
3.0000 mL | Freq: Two times a day (BID) | INTRAMUSCULAR | Status: DC
  Administered 2013-11-22 – 2013-11-24 (×4): 3 mL via INTRAVENOUS

## 2013-11-21 MED ORDER — ACETAMINOPHEN 650 MG RE SUPP: 650.0000 mg | Freq: Four times a day (QID) | RECTAL | Status: DC | PRN

## 2013-11-21 MED ORDER — MORPHINE SULFATE 2 MG/ML IJ SOLN
1.0000 mg | INTRAMUSCULAR | Status: DC | PRN
  Administered 2013-11-21 – 2013-11-22 (×2): 1 mg via INTRAVENOUS
  Filled 2013-11-21 (×2): qty 1

## 2013-11-21 MED ORDER — MORPHINE SULFATE 4 MG/ML IJ SOLN
4.0000 mg | Freq: Once | INTRAMUSCULAR | Status: DC
  Filled 2013-11-21: qty 1

## 2013-11-21 MED ORDER — ALBUTEROL SULFATE (2.5 MG/3ML) 0.083% IN NEBU
5.0000 mg | INHALATION_SOLUTION | Freq: Once | RESPIRATORY_TRACT | Status: AC
  Administered 2013-11-21: 5 mg via RESPIRATORY_TRACT
  Filled 2013-11-21: qty 6

## 2013-11-21 MED ORDER — ONDANSETRON HCL 4 MG/2ML IJ SOLN: 4.0000 mg | Freq: Four times a day (QID) | INTRAMUSCULAR | Status: DC | PRN

## 2013-11-21 MED ORDER — ALBUTEROL SULFATE (2.5 MG/3ML) 0.083% IN NEBU
2.5000 mg | INHALATION_SOLUTION | Freq: Four times a day (QID) | RESPIRATORY_TRACT | Status: DC
  Administered 2013-11-21 – 2013-11-22 (×5): 2.5 mg via RESPIRATORY_TRACT
  Filled 2013-11-21 (×5): qty 3

## 2013-11-21 MED ORDER — ONDANSETRON HCL 4 MG PO TABS: 4.0000 mg | ORAL_TABLET | Freq: Four times a day (QID) | ORAL | Status: DC | PRN

## 2013-11-21 MED ORDER — ALBUTEROL SULFATE (2.5 MG/3ML) 0.083% IN NEBU: 2.5000 mg | INHALATION_SOLUTION | RESPIRATORY_TRACT | Status: DC | PRN

## 2013-11-21 MED ORDER — ACETAMINOPHEN 325 MG PO TABS
650.0000 mg | ORAL_TABLET | Freq: Four times a day (QID) | ORAL | Status: DC | PRN
  Administered 2013-11-21 – 2013-11-23 (×5): 650 mg via ORAL
  Filled 2013-11-21 (×5): qty 2

## 2013-11-21 MED ORDER — ALBUTEROL SULFATE (2.5 MG/3ML) 0.083% IN NEBU
INHALATION_SOLUTION | RESPIRATORY_TRACT | Status: AC
  Filled 2013-11-21: qty 3

## 2013-11-21 MED ORDER — PHENYTOIN SODIUM EXTENDED 100 MG PO CAPS
200.0000 mg | ORAL_CAPSULE | Freq: Once | ORAL | Status: AC
  Administered 2013-11-21: 200 mg via ORAL
  Filled 2013-11-21 (×2): qty 2

## 2013-11-21 MED ORDER — SODIUM CHLORIDE 0.9 % IV SOLN
INTRAVENOUS | Status: AC
  Administered 2013-11-21: 20:00:00 via INTRAVENOUS

## 2013-11-21 MED ORDER — LEVOFLOXACIN IN D5W 750 MG/150ML IV SOLN
750.0000 mg | Freq: Once | INTRAVENOUS | Status: AC
  Administered 2013-11-21: 750 mg via INTRAVENOUS
  Filled 2013-11-21: qty 150

## 2013-11-21 MED ORDER — PHENYTOIN SODIUM EXTENDED 100 MG PO CAPS
400.0000 mg | ORAL_CAPSULE | Freq: Once | ORAL | Status: AC
  Administered 2013-11-22: 400 mg via ORAL
  Filled 2013-11-21: qty 4

## 2013-11-21 MED ORDER — OXYCODONE-ACETAMINOPHEN 5-325 MG PO TABS
1.0000 | ORAL_TABLET | Freq: Once | ORAL | Status: AC
  Administered 2013-11-21: 1 via ORAL
  Filled 2013-11-21: qty 1

## 2013-11-21 MED ORDER — OXYCODONE HCL 5 MG PO TABS
5.0000 mg | ORAL_TABLET | ORAL | Status: DC | PRN
  Administered 2013-11-21 – 2013-11-22 (×2): 5 mg via ORAL
  Filled 2013-11-21 (×3): qty 1

## 2013-11-21 NOTE — ED Notes (Addendum)
Sob. States she was seen here yesterday with pneumonia and refused to be admitted. Worse today. She is rude at triage and does not want me to ask her any questions. Hyperventilating. She had Rx's for pneumonia from yesterday that she did not get filled.

## 2013-11-21 NOTE — ED Notes (Signed)
Into treatment room for patient EKG. Patient asked why the test had to be done. I explained to the patient it was an order per MD and she had the right to refuse. She stated she did not have insurance and did not want to pay for another EKG, stating "you just did this yesterday. I know it ain't my heart."  Patient given the option to follow through with the test and she aggressively threw back her gown stating "just do it". EKG done with permission from patient and given to EDP for confirmation. Patient requests to be warmer. Thermostat in room adjusted to her comfort.

## 2013-11-21 NOTE — ED Provider Notes (Signed)
CSN: [id number]     Arrival date & time 11/21/13  1358 History   First MD Initiated Contact with Patient 11/21/13 1419     Chief Complaint  Patient presents with  . Shortness of Breath    Patient is a 38 y.o. female presenting with shortness of breath. The history is provided by the patient.  Shortness of Breath Severity:  Moderate Onset quality:  Gradual Duration: several days ago. Timing:  Constant Progression:  Worsening Relieved by:  Rest Worsened by:  Activity Associated symptoms: chest pain and cough   pt report worsening cough and SOB over the past several days She also reports pleuritic type CP She was seen in ED yesterday, diagnosed with pneumonia and advised admission but she refused to be admitted.  She reports since that time her symptoms are worsening and she is unable to afford the antibiotic.    Past Medical History  Diagnosis Date  . Migraine   . Seizures   . Brain injury   . Diabetes mellitus without complication   . Asthma   . Bronchitis, chronic    Past Surgical History  Procedure Laterality Date  . Tubal ligation    . Traumatic brain injury     No family history on file. History  Substance Use Topics  . Smoking status: Current Every Day Smoker -- 0.50 packs/day    Types: Cigarettes  . Smokeless tobacco: Not on file  . Alcohol Use: No   OB History   Grav Para Term Preterm Abortions TAB SAB Ect Mult Living                 Review of Systems  Respiratory: Positive for cough and shortness of breath.   Cardiovascular: Positive for chest pain.  All other systems reviewed and are negative.    Allergies  Cephalosporins; Penicillins; Erythromycin; Tramadol; and Vicodin  Home Medications   Current Outpatient Rx  Name  Route  Sig  Dispense  Refill  . amitriptyline (ELAVIL) 75 MG tablet   Oral   Take 75 mg by mouth at bedtime.         Marland Kitchen azithromycin (ZITHROMAX) 250 MG tablet   Oral   Take 1 tablet (250 mg total) by mouth daily. Take first 2  tablets together, then 1 every day until finished.   6 tablet   0   . citalopram (CELEXA) 20 MG tablet   Oral   Take 20 mg by mouth daily.         . clindamycin (CLEOCIN) 150 MG capsule   Oral   Take 1 capsule (150 mg total) by mouth 4 (four) times daily.   28 capsule   0   . doxycycline (VIBRAMYCIN) 100 MG capsule   Oral   Take 1 capsule (100 mg total) by mouth 2 (two) times daily.   14 capsule   0   . ibuprofen (ADVIL,MOTRIN) 800 MG tablet   Oral   Take 1 tablet (800 mg total) by mouth 3 (three) times daily.   21 tablet   0   . ibuprofen (ADVIL,MOTRIN) 800 MG tablet   Oral   Take 1 tablet (800 mg total) by mouth 3 (three) times daily.   21 tablet   0   . levofloxacin (LEVAQUIN) 750 MG tablet   Oral   Take 1 tablet (750 mg total) by mouth daily. X 7 days   7 tablet   0   . levofloxacin (LEVAQUIN) 750 MG tablet   Oral  Take 1 tablet (750 mg total) by mouth daily. X 7 days   10 tablet   0   . Metoclopramide HCl (REGLAN PO)   Oral   Take 1 tablet by mouth daily as needed. For nausea.         Marland Kitchen oxyCODONE-acetaminophen (PERCOCET/ROXICET) 5-325 MG per tablet   Oral   Take 1-2 tablets by mouth every 6 (six) hours as needed for pain.   15 tablet   0   . oxyCODONE-acetaminophen (PERCOCET/ROXICET) 5-325 MG per tablet   Oral   Take 1 tablet by mouth every 4 (four) hours as needed for pain.   15 tablet   0   . oxyCODONE-acetaminophen (PERCOCET/ROXICET) 5-325 MG per tablet   Oral   Take 1-2 tablets by mouth every 4 (four) hours as needed for severe pain.   10 tablet   0   . oxyCODONE-acetaminophen (PERCOCET/ROXICET) 5-325 MG per tablet   Oral   Take 1 tablet by mouth every 6 (six) hours as needed for moderate pain or severe pain (cough).   15 tablet   0   . phenytoin (DILANTIN) 100 MG ER capsule   Oral   Take 2 capsules (200 mg total) by mouth 2 (two) times daily.   30 capsule   1   . predniSONE (DELTASONE) 20 MG tablet   Oral   Take 2 tablets  (40 mg total) by mouth daily.   10 tablet   0   . traZODone (DESYREL) 150 MG tablet   Oral   Take 150 mg by mouth at bedtime.          BP 134/89  Pulse 99  Temp(Src) 98.3 F (36.8 C) (Oral)  Resp 24  Ht 5\' 1"  (1.549 m)  Wt 145 lb (65.772 kg)  BMI 27.41 kg/m2  SpO2 99%  LMP 11/06/2013 Physical Exam CONSTITUTIONAL: Well developed/well nourished HEAD: Normocephalic/atraumatic EYES: EOMI/PERRL ENMT: Mucous membranes moist NECK: supple no meningeal signs SPINE:entire spine nontender CV: S1/S2 noted, no murmurs/rubs/gallops noted LUNGS: tachypnea, decreased BS noted bilaterally ABDOMEN: soft, nontender, no rebound or guarding GU:no cva tenderness NEURO: Pt is awake/alert, moves all extremitiesx4 EXTREMITIES: pulses normal, full ROM SKIN: warm, color normal PSYCH:anxious  ED Course  Procedures (including critical care time) Labs Review Labs Reviewed - No data to display Imaging Review Dg Chest 2 View  11/20/2013   CLINICAL DATA:  Severe mid chest pain with cough, shortness of breath. History of asthma.  EXAM: CHEST  2 VIEW  COMPARISON:  Back 04/05/2013 and 09/16/2011.  FINDINGS: The heart size and mediastinal contours are stable. There are persistent perihilar and basilar pulmonary opacities bilaterally with an asymmetric component on the right. Compared with the study from 4 months ago, there has been partial clearing on the left. There is no pleural effusion. The osseous structures appear unchanged.  IMPRESSION: Persistent or recurrent perihilar and lower lobe airspace opacities bilaterally. Similar findings or demonstrated 4 months ago. Findings could reflect recurrent pneumonia, atypical edema or a more indolent process such as pulmonary hemorrhage. If the etiology for the findings has not been determined, CT may be helpful for further evaluation.   Electronically Signed   By: Camie Patience M.D.   On: 11/20/2013 08:37   Ct Angio Chest Pe W/cm &/or Wo Cm  11/20/2013   CLINICAL  DATA:  Chest pain. Shortness of breath.  Dyspnea.  EXAM: CT ANGIOGRAPHY CHEST WITH CONTRAST  TECHNIQUE: Multidetector CT imaging of the chest was performed using the standard  protocol during bolus administration of intravenous contrast. Multiplanar CT image reconstructions including MIPs were obtained to evaluate the vascular anatomy.  CONTRAST:  5mL OMNIPAQUE IOHEXOL 350 MG/ML SOLN  COMPARISON:  11/20/2013; 09/14/2011  FINDINGS: No filling defect is identified in the pulmonary arterial tree to suggest pulmonary embolus. No acute aortic dissection. No pathologic thoracic adenopathy.  Bilateral scattered ground-glass opacities are observed, affecting groups secondary pulmonary lobules in a confluent appearance, but with various on affected regions of lung interspersed. This appearance was not present on the prior CT scan from 2012, but similar findings, especially in the lower lobes, or present on 07/22/2013.  Review of the MIP images confirms the above findings.  IMPRESSION: 1. Confluent ground-glass densities in both lungs, potentially progressing over the last 3-4 months based on the imaging record. 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 Entities such as hypersensitivity pneumonitis, chronic eosinophilic pneumonia, and alveolar proteinosis are less likely given the appearance. 2. No filling defect is identified in the pulmonary arterial tree to suggest pulmonary embolus.   Electronically Signed   By: Sherryl Barters M.D.   On: 11/20/2013 11:08    EKG Interpretation   None     3:38 PM Pt with persistent pneumonia with worsening SOB and also has pleuritic CP (no PE as of CT scan yesterday) Will admit for IV antibiotics as she is unable to take meds as outpatient D/w dr Sloan Leiter, will admit cone He requests sed rate, crp, cmp, cbc, HIV testing   MDM  No diagnosis found. Nursing notes including past medical history and social history reviewed and considered in documentation Previous records  reviewed and considered - recent ED visit reviewed xrays reviewed and considered    Date: 11/21/2013  Rate: 90  Rhythm: normal sinus rhythm  QRS Axis: normal  Intervals: normal  ST/T Wave abnormalities: normal  Conduction Disutrbances:none    Sharyon Cable, MD 11/21/13 1540
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
## 2013-11-21 NOTE — ED Notes (Signed)
O2 sat 94-96% while walking in hall.  HR up to 106.  Pt c/o severe pain
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
## 2013-11-21 NOTE — ED Notes (Signed)
Patient to treatment room 10 with steady gait, and without difficulty or assistance. Patient continues to hyperventilate. She is encouraged to slow her breathing. On arrival to treatment room, patient encouraged to undress and gown up for MD assessment. She stated there was no reason for her to get undressed, that she was just here yesterday and "oh my god!" Assigned RN made aware of patient refusal to get undressed.

## 2013-11-21 NOTE — Progress Notes (Signed)
38 year old female with reported history of asthma evaluated at med center [facility] for shortness of breath and cough. She was found to have a white count of 20,000 yesterday, didn't want to get admitted and came back today for ongoing symptoms of cough and shortness of breath. Otherwise stable. Chest x-ray shows bilateral infiltrates-similar infiltrates were seen and x-rays till last September. Not sure if this is pneumonia on top of a chronic changes. Per ED M.D., stable for MedSurg bed. Have asked them to repeat CBC M.D. meds today, also to get HIV, ESR and ACE level. This patient will need pulmonary consultation when patient gets to [place].

## 2013-11-22 DIAGNOSIS — J8410 Pulmonary fibrosis, unspecified: Secondary | ICD-10-CM

## 2013-11-22 DIAGNOSIS — J189 Pneumonia, unspecified organism: Secondary | ICD-10-CM

## 2013-11-22 LAB — COMPREHENSIVE METABOLIC PANEL
ALBUMIN: 2.9 g/dL — AB (ref 3.5–5.2)
ALT: 12 U/L (ref 0–35)
AST: 12 U/L (ref 0–37)
Alkaline Phosphatase: 106 U/L (ref 39–117)
BUN: 6 mg/dL (ref 6–23)
CHLORIDE: 102 meq/L (ref 96–112)
CO2: 22 meq/L (ref 19–32)
CREATININE: 0.63 mg/dL (ref 0.50–1.10)
Calcium: 8.7 mg/dL (ref 8.4–10.5)
GFR calc Af Amer: >90 mL/min (ref >90)
Glucose, Bld: 180 mg/dL — ABNORMAL HIGH (ref 70–99)
Potassium: 3.6 mEq/L — ABNORMAL LOW (ref 3.7–5.3)
Sodium: 140 mEq/L (ref 137–147)
Total Protein: 6.7 g/dL (ref 6.0–8.3)

## 2013-11-22 LAB — ANA: Anti Nuclear Antibody(ANA): NEGATIVE

## 2013-11-22 LAB — CBC
HEMATOCRIT: 33.2 % — AB (ref 36.0–46.0)
Hemoglobin: 11.5 g/dL — ABNORMAL LOW (ref 12.0–15.0)
MCH: 32.8 pg (ref 26.0–34.0)
MCHC: 34.6 g/dL (ref 30.0–36.0)
MCV: 94.6 fL (ref 78.0–100.0)
Platelets: 336 10*3/uL (ref 150–400)
RBC: 3.51 MIL/uL — ABNORMAL LOW (ref 3.87–5.11)
RDW: 13.8 % (ref 11.5–15.5)
WBC: 6.5 10*3/uL (ref 4.0–10.5)

## 2013-11-22 LAB — LEGIONELLA ANTIGEN, URINE: LEGIONELLA ANTIGEN, URINE: NEGATIVE

## 2013-11-22 LAB — TROPONIN I
Troponin I: <0.3 ng/mL (ref <0.30)
Troponin I: <0.3 ng/mL (ref <0.30)

## 2013-11-22 LAB — STREP PNEUMONIAE URINARY ANTIGEN: Strep Pneumo Urinary Antigen: NEGATIVE

## 2013-11-22 LAB — RHEUMATOID FACTOR: Rhuematoid fact SerPl-aCnc: <10 IU/mL (ref <=14)

## 2013-11-22 MED ORDER — ALBUTEROL SULFATE (2.5 MG/3ML) 0.083% IN NEBU
2.5000 mg | INHALATION_SOLUTION | Freq: Two times a day (BID) | RESPIRATORY_TRACT | Status: DC
  Administered 2013-11-23 – 2013-11-24 (×2): 2.5 mg via RESPIRATORY_TRACT
  Filled 2013-11-22 (×2): qty 3

## 2013-11-22 MED ORDER — MORPHINE SULFATE 2 MG/ML IJ SOLN
1.0000 mg | INTRAMUSCULAR | Status: DC | PRN
  Administered 2013-11-24: 1 mg via INTRAVENOUS
  Filled 2013-11-22: qty 1

## 2013-11-22 MED ORDER — METHYLPREDNISOLONE SODIUM SUCC 40 MG IJ SOLR
40.0000 mg | Freq: Two times a day (BID) | INTRAMUSCULAR | Status: DC
  Administered 2013-11-22 – 2013-11-24 (×4): 40 mg via INTRAVENOUS
  Filled 2013-11-22 (×7): qty 1

## 2013-11-22 MED ORDER — LEVOFLOXACIN IN D5W 750 MG/150ML IV SOLN
750.0000 mg | INTRAVENOUS | Status: DC
  Administered 2013-11-22 – 2013-11-23 (×2): 750 mg via INTRAVENOUS
  Filled 2013-11-22 (×3): qty 150

## 2013-11-22 MED ORDER — OXYCODONE HCL 5 MG PO TABS
10.0000 mg | ORAL_TABLET | ORAL | Status: DC | PRN
  Administered 2013-11-22 – 2013-11-24 (×11): 10 mg via ORAL
  Filled 2013-11-22 (×11): qty 2

## 2013-11-22 MED ORDER — MELOXICAM 15 MG PO TABS
15.0000 mg | ORAL_TABLET | Freq: Every day | ORAL | Status: DC
  Administered 2013-11-22 – 2013-11-24 (×3): 15 mg via ORAL
  Filled 2013-11-22 (×3): qty 1

## 2013-11-22 NOTE — Progress Notes (Signed)
PATIENT DETAILS Name: Christine Campbell Age: 38 y.o. Sex: female Date of Birth: [birth year] Admit Date: 11/21/2013 Admitting Physician Evalee Mutton Kristeen Mans, MD PCP:No PCP Per Patient  Subjective: Continues to have pleuritic chest pain  Assessment/Plan: Principal Problem:   Chest pain -pleuritic-essentially unchanged compared to admission -not sure if caused by underlying interstitial lung disease -Troponin X 3 neg, EKG Neg, CTA Chest on 1/4 neg for Pul Embolism -provide supportive care with NSAID's- start Mobic, prn Toradol, and narcotics  Groundglass bilateral lung opacities  -Suspect these to be chronic in nature, may not be responsible for patient's ongoing chest pain.  - Not sure if any acute component to this. But on Empiric Levaquin -HIV neg, ESR elevated at 75, ACE level 13, ANA and RA Factor neg -await PCCM eval-holding off on steroids till seen by PCCM  Hx Of Seizures -Dilantin per Pharmacy  Disposition: Remain inpatient  DVT Prophylaxis: Prophylactic Lovenox  Code Status: Full code   Family Communication None at bedside  Procedures:  None  CONSULTS:  pulmonary/intensive care  Time spent 40 minutes-which includes 50% of the time with face-to-face with patient-discussing further plan of care, reviewing labs and radiologic studies   MEDICATIONS: Scheduled Meds: . albuterol  2.5 mg Nebulization Q6H  . enoxaparin (LOVENOX) injection  40 mg Subcutaneous Q24H  . levofloxacin (LEVAQUIN) IV  750 mg Intravenous Q24H  . phenytoin  200 mg Oral BID  . sodium chloride  3 mL Intravenous Q12H   Continuous Infusions:  PRN Meds:.acetaminophen, acetaminophen, albuterol, alum & mag hydroxide-simeth, guaiFENesin-dextromethorphan, ketorolac, morphine injection, morphine injection, ondansetron (ZOFRAN) IV, ondansetron, oxyCODONE  Antibiotics: Anti-infectives   Start     Dose/Rate Route Frequency Ordered Stop   11/22/13 1500  levofloxacin (LEVAQUIN) IVPB 750 mg      750 mg 100 mL/hr over 90 Minutes Intravenous Every 24 hours 11/22/13 0853     11/21/13 1515  levofloxacin (LEVAQUIN) IVPB 750 mg     750 mg 100 mL/hr over 90 Minutes Intravenous  Once 11/21/13 1501 11/21/13 1653       PHYSICAL EXAM: Vital signs in last 24 hours: Filed Vitals:   11/21/13 2034 11/21/13 2050 11/22/13 0433 11/22/13 0905  BP: 118/68  100/57 122/85  Pulse: 97  78 81  Temp: 98.6 F (37 C)  98.7 F (37.1 C) 98.2 F (36.8 C)  TempSrc: Oral  Oral Oral  Resp: '22  20 18  ' Height: '5\' 1"'  (1.549 m)     Weight: 65.772 kg (145 lb)     SpO2: 97% 92% 97% 96%    Weight change:  Filed Weights   11/21/13 1405 11/21/13 2034  Weight: 65.772 kg (145 lb) 65.772 kg (145 lb)   Body mass index is 27.41 kg/(m^2).   Gen Exam: Awake and alert with clear speech.   Neck: Supple, No JVD.  Chest: B/L Clear-except inspiratory and expiratory rales  CVS: S1 S2 Regular, no murmurs.  Abdomen: soft, BS +, non tender, non distended.  Extremities: no edema, lower extremities warm to touch. Neurologic: Non Focal.   Skin: No Rash.   Wounds: N/A.    Intake/Output from previous day:  Intake/Output Summary (Last 24 hours) at 11/22/13 1320 Last data filed at 11/22/13 1100  Gross per 24 hour  Intake 2080.83 ml  Output      0 ml  Net 2080.83 ml     LAB RESULTS: CBC  Recent Labs Lab 11/20/13 0905 11/21/13 1530 11/21/13 2014 11/22/13 0947  WBC 20.1* 10.5 9.4 6.5  HGB 13.0 13.1 12.0 11.5*  HCT 37.4 38.9 34.4* 33.2*  PLT 362 383 340 336  MCV 94.0 94.6 94.0 94.6  MCH 32.7 31.9 32.8 32.8  MCHC 34.8 33.7 34.9 34.6  RDW 13.7 13.4 13.8 13.8  LYMPHSABS  --  3.5  --   --   MONOABS  --  0.5  --   --   EOSABS  --  0.6  --   --   BASOSABS  --  0.0  --   --     Chemistries   Recent Labs Lab 11/20/13 0905 11/21/13 1530 11/21/13 2014 11/22/13 0947  NA 142 140  --  140  K 4.1 3.7  --  3.6*  CL 107 99  --  102  CO2 20 25  --  22  GLUCOSE 124* 99  --  180*  BUN 5* 6  --  6   CREATININE 0.60 0.70 0.59 0.63  CALCIUM 9.5 10.2  --  8.7    CBG:  Recent Labs Lab 11/20/13 0905  GLUCAP 98    GFR Estimated Creatinine Clearance: 84.4 ml/min (by C-G formula based on Cr of 0.63).  Coagulation profile No results found for this basename: INR, PROTIME,  in the last 168 hours  Cardiac Enzymes  Recent Labs Lab 11/21/13 2014 11/22/13 0130 11/22/13 0948  TROPONINI <0.30 <0.30 <0.30    No components found with this basename: POCBNP,   Recent Labs  11/20/13 0905  DDIMER 0.50*   No results found for this basename: HGBA1C,  in the last 72 hours No results found for this basename: CHOL, HDL, LDLCALC, TRIG, CHOLHDL, LDLDIRECT,  in the last 72 hours No results found for this basename: TSH, T4TOTAL, FREET3, T3FREE, THYROIDAB,  in the last 72 hours No results found for this basename: VITAMINB12, FOLATE, FERRITIN, TIBC, IRON, RETICCTPCT,  in the last 72 hours No results found for this basename: LIPASE, AMYLASE,  in the last 72 hours  Urine Studies No results found for this basename: UACOL, UAPR, USPG, UPH, UTP, UGL, UKET, UBIL, UHGB, UNIT, UROB, ULEU, UEPI, UWBC, URBC, UBAC, CAST, CRYS, UCOM, BILUA,  in the last 72 hours  MICROBIOLOGY: Recent Results (from the past 240 hour(s))  CULTURE, BLOOD (ROUTINE X 2)     Status: None   Collection Time    11/20/13 12:00 PM      Result Value Range Status   Specimen Description BLOOD RIGHT HAND   Final   Special Requests Normal BOTTLES DRAWN AEROBIC AND ANAEROBIC 4CC   Final   Culture  Setup Time     Final   Value: 11/20/2013 19:28     Performed at [facility]   Culture     Final   Value:        BLOOD CULTURE RECEIVED NO GROWTH TO DATE CULTURE WILL BE HELD FOR 5 DAYS BEFORE ISSUING A FINAL NEGATIVE REPORT     Performed at [facility]   Report Status PENDING   Incomplete    RADIOLOGY STUDIES/RESULTS: Dg Chest 2 View  11/21/2013   CLINICAL DATA:  Shortness of breath  EXAM: CHEST  2 VIEW  COMPARISON:   CT performed on 11/20/2013  FINDINGS: Cardiac and mediastinal silhouettes are stable in size and contour, and remain within normal limits.  Patchy bilateral airspace opacities are again seen, most prominent within the bilateral lung bases. Patchy right upper lobe opacity is also visualized. These findings are better evaluated on recent CT performed 1 day prior.  Findings remain concerning for possible multi focal pneumonia. No pulmonary edema or pleural effusion. No pneumothorax.  Osseous structures are unchanged.  IMPRESSION: Persistent bilateral patchy airspace opacities, worrisome for multi focal pneumonia. These findings are better evaluated on the CT of the chest performed 1 day prior on 11/20/2013.   Electronically Signed   By: Jeannine Boga M.D.   On: 11/21/2013 15:08   Dg Chest 2 View  11/20/2013   CLINICAL DATA:  Severe mid chest pain with cough, shortness of breath. History of asthma.  EXAM: CHEST  2 VIEW  COMPARISON:  Back 04/05/2013 and 09/16/2011.  FINDINGS: The heart size and mediastinal contours are stable. There are persistent perihilar and basilar pulmonary opacities bilaterally with an asymmetric component on the right. Compared with the study from 4 months ago, there has been partial clearing on the left. There is no pleural effusion. The osseous structures appear unchanged.  IMPRESSION: Persistent or recurrent perihilar and lower lobe airspace opacities bilaterally. Similar findings or demonstrated 4 months ago. Findings could reflect recurrent pneumonia, atypical edema or a more indolent process such as pulmonary hemorrhage. If the etiology for the findings has not been determined, CT may be helpful for further evaluation.   Electronically Signed   By: Camie Patience M.D.   On: 11/20/2013 08:37   Ct Angio Chest Pe W/cm &/or Wo Cm  11/20/2013   CLINICAL DATA:  Chest pain. Shortness of breath.  Dyspnea.  EXAM: CT ANGIOGRAPHY CHEST WITH CONTRAST  TECHNIQUE: Multidetector CT imaging of the  chest was performed using the standard protocol during bolus administration of intravenous contrast. Multiplanar CT image reconstructions including MIPs were obtained to evaluate the vascular anatomy.  CONTRAST:  52m OMNIPAQUE IOHEXOL 350 MG/ML SOLN  COMPARISON:  11/20/2013; 09/14/2011  FINDINGS: No filling defect is identified in the pulmonary arterial tree to suggest pulmonary embolus. No acute aortic dissection. No pathologic thoracic adenopathy.  Bilateral scattered ground-glass opacities are observed, affecting groups secondary pulmonary lobules in a confluent appearance, but with various on affected regions of lung interspersed. This appearance was not present on the prior CT scan from 2012, but similar findings, especially in the lower lobes, or present on 07/22/2013.  Review of the MIP images confirms the above findings.  IMPRESSION: 1. Confluent ground-glass densities in both lungs, potentially progressing over the last 3-4 months based on the imaging record. 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 Entities such as hypersensitivity pneumonitis, chronic eosinophilic pneumonia, and alveolar proteinosis are less likely given the appearance. 2. No filling defect is identified in the pulmonary arterial tree to suggest pulmonary embolus.   Electronically Signed   By: WSherryl BartersM.D.   On: 11/20/2013 11:08    GOren Binet MD  [facility] Pager:[phone] If 7PM-7AM, please contact night-coverage [email or web address] Password [email or web address] 11/22/2013, 1:20 PM   LOS: 1 day
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
## 2013-11-23 ENCOUNTER — Inpatient Hospital Stay (HOSPITAL_COMMUNITY): Payer: Self-pay

## 2013-11-23 ENCOUNTER — Encounter (HOSPITAL_COMMUNITY)
Admission: EM | Disposition: A | Discharge status: Home / Self Care | Payer: Self-pay | Source: Home / Self Care | Attending: Internal Medicine

## 2013-11-23 HISTORY — PX: VIDEO BRONCHOSCOPY: SHX5072

## 2013-11-23 SURGERY — BRONCHOSCOPY, WITH FLUOROSCOPY
Anesthesia: Moderate Sedation | Laterality: Bilateral

## 2013-11-23 MED ORDER — LIDOCAINE HCL 1 % IJ SOLN
INTRAMUSCULAR | Status: DC | PRN
  Administered 2013-11-23: 6 mL via RESPIRATORY_TRACT

## 2013-11-23 MED ORDER — FENTANYL CITRATE 0.05 MG/ML IJ SOLN
INTRAMUSCULAR | Status: DC | PRN
  Administered 2013-11-23 (×7): 25 ug via INTRAVENOUS

## 2013-11-23 MED ORDER — FENTANYL CITRATE 0.05 MG/ML IJ SOLN
INTRAMUSCULAR | Status: AC
Start: 2013-11-23 — End: 2013-11-23
  Filled 2013-11-23: qty 4

## 2013-11-23 MED ORDER — MIDAZOLAM HCL 10 MG/2ML IJ SOLN
INTRAMUSCULAR | Status: DC | PRN
  Administered 2013-11-23 (×8): 1 mg via INTRAVENOUS

## 2013-11-23 MED ORDER — INFLUENZA VAC SPLIT QUAD 0.5 ML IM SUSP
0.5000 mL | INTRAMUSCULAR | Status: AC
  Administered 2013-11-24: 0.5 mL via INTRAMUSCULAR
  Filled 2013-11-23: qty 0.5

## 2013-11-23 MED ORDER — MIDAZOLAM HCL 5 MG/ML IJ SOLN
INTRAMUSCULAR | Status: AC
  Filled 2013-11-23: qty 2

## 2013-11-23 MED ORDER — PHENYLEPHRINE HCL 0.25 % NA SOLN
NASAL | Status: DC | PRN
  Administered 2013-11-23: 1 via NASAL

## 2013-11-23 MED ORDER — SODIUM CHLORIDE 0.9 % IV SOLN
INTRAVENOUS | Status: DC
  Administered 2013-11-23: 10:00:00 via INTRAVENOUS

## 2013-11-23 MED ORDER — LIDOCAINE HCL 2 % EX GEL
CUTANEOUS | Status: DC | PRN
  Administered 2013-11-23: 1

## 2013-11-23 NOTE — Progress Notes (Signed)
PATIENT DETAILS Name: Christine Campbell Age: 38 y.o. Sex: female Date of Birth: 09/17/1976 Admit Date: 11/21/2013 Admitting Physician Evalee Mutton Kristeen Mans, MD PCP:No PCP Per Patient  Subjective: Continues to have pleuritic chest pain-but somewhat better than on admission  Assessment/Plan: Principal Problem:   Chest pain -pleuritic-essentially unchanged compared to admission -not sure if caused by underlying interstitial lung disease -Troponin X 3 neg, EKG Neg, CTA Chest on 1/4 neg for Pul Embolism. Will check Echo -provide supportive care with NSAID's- started Mobic, prn Toradol, and narcotics  Groundglass bilateral lung opacities  -Suspect these to be chronic in nature, may not be responsible for patient's ongoing chest pain.  - Not sure if any acute component to this. But on Empiric Levaquin -HIV neg, ESR elevated at 75, ACE level 13, ANA and RA Factor neg -PCCM attempted Bronchoscopy-not successful because unable to be sedated. Per Dr Elsworth Soho, to empirically start steroids and repeat CXR in 3 weeks or so.If infiltrates do not resolve, then will need Bronch with gen anesthesia.  Hx Of Seizures -Dilantin per Pharmacy  Disposition: Remain inpatient-suspect home in 1-2 days  DVT Prophylaxis: Prophylactic Lovenox  Code Status: Full code   Family Communication None at bedside  Procedures:  None  CONSULTS:  pulmonary/intensive care  Time spent 40 minutes-which includes 50% of the time with face-to-face with patient-discussing further plan of care, reviewing labs and radiologic studies   MEDICATIONS: Scheduled Meds: . albuterol  2.5 mg Nebulization BID  . levofloxacin (LEVAQUIN) IV  750 mg Intravenous Q24H  . meloxicam  15 mg Oral Daily  . methylPREDNISolone (SOLU-MEDROL) injection  40 mg Intravenous Q12H  . phenytoin  200 mg Oral BID  . sodium chloride  3 mL Intravenous Q12H   Continuous Infusions: . sodium chloride 20 mL/hr at 11/23/13 0931   PRN  Meds:.acetaminophen, acetaminophen, albuterol, alum & mag hydroxide-simeth, guaiFENesin-dextromethorphan, ketorolac, morphine injection, morphine injection, ondansetron (ZOFRAN) IV, ondansetron, oxyCODONE  Antibiotics: Anti-infectives   Start     Dose/Rate Route Frequency Ordered Stop   11/22/13 1500  levofloxacin (LEVAQUIN) IVPB 750 mg     750 mg 100 mL/hr over 90 Minutes Intravenous Every 24 hours 11/22/13 0853     11/21/13 1515  levofloxacin (LEVAQUIN) IVPB 750 mg     750 mg 100 mL/hr over 90 Minutes Intravenous  Once 11/21/13 1501 11/21/13 1653       PHYSICAL EXAM: Vital signs in last 24 hours: Filed Vitals:   11/23/13 1145 11/23/13 1150 11/23/13 1155 11/23/13 1201  BP: 107/67 103/60 111/69 122/67  Pulse:      Temp:      TempSrc:      Resp: _0 Height:      Weight:      SpO2: 99% 98% 98% 100%    Weight change: 0 kg (0 lb) Filed Weights   11/21/13 1405 11/21/13 2034 11/22/13 2032  Weight: 65.772 kg (145 lb) 65.772 kg (145 lb) 65.772 kg (145 lb)   Body mass index is 27.41 kg/(m^2).   Gen Exam: Awake and alert with clear speech.   Neck: Supple, No JVD.  Chest: B/L Clear-except inspiratory and expiratory rales  CVS: S1 S2 Regular, no murmurs.  Abdomen: soft, BS +, non tender, non distended.  Extremities: no edema, lower extremities warm to touch. Neurologic: Non Focal.   Skin: No Rash.   Wounds: N/A.    Intake/Output from previous day:  Intake/Output Summary (Last 24 hours) at 11/23/13 1338 Last data filed at 11/23/13  5809  Gross per 24 hour  Intake   1130 ml  Output      2 ml  Net   1128 ml     LAB RESULTS: CBC  Recent Labs Lab 11/20/13 0905 11/21/13 1530 11/21/13 2014 11/22/13 0947  WBC 20.1* 10.5 9.4 6.5  HGB 13.0 13.1 12.0 11.5*  HCT 37.4 38.9 34.4* 33.2*  PLT 362 383 340 336  MCV 94.0 94.6 94.0 94.6  MCH 32.7 31.9 32.8 32.8  MCHC 34.8 33.7 34.9 34.6  RDW 13.7 13.4 13.8 13.8  LYMPHSABS  --  3.5  --   --   MONOABS  --  0.5  --    --   EOSABS  --  0.6  --   --   BASOSABS  --  0.0  --   --     Chemistries   Recent Labs Lab 11/20/13 0905 11/21/13 1530 11/21/13 2014 11/22/13 0947  NA 142 140  --  140  K 4.1 3.7  --  3.6*  CL 107 99  --  102  CO2 20 25  --  22  GLUCOSE 124* 99  --  180*  BUN 5* 6  --  6  CREATININE 0.60 0.70 0.59 0.63  CALCIUM 9.5 10.2  --  8.7    CBG:  Recent Labs Lab 11/20/13 0905  GLUCAP 98    GFR Estimated Creatinine Clearance: 84.4 ml/min (by C-G formula based on Cr of 0.63).  Coagulation profile No results found for this basename: INR, PROTIME,  in the last 168 hours  Cardiac Enzymes  Recent Labs Lab 11/21/13 2014 11/22/13 0130 11/22/13 0948  TROPONINI <0.30 <0.30 <0.30    No components found with this basename: POCBNP,  No results found for this basename: DDIMER,  in the last 72 hours No results found for this basename: HGBA1C,  in the last 72 hours No results found for this basename: CHOL, HDL, LDLCALC, TRIG, CHOLHDL, LDLDIRECT,  in the last 72 hours No results found for this basename: TSH, T4TOTAL, FREET3, T3FREE, THYROIDAB,  in the last 72 hours No results found for this basename: VITAMINB12, FOLATE, FERRITIN, TIBC, IRON, RETICCTPCT,  in the last 72 hours No results found for this basename: LIPASE, AMYLASE,  in the last 72 hours  Urine Studies No results found for this basename: UACOL, UAPR, USPG, UPH, UTP, UGL, UKET, UBIL, UHGB, UNIT, UROB, ULEU, UEPI, UWBC, URBC, UBAC, CAST, CRYS, UCOM, BILUA,  in the last 72 hours  MICROBIOLOGY: Recent Results (from the past 240 hour(s))  CULTURE, BLOOD (ROUTINE X 2)     Status: None   Collection Time    11/20/13 12:00 PM      Result Value Range Status   Specimen Description BLOOD RIGHT HAND   Final   Special Requests Normal BOTTLES DRAWN AEROBIC AND ANAEROBIC 4CC   Final   Culture  Setup Time     Final   Value: 11/20/2013 19:28     Performed at [facility]   Culture     Final   Value:        BLOOD CULTURE  RECEIVED NO GROWTH TO DATE CULTURE WILL BE HELD FOR 5 DAYS BEFORE ISSUING A FINAL NEGATIVE REPORT     Performed at [facility]   Report Status PENDING   Incomplete    RADIOLOGY STUDIES/RESULTS: Dg Chest 2 View  11/21/2013   CLINICAL DATA:  Shortness of breath  EXAM: CHEST  2 VIEW  COMPARISON:  CT  performed on 11/20/2013  FINDINGS: Cardiac and mediastinal silhouettes are stable in size and contour, and remain within normal limits.  Patchy bilateral airspace opacities are again seen, most prominent within the bilateral lung bases. Patchy right upper lobe opacity is also visualized. These findings are better evaluated on recent CT performed 1 day prior. Findings remain concerning for possible multi focal pneumonia. No pulmonary edema or pleural effusion. No pneumothorax.  Osseous structures are unchanged.  IMPRESSION: Persistent bilateral patchy airspace opacities, worrisome for multi focal pneumonia. These findings are better evaluated on the CT of the chest performed 1 day prior on 11/20/2013.   Electronically Signed   By: Jeannine Boga M.D.   On: 11/21/2013 15:08   Dg Chest 2 View  11/20/2013   CLINICAL DATA:  Severe mid chest pain with cough, shortness of breath. History of asthma.  EXAM: CHEST  2 VIEW  COMPARISON:  Back 04/05/2013 and 09/16/2011.  FINDINGS: The heart size and mediastinal contours are stable. There are persistent perihilar and basilar pulmonary opacities bilaterally with an asymmetric component on the right. Compared with the study from 4 months ago, there has been partial clearing on the left. There is no pleural effusion. The osseous structures appear unchanged.  IMPRESSION: Persistent or recurrent perihilar and lower lobe airspace opacities bilaterally. Similar findings or demonstrated 4 months ago. Findings could reflect recurrent pneumonia, atypical edema or a more indolent process such as pulmonary hemorrhage. If the etiology for the findings has not been determined,  CT may be helpful for further evaluation.   Electronically Signed   By: Camie Patience M.D.   On: 11/20/2013 08:37   Ct Angio Chest Pe W/cm &/or Wo Cm  11/20/2013   CLINICAL DATA:  Chest pain. Shortness of breath.  Dyspnea.  EXAM: CT ANGIOGRAPHY CHEST WITH CONTRAST  TECHNIQUE: Multidetector CT imaging of the chest was performed using the standard protocol during bolus administration of intravenous contrast. Multiplanar CT image reconstructions including MIPs were obtained to evaluate the vascular anatomy.  CONTRAST:  34m OMNIPAQUE IOHEXOL 350 MG/ML SOLN  COMPARISON:  11/20/2013; 09/14/2011  FINDINGS: No filling defect is identified in the pulmonary arterial tree to suggest pulmonary embolus. No acute aortic dissection. No pathologic thoracic adenopathy.  Bilateral scattered ground-glass opacities are observed, affecting groups secondary pulmonary lobules in a confluent appearance, but with various on affected regions of lung interspersed. This appearance was not present on the prior CT scan from 2012, but similar findings, especially in the lower lobes, or present on 07/22/2013.  Review of the MIP images confirms the above findings.  IMPRESSION: 1. Confluent ground-glass densities in both lungs, potentially progressing over the last 3-4 months based on the imaging record. 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 Entities such as hypersensitivity pneumonitis, chronic eosinophilic pneumonia, and alveolar proteinosis are less likely given the appearance. 2. No filling defect is identified in the pulmonary arterial tree to suggest pulmonary embolus.   Electronically Signed   By: WSherryl BartersM.D.   On: 11/20/2013 11:08    GOren Binet MD  [facility] Pager:[phone] If 7PM-7AM, please contact night-coverage [email or web address] Password [email or web address] 11/23/2013, 1:38 PM   LOS: 2 days
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
## 2013-11-23 NOTE — Progress Notes (Signed)
Utilization review completed.  

## 2013-11-23 NOTE — Op Note (Signed)
Indication: Bilateral unexplained pulmonary infiltrates in the  38 -year-old smoker with suspicion for COP  Written informed consent was obtained from the patient prior to the procedure. The risks of the procedure including coughing, bleeding and a small chance of lung cancer requiring a chest tube were discussed with the patient in great detail and evidenced understanding.  8 mg of Versed and  168mcg of fentanyl were used in divided doses prior to the procedure. She remained wide awake & did not tolerate scope insertion via mouth or via rt nare. At this time she expressed that she did not want to proceed. It was felt unsafe to give her more sedative & procedure was terminated. Would suggest empiric treatment with prednisone & antibiotics & if infiltates do not resolve , may consider biopsy under anesthesia  Kara Mead MD. FCCP. [place] [facility] Pager [phone] If no response call [phone]

## 2013-11-24 ENCOUNTER — Encounter (HOSPITAL_COMMUNITY): Payer: Self-pay | Admitting: Pulmonary Disease

## 2013-11-24 DIAGNOSIS — R072 Precordial pain: Secondary | ICD-10-CM

## 2013-11-24 MED ORDER — PHENYTOIN SODIUM EXTENDED 100 MG PO CAPS: 200.0000 mg | ORAL_CAPSULE | Freq: Two times a day (BID) | ORAL | Status: DC

## 2013-11-24 MED ORDER — ACETAMINOPHEN 325 MG PO TABS: 650.0000 mg | ORAL_TABLET | Freq: Four times a day (QID) | ORAL | Status: DC | PRN

## 2013-11-24 MED ORDER — ALBUTEROL SULFATE HFA 108 (90 BASE) MCG/ACT IN AERS: 2.0000 | INHALATION_SPRAY | RESPIRATORY_TRACT | Status: DC | PRN

## 2013-11-24 MED ORDER — PNEUMOCOCCAL VAC POLYVALENT 25 MCG/0.5ML IJ INJ
0.5000 mL | INJECTION | Freq: Once | INTRAMUSCULAR | Status: AC
  Administered 2013-11-24: 0.5 mL via INTRAMUSCULAR
  Filled 2013-11-24: qty 0.5

## 2013-11-24 MED ORDER — MELOXICAM 15 MG PO TABS: 15.0000 mg | ORAL_TABLET | Freq: Every day | ORAL | Status: DC

## 2013-11-24 MED ORDER — LEVOFLOXACIN 750 MG PO TABS
750.0000 mg | ORAL_TABLET | Freq: Every day | ORAL | Status: DC
  Administered 2013-11-24: 750 mg via ORAL
  Filled 2013-11-24: qty 1

## 2013-11-24 MED ORDER — PREDNISONE 20 MG PO TABS: ORAL_TABLET | ORAL | Status: DC

## 2013-11-24 MED ORDER — ONDANSETRON HCL 4 MG PO TABS: 4.0000 mg | ORAL_TABLET | Freq: Four times a day (QID) | ORAL | Status: DC | PRN

## 2013-11-24 MED ORDER — LEVOFLOXACIN 750 MG PO TABS: 750.0000 mg | ORAL_TABLET | Freq: Every day | ORAL | Status: DC

## 2013-11-24 MED ORDER — OXYCODONE HCL 10 MG PO TABS: 10.0000 mg | ORAL_TABLET | Freq: Four times a day (QID) | ORAL | Status: DC | PRN
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
## 2013-11-24 NOTE — Progress Notes (Signed)
  Echocardiogram 2D Echocardiogram has been performed.  Mauricio Po 11/24/2013, 8:52 AM

## 2013-11-24 NOTE — Progress Notes (Signed)
Pt discharged to home after visit summary reviewed and pt capable of re verbalizing medications and follow up appointments. Pt remains stable. No signs and symptoms of distress. Educated to return to ER in the event of SOB, dizziness, chest pain, or fainting. Maurizio Geno, RN   

## 2013-11-26 LAB — CULTURE, BLOOD (ROUTINE X 2)
CULTURE: NO GROWTH
Special Requests: NORMAL

## 2013-12-05 ENCOUNTER — Inpatient Hospital Stay: Payer: Self-pay

## 2013-12-08 ENCOUNTER — Inpatient Hospital Stay: Payer: Self-pay | Admitting: Adult Health

## 2013-12-13 ENCOUNTER — Encounter (HOSPITAL_BASED_OUTPATIENT_CLINIC_OR_DEPARTMENT_OTHER): Payer: Self-pay | Admitting: Emergency Medicine

## 2013-12-13 ENCOUNTER — Emergency Department (HOSPITAL_BASED_OUTPATIENT_CLINIC_OR_DEPARTMENT_OTHER)
Admission: EM | Admit: 2013-12-13 | Discharge: 2013-12-13 | Disposition: A | Discharge status: Home / Self Care | Payer: No Typology Code available for payment source | Attending: Emergency Medicine | Admitting: Emergency Medicine

## 2013-12-13 ENCOUNTER — Encounter: Payer: Self-pay | Admitting: Adult Health

## 2013-12-13 ENCOUNTER — Ambulatory Visit (INDEPENDENT_AMBULATORY_CARE_PROVIDER_SITE_OTHER): Payer: Self-pay | Admitting: Adult Health

## 2013-12-13 ENCOUNTER — Ambulatory Visit (INDEPENDENT_AMBULATORY_CARE_PROVIDER_SITE_OTHER)
Admission: RE | Admit: 2013-12-13 | Discharge: 2013-12-13 | Disposition: A | Discharge status: Home / Self Care | Payer: No Typology Code available for payment source | Source: Ambulatory Visit | Attending: Adult Health | Admitting: Adult Health

## 2013-12-13 VITALS — BP 124/74 | HR 70 | Temp 98.5°F | Ht 61.0 in | Wt 168.0 lb

## 2013-12-13 DIAGNOSIS — Z791 Long term (current) use of non-steroidal anti-inflammatories (NSAID): Secondary | ICD-10-CM | POA: Insufficient documentation

## 2013-12-13 DIAGNOSIS — J45909 Unspecified asthma, uncomplicated: Secondary | ICD-10-CM | POA: Insufficient documentation

## 2013-12-13 DIAGNOSIS — J849 Interstitial pulmonary disease, unspecified: Secondary | ICD-10-CM

## 2013-12-13 DIAGNOSIS — J189 Pneumonia, unspecified organism: Secondary | ICD-10-CM

## 2013-12-13 DIAGNOSIS — J8410 Pulmonary fibrosis, unspecified: Secondary | ICD-10-CM

## 2013-12-13 DIAGNOSIS — F17200 Nicotine dependence, unspecified, uncomplicated: Secondary | ICD-10-CM | POA: Insufficient documentation

## 2013-12-13 DIAGNOSIS — E119 Type 2 diabetes mellitus without complications: Secondary | ICD-10-CM | POA: Insufficient documentation

## 2013-12-13 DIAGNOSIS — IMO0002 Reserved for concepts with insufficient information to code with codable children: Secondary | ICD-10-CM | POA: Insufficient documentation

## 2013-12-13 DIAGNOSIS — Z87828 Personal history of other (healed) physical injury and trauma: Secondary | ICD-10-CM | POA: Insufficient documentation

## 2013-12-13 DIAGNOSIS — G40909 Epilepsy, unspecified, not intractable, without status epilepticus: Secondary | ICD-10-CM | POA: Insufficient documentation

## 2013-12-13 DIAGNOSIS — Z79899 Other long term (current) drug therapy: Secondary | ICD-10-CM | POA: Insufficient documentation

## 2013-12-13 DIAGNOSIS — K029 Dental caries, unspecified: Secondary | ICD-10-CM | POA: Insufficient documentation

## 2013-12-13 DIAGNOSIS — Z8679 Personal history of other diseases of the circulatory system: Secondary | ICD-10-CM | POA: Insufficient documentation

## 2013-12-13 MED ORDER — PREDNISONE 10 MG PO TABS: ORAL_TABLET | ORAL | Status: DC

## 2013-12-13 MED ORDER — OXYCODONE-ACETAMINOPHEN 5-325 MG PO TABS: 1.0000 | ORAL_TABLET | ORAL | Status: DC | PRN

## 2013-12-13 MED ORDER — CLINDAMYCIN HCL 150 MG PO CAPS: 450.0000 mg | ORAL_CAPSULE | Freq: Four times a day (QID) | ORAL | Status: DC

## 2013-12-13 MED ORDER — IBUPROFEN 800 MG PO TABS: 800.0000 mg | ORAL_TABLET | Freq: Three times a day (TID) | ORAL | Status: DC | PRN

## 2013-12-13 NOTE — ED Provider Notes (Signed)
TIME SEEN: 6:10 PM  CHIEF COMPLAINT: Dental pain  HPI: Patient is a 38 year-old female with a history of migraines, seizures, diabetes, asthma who presents emergency department 2 days of bilateral lower posterior dental pain. She reports that 2 days ago she had a filling fall out of her posterior right lower molar. Since that time she's had pain with eating. No fevers, chills, facial swelling, and trismus or drooling, purulent drainage. She does not have a dentist as she states she does not have insurance.  ROS: See HPI Constitutional: no fever  Eyes: no drainage  ENT: no runny nose   Cardiovascular:  no chest pain  Resp: no SOB  GI: no vomiting GU: no dysuria Integumentary: no rash  Allergy: no hives  Musculoskeletal: no leg swelling  Neurological: no slurred speech ROS otherwise negative  PAST MEDICAL HISTORY/PAST SURGICAL HISTORY:  Past Medical History  Diagnosis Date  . Migraine   . Seizures   . Brain injury   . Diabetes mellitus without complication   . Asthma   . Bronchitis, chronic     MEDICATIONS:  Prior to Admission medications   Medication Sig Start Date End Date Taking? Authorizing Provider  acetaminophen (TYLENOL) 325 MG tablet Take 2 tablets (650 mg total) by mouth every 6 (six) hours as needed for mild pain (or Fever >/= 101). 11/24/13   Shanker Kristeen Mans, MD  albuterol (PROVENTIL HFA;VENTOLIN HFA) 108 (90 BASE) MCG/ACT inhaler Inhale 2 puffs into the lungs every 4 (four) hours as needed for wheezing or shortness of breath. 11/24/13   Shanker Kristeen Mans, MD  meloxicam (MOBIC) 15 MG tablet Take 1 tablet (15 mg total) by mouth daily. 11/24/13   Shanker Kristeen Mans, MD  ondansetron (ZOFRAN) 4 MG tablet Take 1 tablet (4 mg total) by mouth every 6 (six) hours as needed for nausea. 11/24/13   Shanker Kristeen Mans, MD  oxyCODONE 10 MG TABS Take 1 tablet (10 mg total) by mouth every 6 (six) hours as needed for moderate pain. 11/24/13   Shanker Kristeen Mans, MD  phenytoin (DILANTIN) 100 MG  ER capsule Take 2 capsules (200 mg total) by mouth 2 (two) times daily. 11/24/13   Shanker Kristeen Mans, MD  predniSONE (DELTASONE) 10 MG tablet 40mg  daily for 5 days ,  then 30mg  daily for 5 days  then 20mg  daily for 5 days then  10mg  daily for 5 days , then 5mg  daily for 5 days and every other day for 5 days and stop . 12/13/13   Melvenia Needles, NP    ALLERGIES:  Allergies  Allergen Reactions  . Cephalosporins Anaphylaxis  . Penicillins Anaphylaxis  . Erythromycin Hives  . Tramadol Hives  . Vicodin [Hydrocodone-Acetaminophen] Hives    SOCIAL HISTORY:  History  Substance Use Topics  . Smoking status: Current Every Day Smoker -- 0.50 packs/day for 24 years    Types: Cigarettes  . Smokeless tobacco: Never Used  . Alcohol Use: No    FAMILY HISTORY: History reviewed. No pertinent family history.  EXAM: BP 145/88  Pulse 82  Temp(Src) 98.1 F (36.7 C) (Oral)  Resp 16  Wt 168 lb (76.204 kg)  SpO2 100%  LMP 11/29/2013 CONSTITUTIONAL: Alert and oriented and responds appropriately to questions. Well-appearing; well-nourished well-appearing, nontoxic HEAD: Normocephalic EYES: Conjunctivae clear, PERRL ENT: normal nose; no rhinorrhea; moist mucous membranes; pharynx without lesions noted, multiple dental caries, multiple fractured teeth in the posterior lower molars bilaterally, erythema and swelling is minimal around the posterior right  lower molar with no drainage, no trismus or drooling, no submandibular swelling, no tonsillar hypertrophy or exudate, no uvular deviation NECK: Supple, no meningismus, no LAD  CARD: RRR; S1 and S2 appreciated; no murmurs, no clicks, no rubs, no gallops RESP: Normal chest excursion without splinting or tachypnea; breath sounds clear and equal bilaterally; no wheezes, no rhonchi, no rales,  ABD/GI: Normal bowel sounds; non-distended; soft, non-tender, no rebound, no guarding BACK:  The back appears normal and is non-tender to palpation, there is no CVA  tenderness EXT: Normal ROM in all joints; non-tender to palpation; no edema; normal capillary refill; no cyanosis    SKIN: Normal color for age and race; warm NEURO: Moves all extremities equally PSYCH: The patient's mood and manner are appropriate. Grooming and personal hygiene are appropriate.  MEDICAL DECISION MAKING: Patient here with dental pain. Will give small amount of pain medication, penicillin and dental followup. She is well-appearing with no signs of obvious abscess on exam. Given return precautions. Patient verbalizes daily and is comfortable with plan.       [place], DO 12/13/13 1823

## 2013-12-13 NOTE — Discharge Instructions (Signed)
Dental Caries  Dental caries (also called tooth decay) is the most common oral disease. It can occur at any age, but is more common in children and young adults.  HOW DENTAL CARIES DEVELOPS  The process of decay begins when bacteria and foods (particularly sugars and starches) combine in your mouth to produce plaque. Plaque is a substance that sticks to the hard, outer surface of a tooth (enamel). The bacteria in plaque produce acids that attack enamel. These acids may also attack the root surface of a tooth (cementum) if it is exposed. Repeated attacks dissolve these surfaces and create holes in the tooth (cavities). If left untreated, the acids destroy the other layers of the tooth.  RISK FACTORS  Frequent sipping of sugary beverages.   Frequent snacking on sugary and starchy foods, especially those that easily get stuck in the teeth.   Poor oral hygiene.   Dry mouth.   Substance abuse such as methamphetamine abuse.   Broken or poor-fitting dental restorations.   Eating disorders.   Gastroesophageal reflux disease (GERD).   Certain radiation treatments to the head and neck. SYMPTOMS In the early stages of dental caries, symptoms are seldom present. Sometimes white, chalky areas may be seen on the enamel or other tooth layers. In later stages, symptoms may include:  Pits and holes on the enamel.  Toothache after sweet, hot, or cold foods or drinks are consumed.  Pain around the tooth.  Swelling around the tooth. DIAGNOSIS  Most of the time, dental caries is detected during a regular dental checkup. A diagnosis is made after a thorough medical and dental history is taken and the surfaces of your teeth are checked for signs of dental caries. Sometimes special instruments, such as lasers, are used to check for dental caries. Dental X-ray exams may be taken so that areas not visible to the eye (such as between the contact areas of the teeth) can be checked for cavities.    TREATMENT  If dental caries is in its early stages, it may be reversed with a fluoride treatment or an application of a remineralizing agent at the dental office. Thorough brushing and flossing at home is needed to aid these treatments. If it is in its later stages, treatment depends on the location and extent of tooth destruction:   If a small area of the tooth has been destroyed, the destroyed area will be removed and cavities will be filled with a material such as gold, silver amalgam, or composite resin.   If a large area of the tooth has been destroyed, the destroyed area will be removed and a cap (crown) will be fitted over the remaining tooth structure.   If the center part of the tooth (pulp) is affected, a procedure called a root canal will be needed before a filling or crown can be placed.   If most of the tooth has been destroyed, the tooth may need to be pulled (extracted). HOME CARE INSTRUCTIONS You can prevent, stop, or reverse dental caries at home by practicing good oral hygiene. Good oral hygiene includes:  Thoroughly cleaning your teeth at least twice a day with a toothbrush and dental floss.   Using a fluoride toothpaste. A fluoride mouth rinse may also be used if recommended by your dentist or health care provider.   Restricting the amount of sugary and starchy foods and sugary liquids you consume.   Avoiding frequent snacking on these foods and sipping of these liquids.   Keeping regular visits  with a dentist for checkups and cleanings. PREVENTION   Practice good oral hygiene.  Consider a dental sealant. A dental sealant is a coating material that is applied by your dentist to the pits and grooves of teeth. The sealant prevents food from being trapped in them. It may protect the teeth for several years.  Ask about fluoride supplements if you live in a community without fluorinated water or with water that has a low fluoride content. Use fluoride supplements  as directed by your dentist or health care provider.  Allow fluoride varnish applications to teeth if directed by your dentist or health care provider. Document Released: 07/26/2002 Document Revised: 07/06/2013 Document Reviewed: 11/05/2012 [facility] Patient Information 2014 [place].  Dental Care and Dentist Visits Dental care supports good overall health. Regular dental visits can also help you avoid dental pain, bleeding, infection, and other more serious health problems in the future. It is important to keep the mouth healthy because diseases in the teeth, gums, and other oral tissues can spread to other areas of the body. Some problems, such as diabetes, heart disease, and pre-term labor have been associated with poor oral health.  See your dentist every 6 months. If you experience emergency problems such as a toothache or broken tooth, go to the dentist right away. If you see your dentist regularly, you may catch problems early. It is easier to be treated for problems in the early stages.  WHAT TO EXPECT AT A DENTIST VISIT  Your dentist will look for many common oral health problems and recommend proper treatment. At your regular dental visit, you can expect:  Gentle cleaning of the teeth and gums. This includes scraping and polishing. This helps to remove the sticky substance around the teeth and gums (plaque). Plaque forms in the mouth shortly after eating. Over time, plaque hardens on the teeth as tartar. If tartar is not removed regularly, it can cause problems. Cleaning also helps remove stains.  Periodic X-rays. These pictures of the teeth and supporting bone will help your dentist assess the health of your teeth.  Periodic fluoride treatments. Fluoride is a natural mineral shown to help strengthen teeth. Fluoride treatmentinvolves applying a fluoride gel or varnish to the teeth. It is most commonly done in children.  Examination of the mouth, tongue, jaws, teeth, and gums to  look for any oral health problems, such as:  Cavities (dental caries). This is decay on the tooth caused by plaque, sugar, and acid in the mouth. It is best to catch a cavity when it is small.  Inflammation of the gums caused by plaque buildup (gingivitis).  Problems with the mouth or malformed or misaligned teeth.  Oral cancer or other diseases of the soft tissues or jaws. KEEP YOUR TEETH AND GUMS HEALTHY For healthy teeth and gums, follow these general guidelines as well as your dentist's specific advice:  Have your teeth professionally cleaned at the dentist every 6 months.  Brush twice daily with a fluoride toothpaste.  Floss your teeth daily.  Ask your dentist if you need fluoride supplements, treatments, or fluoride toothpaste.  Eat a healthy diet. Reduce foods and drinks with added sugar.  Avoid smoking. TREATMENT FOR ORAL HEALTH PROBLEMS If you have oral health problems, treatment varies depending on the conditions present in your teeth and gums.  Your caregiver will most likely recommend good oral hygiene at each visit.  For cavities, gingivitis, or other oral health disease, your caregiver will perform a procedure to treat the  problem. This is typically done at a separate appointment. Sometimes your caregiver will refer you to another dental specialist for specific tooth problems or for surgery. SEEK IMMEDIATE DENTAL CARE IF:  You have pain, bleeding, or soreness in the gum, tooth, jaw, or mouth area.  A permanent tooth becomes loose or separated from the gum socket.  You experience a blow or injury to the mouth or jaw area. Document Released: 07/16/2011 Document Revised: 01/26/2012 Document Reviewed: 07/16/2011 [facility] Patient Information 2014 [place], [place].    Emergency Department Resource Guide 1) Find a Doctor and Pay Out of Pocket Although you won't have to find out who is covered by your insurance plan, it is a good idea to ask around and get  recommendations. You will then need to call the office and see if the doctor you have chosen will accept you as a new patient and what types of options they offer for patients who are self-pay. Some doctors offer discounts or will set up payment plans for their patients who do not have insurance, but you will need to ask so you aren't surprised when you get to your appointment.  2) Contact Your Local Health Department Not all health departments have doctors that can see patients for sick visits, but many do, so it is worth a call to see if yours does. If you don't know where your local health department is, you can check in your phone book. The CDC also has a tool to help you locate your state's health department, and many state websites also have listings of all of their local health departments.  3) Find a [facility] If your illness is not likely to be very severe or complicated, you may want to try a walk in clinic. These are popping up all over the country in pharmacies, drugstores, and shopping centers. They're usually staffed by nurse practitioners or physician assistants that have been trained to treat common illnesses and complaints. They're usually fairly quick and inexpensive. However, if you have serious medical issues or chronic medical problems, these are probably not your best option.  No Primary Care Doctor: - Call Health Connect at  [phone] - they can help you locate a primary care doctor that  accepts your insurance, provides certain services, etc. - Physician Referral Service- [phone]  Chronic Pain Problems: Organization         Address  Phone   Notes  [facility]  [phone] Patients need to be referred by their primary care doctor.   Medication Assistance: Organization         Address  Phone   Notes  [facility] Medication [facility] [address] [place], [place] [address] [phone] --Must be a resident of  [facility] -- Must have NO insurance coverage whatsoever (no Medicaid/ Medicare, etc.) -- The pt. MUST have a primary care doctor that directs their care regularly and follows them in the community   MedAssist  [phone]   [employer]  [phone]    Agencies that provide inexpensive medical care: Organization         Address  Phone   Notes  [place]  [phone]   [facility]    [phone]   [facility] [place],  [address] [phone]   [place] [address], [place] [phone]   Planned Parenthood    (  [phone]   [facility]    [phone]   [facility]  [address], [place] Phone:  [phone], Fax:  [phone] Hours of Operation:  9 am - 6 pm, M-F.  Also accepts Medicaid/Medicare and self-pay.  [facility] for [place], [address], [place] Phone: [phone], Fax: [phone]. Hours of Operation:  8:30 am - 5:30 pm, M-F.  Also accepts Medicaid and self-pay.  [facility] [place] [address] [place] Phone: [phone]   [place], [place], [place] [phone], Ext. [phone] Mondays & Thursdays: 7-9 AM.  First 15 patients are seen on a first come, first serve basis.    [place] Providers:  Organization         Address  Phone   Notes  [facility] [address], [place] [phone] Also accepts self-pay patients.  [facility] [id number] [place], [place]  [phone]   [place], [address], [place] [phone]   [facility] [address] [place] [phone]   Lucianne Lei [address], [place]   [phone] Only accepts [place] Access  [place] patients after they have their name applied to their card.   Self-Pay (no insurance) in [place]:  Organization         Address  Phone   Notes  Sickle Cell Patients, [place] [facility] [place] [phone]   [facility] Urgent Care [place] [phone]   [facility] [place]  [place], [place], [place] [phone]   [facility]/Dr. Osei-Bonsu  [address], [place] or [address], [place] [phone] Phone number for both [place] and [place] locations is the same.  Urgent Medical and [facility] [address], [place] [phone]   [facility] [address] [place] or [address] [phone]   [facility] [address], [place] [phone], phone; [phone], fax Sees patients 1st and 3rd Saturday of every month.  Must not qualify for public or private insurance (i.e. Medicaid, Medicare, [place] Health Choice, Veterans' Benefits)  Household income should be no more than 200% of the poverty level The clinic cannot treat you if you are pregnant or think you are pregnant  Sexually transmitted diseases are not treated at the clinic.    Dental Care: Organization         Address  Phone  Notes  [facility] Department of [facility] [place] [phone] Accepts children up to age 53 who are enrolled in [place] or [place]; pregnant women with a Medicaid card; and children who have applied for Medicaid or [place] Health Choice, but were declined, whose parents can pay a reduced fee at time of service.  [facility] Department of Orthopaedic Ambulatory Surgical Intervention Services  [address], [place] [phone] Accepts children up to age 65 who are enrolled in [place] or [place]; pregnant women with a Medicaid  card; and children who have applied for Medicaid or [place], but were declined, whose parents can pay a  reduced fee at time of service.  [facility] Adult [facility] Access PRO[facility]  [place] [phone] Patients are seen by appointment only. Walk-ins are not accepted.  will see patients 33 years of age and older. Monday - Tuesday (8am-5pm) Most Wednesdays (8:30-5pm) $30 per visit, cash only  [facility] Adult Dental Access PROGRAM  [address], [facility] [phone] Patients are seen by appointment only. Walk-ins are not accepted. [place] will see patients 66 years of age and older. One Wednesday Evening (Monthly: Volunteer Based).  $30 per visit, cash only  [place]  [phone] for adults; Children under age 56, call Graduate Pediatric Dentistry at [phone]. Children aged 49-14, please call [phone] to request a pediatric application.  Dental services are provided in all areas of dental care including fillings, crowns and bridges, complete and partial dentures, implants, gum treatment, root canals, and extractions. Preventive care is also provided. Treatment is provided to both adults and children. Patients are selected via a lottery and there is often a waiting list.   [facility] [address] [place]  [phone] [email or web address]   [facility] [address] [place], [place] [phone], Ext. [phone] Second and Fourth Thursday of each month, opens at 6:30 AM; Clinic ends at 9 AM.  Patients are seen on a first-come first-served basis, and a limited number are seen during each clinic.   [facility]  [address] Hillard Danker [place], [place] [phone]   Eligibility Requirements You must have lived in [place], [place], or [place] counties for at least the last three months.   You cannot be eligible for state or federal sponsored [employer],  including [employer], [place], or [employer].   You generally cannot be eligible for healthcare insurance through your employer.    How to apply: Eligibility screenings are held every Tuesday and Wednesday afternoon from 1:00 pm until 4:00 pm. You do not need an appointment for the interview!  [facility] [address], [place], [place]   [facility]  Circle Department  [place]  [phone]    Behavioral Health Resources in the Community: Intensive Outpatient Programs Organization         Address  Phone  Notes  [place]. [address] [place], [place] [phone]   [facility] [address], [place], [facility]   ADS: Alcohol & Drug Svcs [address], [address] [place], [place]   [place] [address]  [place], [place] or [phone]   Substance Abuse Resources Organization         Address  Phone  Notes  Alcohol and Drug Services  [phone]   [place]  [phone]   The Center   Chinita Pester  [phone]   Residential & Outpatient Substance Abuse Program  [phone]   Psychological Services Organization         Address  Phone  Notes  [facility] [place]  [place]  [phone]   [place] [address], [place] or [phone]    Mobile Crisis Teams Organization         Address  Phone  Notes  [facility], Mobile Crisis Care Unit  [phone]  [facility]  [address] [place], [place]   [facility] [address], [place] [phone]    Self-Help/Support Groups Organization         Address  Phone             Notes  [facility]ental Health Assoc.  of [place] - variety of support groups  [place] Call for more information  Narcotics Anonymous (NA), [facility] [address], [employer] [place]  2 meetings at this location   Special educational needs teacher         Address  Phone  Notes  [facility] Residential Treatment [place],    [place]  [phone]   [facility]  [address] [place] [id number], [place], [place]   [place], [place] [phone] Admissions: 8am-3pm M-F  Incentives Substance [place] [address],    [place], [place] [phone]   [facility] [address] [place], [place], [place]   [facility] [address]  [place], [place]   [facility] - Intensive Outpatient [address] Kristeen [address], [place], [place]   [facility] ([place] [place].,  [place], [place] [phone] or [phone]   [facility] Services (RTS) [address] [place], [place] Accepts Medicaid  [facility] [place] [address],  [place] [phone] Substance Abuse/Addiction Treatment   [facility] Organization         Address  Phone  Notes  [facility]  [phone]   Domenic Schwab, PhD [address] Arlis Porta [place], [place]   [phone] or [phone]   [place], [place] [phone]   [facility] [address], [place], [place] [phone] Insurance/Medicaid/sponsorship through [facility] and Families [address], [address] [place]                                    [place], [place] [phone] [place] [address], [place] [phone]    Dr. Adele Schilder  [phone]   [facility] Dept. 1) [address], [place] 2)  [place] 3)  [place] [address], [place] [phone]  [phone]   [place] [phone] or [phone] (After Hours)

## 2013-12-13 NOTE — ED Notes (Signed)
Patient drinking iced tea.

## 2013-12-13 NOTE — Patient Instructions (Signed)
Very important to quit smoking.  Taper prednisone 40mg  daily for 5 days ,  then 30mg  daily for 5 days  then 20mg  daily for 5 days then  10mg  daily for 5 days , then 5mg  daily for 5 days and every other day for 5 days and stop .  Follow up Dr. Elsworth Soho  In 6 weeks with chest xray  Please contact office for sooner follow up if symptoms do not improve or worsen or seek emergency care

## 2013-12-13 NOTE — ED Notes (Signed)
Pt c/o dental pain x 2 days.  

## 2013-12-15 NOTE — Progress Notes (Signed)
   Subjective:    Patient ID: Christine Campbell, female    DOB: October 10, 1976, 38 y.o.   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   12/13/13 [facility] follow up  Pt was admitted 1/5 with pleuritic chest pain, dry cough & mild exertional dyspnea. 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  & PCCM consulted for evaluation. Was started on steroid challenge .  She was scheduled for bronchoscopy however was unable to perform due to sedation issues (after versed 22m and 1764m Fentanyl was fully awake) .  Patient was started on levaquin . Autoimmune workup which included ANA and RA factor was negative. ESR was elevated at 75. HIV and ACE level were negative.   CTA chest on admission negative for pulmonary embolism. Cardiac enzymes were cycled and these was negative. EKG also negative for any ischemic changes. 2-D echocardiogram did not show any wall motion abnormalities.  She was discharged on the 60 mg of prednisone for at least 2 weeks  Requested refill of pain meds, narcotic. Advised that will need to come from her PCP .     Review of Systems Constitutional:   No  weight loss, night sweats,  Fevers, chills,  +fatigue, or  lassitude.  HEENT:   No headaches,  Difficulty swallowing,   , or  Sore throat,                No sneezing, itching, ear ache, nasal congestion, post nasal drip,   CV:  No chest pain,  Orthopnea, PND, swelling in lower extremities, anasarca, dizziness, palpitations, syncope.   GI  No heartburn, indigestion, abdominal pain, nausea, vomiting, diarrhea, change in bowel habits, loss of appetite, bloody stools.   Resp:   No chest wall deformity  Skin: no rash or lesions.  GU: no dysuria, change in color of urine, no urgency or frequency.  No flank pain, no hematuria   MS:  No joint pain or swelling.   No decreased range of motion.  No back pain.  Psych:  No change in mood or affect. No depression or anxiety.  No memory loss.         Objective:   Physical Exam GEN: A/Ox3; pleasant , NAD, well nourished   HEENT:  [place]/AT,  EACs-clear, TMs-wnl, NOSE-clear, THROAT-clear, no lesions, no postnasal drip or exudate noted.   NECK:  Supple w/ fair ROM; no JVD; normal carotid impulses w/o bruits; no thyromegaly or nodules palpated; no lymphadenopathy.  RESP  Clear  P & A; w/o, wheezes/ rales/ or rhonchi.no accessory muscle use, no dullness to percussion  CARD:  RRR, no m/r/g  , no peripheral edema, pulses intact, no cyanosis or clubbing.  GI:   Soft & nt; nml bowel sounds; no organomegaly or masses detected.  Musco: Warm bil, no deformities or joint swelling noted.   Neuro: alert, no focal deficits noted.    Skin: Warm, no lesions or rashes  CXR 12/13/13 >Near-total interval resolution of bibasilar airspace opacities.         Assessment & Plan:

## 2013-12-15 NOTE — Assessment & Plan Note (Addendum)
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  cxr today shows  She is clincally improved with some mild residual symptoms of cough/tightness  Decline narcotic rx refills.  Advised to use otc rx and heat.  Will taper steroids slowly over next 6 weeks to off and have her return for repeat cxr  Smoking cessation discussed  Consider spirometry/PFT in future   Plan  Very important to quit smoking.  Taper prednisone 40mg  daily for 5 days ,  then 30mg  daily for 5 days  then 20mg  daily for 5 days then  10mg  daily for 5 days , then 5mg  daily for 5 days and every other day for 5 days and stop .  Follow up Dr. Elsworth Soho  In 6 weeks with chest xray  Please contact office for sooner follow up if symptoms do not improve or worsen or seek emergency care

## 2013-12-30 ENCOUNTER — Telehealth: Payer: Self-pay

## 2013-12-30 NOTE — Telephone Encounter (Signed)
Pt needs to r/s her pending appt with RA. Spoke with pt. Nothing further needed

## 2014-01-24 ENCOUNTER — Ambulatory Visit: Payer: Self-pay | Admitting: Pulmonary Disease

## 2014-01-25 ENCOUNTER — Emergency Department (HOSPITAL_BASED_OUTPATIENT_CLINIC_OR_DEPARTMENT_OTHER)
Admission: EM | Admit: 2014-01-25 | Discharge: 2014-01-25 | Disposition: A | Discharge status: Home / Self Care | Payer: Self-pay | Attending: Emergency Medicine | Admitting: Emergency Medicine

## 2014-01-25 ENCOUNTER — Emergency Department (HOSPITAL_BASED_OUTPATIENT_CLINIC_OR_DEPARTMENT_OTHER): Payer: Self-pay

## 2014-01-25 ENCOUNTER — Encounter (HOSPITAL_BASED_OUTPATIENT_CLINIC_OR_DEPARTMENT_OTHER): Payer: Self-pay | Admitting: Emergency Medicine

## 2014-01-25 DIAGNOSIS — J45909 Unspecified asthma, uncomplicated: Secondary | ICD-10-CM | POA: Insufficient documentation

## 2014-01-25 DIAGNOSIS — M722 Plantar fascial fibromatosis: Secondary | ICD-10-CM | POA: Insufficient documentation

## 2014-01-25 DIAGNOSIS — Y9389 Activity, other specified: Secondary | ICD-10-CM | POA: Insufficient documentation

## 2014-01-25 DIAGNOSIS — Z79899 Other long term (current) drug therapy: Secondary | ICD-10-CM | POA: Insufficient documentation

## 2014-01-25 DIAGNOSIS — E119 Type 2 diabetes mellitus without complications: Secondary | ICD-10-CM | POA: Insufficient documentation

## 2014-01-25 DIAGNOSIS — Z880 Allergy status to penicillin: Secondary | ICD-10-CM | POA: Insufficient documentation

## 2014-01-25 DIAGNOSIS — Z87891 Personal history of nicotine dependence: Secondary | ICD-10-CM | POA: Insufficient documentation

## 2014-01-25 DIAGNOSIS — X500XXA Overexertion from strenuous movement or load, initial encounter: Secondary | ICD-10-CM | POA: Insufficient documentation

## 2014-01-25 DIAGNOSIS — M25579 Pain in unspecified ankle and joints of unspecified foot: Secondary | ICD-10-CM

## 2014-01-25 DIAGNOSIS — Y929 Unspecified place or not applicable: Secondary | ICD-10-CM | POA: Insufficient documentation

## 2014-01-25 DIAGNOSIS — Z8679 Personal history of other diseases of the circulatory system: Secondary | ICD-10-CM | POA: Insufficient documentation

## 2014-01-25 DIAGNOSIS — G40909 Epilepsy, unspecified, not intractable, without status epilepticus: Secondary | ICD-10-CM | POA: Insufficient documentation

## 2014-01-25 DIAGNOSIS — Z791 Long term (current) use of non-steroidal anti-inflammatories (NSAID): Secondary | ICD-10-CM | POA: Insufficient documentation

## 2014-01-25 DIAGNOSIS — S8990XA Unspecified injury of unspecified lower leg, initial encounter: Secondary | ICD-10-CM | POA: Insufficient documentation

## 2014-01-25 DIAGNOSIS — Z87820 Personal history of traumatic brain injury: Secondary | ICD-10-CM | POA: Insufficient documentation

## 2014-01-25 DIAGNOSIS — S99929A Unspecified injury of unspecified foot, initial encounter: Principal | ICD-10-CM

## 2014-01-25 DIAGNOSIS — S99919A Unspecified injury of unspecified ankle, initial encounter: Principal | ICD-10-CM

## 2014-01-25 NOTE — ED Notes (Signed)
Pt stepped in hole and twisted left ankle 3 days ago.  Started hurting today.

## 2014-01-25 NOTE — Discharge Instructions (Signed)

## 2014-01-25 NOTE — ED Provider Notes (Signed)
Medical screening examination/treatment/procedure(s) were performed by non-physician practitioner and as supervising physician I was immediately available for consultation/collaboration.   EKG Interpretation None        [name], MD 01/25/14 2026

## 2014-01-25 NOTE — ED Provider Notes (Signed)
CSN: [id number]     Arrival date & time 01/25/14  1906 History   First MD Initiated Contact with Patient 01/25/14 1911     Chief Complaint  Patient presents with  . Ankle Injury     (Consider location/radiation/quality/duration/timing/severity/associated sxs/prior Treatment) HPI Comments: Pt states that 3 days ago she stepped in a whole and twisted her left ankle. Pt states that she started having ankle and heel pain today. Has tried otc medications without relief. Has has a previous fracture to the area  The history is provided by the patient. No language interpreter was used.    Past Medical History  Diagnosis Date  . Migraine   . Seizures   . Brain injury   . Diabetes mellitus without complication   . Asthma   . Bronchitis, chronic    Past Surgical History  Procedure Laterality Date  . Tubal ligation    . Traumatic brain injury    . Video bronchoscopy Bilateral 11/23/2013    Procedure: VIDEO BRONCHOSCOPY WITH FLUORO;  Surgeon: Rigoberto Noel, MD;  Location: [facility];  Service: Cardiopulmonary;  Laterality: Bilateral;   No family history on file. History  Substance Use Topics  . Smoking status: Former Smoker -- 24 years    Quit date: 01/04/2014  . Smokeless tobacco: Never Used  . Alcohol Use: No   OB History   Grav Para Term Preterm Abortions TAB SAB Ect Mult Living                 Review of Systems  Constitutional: Negative.   Respiratory: Negative.   Cardiovascular: Negative.       Allergies  Cephalosporins; Penicillins; Erythromycin; Tramadol; and Vicodin  Home Medications   Current Outpatient Rx  Name  Route  Sig  Dispense  Refill  . acetaminophen (TYLENOL) 325 MG tablet   Oral   Take 2 tablets (650 mg total) by mouth every 6 (six) hours as needed for mild pain (or Fever >/= 101).         Marland Kitchen albuterol (PROVENTIL HFA;VENTOLIN HFA) 108 (90 BASE) MCG/ACT inhaler   Inhalation   Inhale 2 puffs into the lungs every 4 (four) hours as needed for  wheezing or shortness of breath.   1 Inhaler   2   . clindamycin (CLEOCIN) 150 MG capsule   Oral   Take 3 capsules (450 mg total) by mouth every 6 (six) hours.   90 capsule   0   . ibuprofen (ADVIL,MOTRIN) 800 MG tablet   Oral   Take 1 tablet (800 mg total) by mouth every 8 (eight) hours as needed for mild pain.   30 tablet   0   . meloxicam (MOBIC) 15 MG tablet   Oral   Take 1 tablet (15 mg total) by mouth daily.   30 tablet   0   . ondansetron (ZOFRAN) 4 MG tablet   Oral   Take 1 tablet (4 mg total) by mouth every 6 (six) hours as needed for nausea.   20 tablet   0   . oxyCODONE 10 MG TABS   Oral   Take 1 tablet (10 mg total) by mouth every 6 (six) hours as needed for moderate pain.   30 tablet   0   . oxyCODONE-acetaminophen (PERCOCET/ROXICET) 5-325 MG per tablet   Oral   Take 1 tablet by mouth every 4 (four) hours as needed.   10 tablet   0   . phenytoin (DILANTIN) 100 MG ER  capsule   Oral   Take 2 capsules (200 mg total) by mouth 2 (two) times daily.   60 capsule   0   . predniSONE (DELTASONE) 10 MG tablet      40mg  daily for 5 days ,  then 30mg  daily for 5 days  then 20mg  daily for 5 days then  10mg  daily for 5 days , then 5mg  daily for 5 days and every other day for 5 days and stop .   60 tablet   0    BP 144/95  Pulse 114  Temp(Src) 98.5 F (36.9 C) (Oral)  Resp 18  Ht 5\' 1"  (1.549 m)  Wt 153 lb (69.4 kg)  BMI 28.92 kg/m2  SpO2 99%  LMP 01/11/2014 Physical Exam  Nursing note and vitals reviewed. Constitutional: She is oriented to person, place, and time. She appears well-developed and well-nourished.  Cardiovascular: Normal rate and regular rhythm.   Pulmonary/Chest: Effort normal.  Musculoskeletal:  Mild tenderness to left lateral ankle. Tender at heal  Neurological: She is alert and oriented to person, place, and time.  Skin: Skin is warm and dry.  Psychiatric: She has a normal mood and affect.    ED Course  Procedures (including  critical care time) Labs Review Labs Reviewed - No data to display Imaging Review Dg Ankle Complete Left  01/25/2014   CLINICAL DATA Injury 3 days ago with pain.  EXAM LEFT ANKLE COMPLETE - 3+ VIEW  COMPARISON None.  FINDINGS There is no evidence of fracture, dislocation, or joint effusion. There is no evidence of arthropathy or other focal bone abnormality. Soft tissues are unremarkable.  IMPRESSION Negative.  SIGNATURE  Electronically Signed   By: Misty Stanley M.D.   On: 01/25/2014 19:43     EKG Interpretation None      MDM   Final diagnoses:  Ankle pain  Plantar fasciitis    Discussed exercises for plantar fascitis with pt. No bony abnormality noted    Glendell Docker, NP 01/25/14 2012

## 2014-01-30 ENCOUNTER — Ambulatory Visit: Payer: Self-pay | Admitting: Pulmonary Disease

## 2014-02-08 ENCOUNTER — Ambulatory Visit: Payer: Self-pay | Admitting: Pulmonary Disease

## 2014-03-19 ENCOUNTER — Encounter (HOSPITAL_BASED_OUTPATIENT_CLINIC_OR_DEPARTMENT_OTHER): Payer: Self-pay | Admitting: Emergency Medicine

## 2014-03-19 ENCOUNTER — Emergency Department (HOSPITAL_BASED_OUTPATIENT_CLINIC_OR_DEPARTMENT_OTHER)
Admission: EM | Admit: 2014-03-19 | Discharge: 2014-03-19 | Disposition: A | Discharge status: Home / Self Care | Payer: No Typology Code available for payment source | Attending: Emergency Medicine | Admitting: Emergency Medicine

## 2014-03-19 DIAGNOSIS — Z8679 Personal history of other diseases of the circulatory system: Secondary | ICD-10-CM | POA: Insufficient documentation

## 2014-03-19 DIAGNOSIS — Z79899 Other long term (current) drug therapy: Secondary | ICD-10-CM | POA: Insufficient documentation

## 2014-03-19 DIAGNOSIS — K0889 Other specified disorders of teeth and supporting structures: Secondary | ICD-10-CM

## 2014-03-19 DIAGNOSIS — E119 Type 2 diabetes mellitus without complications: Secondary | ICD-10-CM | POA: Insufficient documentation

## 2014-03-19 DIAGNOSIS — Z880 Allergy status to penicillin: Secondary | ICD-10-CM | POA: Insufficient documentation

## 2014-03-19 DIAGNOSIS — J45909 Unspecified asthma, uncomplicated: Secondary | ICD-10-CM | POA: Insufficient documentation

## 2014-03-19 DIAGNOSIS — Z87891 Personal history of nicotine dependence: Secondary | ICD-10-CM | POA: Insufficient documentation

## 2014-03-19 DIAGNOSIS — K029 Dental caries, unspecified: Secondary | ICD-10-CM | POA: Insufficient documentation

## 2014-03-19 DIAGNOSIS — Z791 Long term (current) use of non-steroidal anti-inflammatories (NSAID): Secondary | ICD-10-CM | POA: Insufficient documentation

## 2014-03-19 DIAGNOSIS — IMO0002 Reserved for concepts with insufficient information to code with codable children: Secondary | ICD-10-CM | POA: Insufficient documentation

## 2014-03-19 DIAGNOSIS — K089 Disorder of teeth and supporting structures, unspecified: Secondary | ICD-10-CM | POA: Insufficient documentation

## 2014-03-19 DIAGNOSIS — Z87820 Personal history of traumatic brain injury: Secondary | ICD-10-CM | POA: Insufficient documentation

## 2014-03-19 DIAGNOSIS — G40909 Epilepsy, unspecified, not intractable, without status epilepticus: Secondary | ICD-10-CM | POA: Insufficient documentation

## 2014-03-19 MED ORDER — IBUPROFEN 800 MG PO TABS: 800.0000 mg | ORAL_TABLET | Freq: Three times a day (TID) | ORAL | Status: DC

## 2014-03-19 MED ORDER — KETOROLAC TROMETHAMINE 60 MG/2ML IM SOLN
60.0000 mg | Freq: Once | INTRAMUSCULAR | Status: AC
  Administered 2014-03-19: 60 mg via INTRAMUSCULAR
  Filled 2014-03-19: qty 2

## 2014-03-19 MED ORDER — OXYCODONE-ACETAMINOPHEN 5-325 MG PO TABS: 1.0000 | ORAL_TABLET | Freq: Four times a day (QID) | ORAL | Status: DC | PRN

## 2014-03-19 MED ORDER — CLINDAMYCIN HCL 150 MG PO CAPS: 150.0000 mg | ORAL_CAPSULE | Freq: Four times a day (QID) | ORAL | Status: DC

## 2014-03-19 NOTE — ED Notes (Signed)
Patient c/o L side dental pain since last night

## 2014-03-19 NOTE — ED Provider Notes (Signed)
CSN: [id number]     Arrival date & time 03/19/14  1028 History   First MD Initiated Contact with Patient 03/19/14 1041     Chief Complaint  Patient presents with  . Dental Pain     (Consider location/radiation/quality/duration/timing/severity/associated sxs/prior Treatment) HPI  Patient with a history of migraine and seizures who presents with dental pain. She reports worsening dental pain since last night. Currently pain is 8/10. She states the ibuprofen is not helping. She reports left upper pre-molar pain. She denies any sore throat or fevers. She does not currently have a dentist.   Past Medical History  Diagnosis Date  . Migraine   . Seizures   . Brain injury   . Diabetes mellitus without complication   . Asthma   . Bronchitis, chronic    Past Surgical History  Procedure Laterality Date  . Tubal ligation    . Traumatic brain injury    . Video bronchoscopy Bilateral 11/23/2013    Procedure: VIDEO BRONCHOSCOPY WITH FLUORO;  Surgeon: Rigoberto Noel, MD;  Location: [facility];  Service: Cardiopulmonary;  Laterality: Bilateral;   No family history on file. History  Substance Use Topics  . Smoking status: Former Smoker -- 24 years    Quit date: 01/04/2014  . Smokeless tobacco: Never Used  . Alcohol Use: No   OB History   Grav Para Term Preterm Abortions TAB SAB Ect Mult Living                 Review of Systems  Constitutional: Negative for fever.  HENT: Positive for dental problem.   All other systems reviewed and are negative.     Allergies  Cephalosporins; Penicillins; Erythromycin; Tramadol; and Vicodin  Home Medications   Prior to Admission medications   Medication Sig Start Date End Date Taking? Authorizing Provider  acetaminophen (TYLENOL) 325 MG tablet Take 2 tablets (650 mg total) by mouth every 6 (six) hours as needed for mild pain (or Fever >/= 101). 11/24/13   Shanker Kristeen Mans, MD  albuterol (PROVENTIL HFA;VENTOLIN HFA) 108 (90 BASE) MCG/ACT inhaler  Inhale 2 puffs into the lungs every 4 (four) hours as needed for wheezing or shortness of breath. 11/24/13   Shanker Kristeen Mans, MD  clindamycin (CLEOCIN) 150 MG capsule Take 3 capsules (450 mg total) by mouth every 6 (six) hours. 12/13/13   Kristen N Ward, DO  clindamycin (CLEOCIN) 150 MG capsule Take 1 capsule (150 mg total) by mouth every 6 (six) hours. 03/19/14   Merryl Hacker, MD  ibuprofen (ADVIL,MOTRIN) 800 MG tablet Take 1 tablet (800 mg total) by mouth every 8 (eight) hours as needed for mild pain. 12/13/13   Kristen N Ward, DO  ibuprofen (ADVIL,MOTRIN) 800 MG tablet Take 1 tablet (800 mg total) by mouth 3 (three) times daily. 03/19/14   Merryl Hacker, MD  meloxicam (MOBIC) 15 MG tablet Take 1 tablet (15 mg total) by mouth daily. 11/24/13   Shanker Kristeen Mans, MD  ondansetron (ZOFRAN) 4 MG tablet Take 1 tablet (4 mg total) by mouth every 6 (six) hours as needed for nausea. 11/24/13   Shanker Kristeen Mans, MD  oxyCODONE 10 MG TABS Take 1 tablet (10 mg total) by mouth every 6 (six) hours as needed for moderate pain. 11/24/13   Shanker Kristeen Mans, MD  oxyCODONE-acetaminophen (PERCOCET/ROXICET) 5-325 MG per tablet Take 1 tablet by mouth every 4 (four) hours as needed. 12/13/13   Kristen N Ward, DO  oxyCODONE-acetaminophen (PERCOCET/ROXICET) 5-325 MG per  tablet Take 1 tablet by mouth every 6 (six) hours as needed for severe pain. 03/19/14   Merryl Hacker, MD  phenytoin (DILANTIN) 100 MG ER capsule Take 2 capsules (200 mg total) by mouth 2 (two) times daily. 11/24/13   Shanker Kristeen Mans, MD  predniSONE (DELTASONE) 10 MG tablet 40mg  daily for 5 days ,  then 30mg  daily for 5 days  then 20mg  daily for 5 days then  10mg  daily for 5 days , then 5mg  daily for 5 days and every other day for 5 days and stop . 12/13/13   Tammy S Parrett, NP   BP 136/95  Pulse 110  Temp(Src) 98.3 F (36.8 C) (Oral)  Resp 16  Ht 5\' 1"  (1.549 m)  Wt 145 lb (65.772 kg)  BMI 27.41 kg/m2  SpO2 98% Physical Exam  Nursing note and  vitals reviewed. Constitutional: She is oriented to person, place, and time. She appears well-developed and well-nourished. No distress.  HENT:  Head: Normocephalic and atraumatic.  Mouth/Throat: Oropharynx is clear and moist.  Poor dentition, multiple fractured teeth and dental caries, no swelling or evidence of abscess noted in the left upper premolar region, no trismus  Neck: Neck supple.  Cardiovascular: Normal rate and regular rhythm.   Pulmonary/Chest: Effort normal. No respiratory distress.  Lymphadenopathy:    She has no cervical adenopathy.  Neurological: She is alert and oriented to person, place, and time.  Skin: Skin is warm and dry.  Psychiatric: She has a normal mood and affect.    ED Course  Procedures (including critical care time) Labs Review Labs Reviewed - No data to display  Imaging Review No results found.   EKG Interpretation None      MDM   Final diagnoses:  Pain, dental    Patient presents with dental pain. No acute abscess or infection noted. Discuss with patient treatment with ibuprofen and antibiotics. She'll be given a resource guide for dental referral. She will be given a prescription for 6 Percocet. Patient stated understanding.  After history, exam, and medical workup I feel the patient has been appropriately medically screened and is safe for discharge home. Pertinent diagnoses were discussed with the patient. Patient was given return precautions.     Merryl Hacker, MD 03/19/14 1113

## 2014-03-19 NOTE — Discharge Instructions (Signed)
Dental Pain Toothache is pain in or around a tooth. It may get worse with chewing or with cold or heat.  HOME CARE  Your dentist may use a numbing medicine during treatment. If so, you may need to avoid eating until the medicine wears off. Ask your dentist about this.  Only take medicine as told by your dentist or doctor.  Avoid chewing food near the painful tooth until after all treatment is done. Ask your dentist about this. GET HELP RIGHT AWAY IF:   The problem gets worse or new problems appear.  You have a fever.  There is redness and puffiness (swelling) of the face, jaw, or neck.  You cannot open your mouth.  There is pain in the jaw.  There is very bad pain that is not helped by medicine. MAKE SURE YOU:   Understand these instructions.  Will watch your condition.  Will get help right away if you are not doing well or get worse. Document Released: 04/21/2008 Document Revised: 01/26/2012 Document Reviewed: 04/21/2008 [facility] Patient Information 2014 [place], [place].   Emergency Department Resource Guide 1) Find a Doctor and Pay Out of Pocket Although you won't have to find out who is covered by your insurance plan, it is a good idea to ask around and get recommendations. You will then need to call the office and see if the doctor you have chosen will accept you as a new patient and what types of options they offer for patients who are self-pay. Some doctors offer discounts or will set up payment plans for their patients who do not have insurance, but you will need to ask so you aren't surprised when you get to your appointment.  2) Contact Your Local Health Department Not all health departments have doctors that can see patients for sick visits, but many do, so it is worth a call to see if yours does. If you don't know where your local health department is, you can check in your phone book. The CDC also has a tool to help you locate your state's health department, and many  state websites also have listings of all of their local health departments.  3) Find a [facility] If your illness is not likely to be very severe or complicated, you may want to try a walk in clinic. These are popping up all over the country in pharmacies, drugstores, and shopping centers. They're usually staffed by nurse practitioners or physician assistants that have been trained to treat common illnesses and complaints. They're usually fairly quick and inexpensive. However, if you have serious medical issues or chronic medical problems, these are probably not your best option.  No Primary Care Doctor: - Call Health Connect at  [phone] - they can help you locate a primary care doctor that  accepts your insurance, provides certain services, etc. - Physician Referral Service- [phone]  Chronic Pain Problems: Organization         Address  Phone   Notes  [facility]  [phone] Patients need to be referred by their primary care doctor.   Medication Assistance: Organization         Address  Phone   Notes  [facility] Med[facility] [address] [place],  [address] [phone] --Must be a resident of [facility] -- Must have NO insurance coverage whatsoever (no Medicaid/ Medicare, etc.) -- The pt. MUST have a primary care doctor that directs their care regularly and follows  them in the community   Med[facility]ist  [phone]   [employer]  [phone]    Agencies that provide inexpensive medical care: Organization         Address  Phone   Notes  [place]  [phone]   [facility]    [phone]   [facility] [place], [place] [address] [phone]   [place] [address], [place] [phone]   Planned Parenthood    [phone]   [facility]    [phone]   [place] and  [address], [place] Phone:  [phone], Fax:  [phone] Hours of Operation:  9 am - 6 pm, M-F.  Also accepts Medicaid/Medicare and self-pay.  [facility] for [address] [place], [address], [place] Phone: [phone], Fax: [phone]. Hours of Operation:  8:30 am - 5:30 pm, M-F.  Also accepts Medicaid and self-pay.  [facility] [address] [place] Phone: [phone]   [place], [place], [place] [phone], Ext. [phone] Mondays & Thursdays: 7-9 AM.  First 15 patients are seen on a first come, first serve basis.    [place] Providers:  Organization         Address  Phone   Notes  [facility] [address], [place] [phone] Also accepts self-pay patients.  [facility] [address], [place]  [phone]   [place], [address], [place] [phone]   [facility] [address], [place] [phone]   Lucianne Lei [address], [place]   [phone] Only accepts [place] Access [place] patients after they have their name applied to their card.   Self-Pay (no insurance) in [facility]:  Organization         Address  Phone   Notes  Sickle Cell Patients, [facility] [place] [phone]   [facility] [place] [phone]   [facility] [place]  [place], [place], [place] [phone]   [facility]/Dr. Osei-Bonsu  [address], [place] or [address], [place] [phone] Phone number for both Institute and [place] locations is the same.  Urgent Medical and [facility] [address], [place] [phone]   [facility] [address] [place] or [address] [phone]   [facility] [address], [place] [phone], phone; [phone], fax Sees patients 1st and 3rd Saturday of every month.  Must not qualify for public or private insurance (i.e. Medicaid, Medicare, [place] Health Choice, Veterans' Benefits)  Household income should be no more than 200% of the poverty level The clinic cannot treat you if you are pregnant or think you are pregnant  Sexually transmitted diseases are not treated at the clinic.    Dental Care: Organization         Address  Phone  Notes  [facility] Department of [facility] [address] [place], [place] [phone] Accepts children up to age 15 who are enrolled  in Medicaid or [place] Health Choice; pregnant women with a Medicaid card; and children who have applied for Medicaid or [place] Health Choice, but were declined, whose parents can pay a reduced fee at time of service.  [facility] Department of [facility]  [address], [place] [phone] Accepts children up to age 6 who are enrolled in [place] or [place] Health Choice; pregnant women with a Medicaid card; and children who have applied for Medicaid or [place] Health Choice, but were declined, whose parents can pay a reduced fee at time of service.  [facility]  [address] [place], [place] [phone] Patients are seen by appointment only. Walk-ins are not accepted. [facility] will see patients 9 years of age and older. Monday - Tuesday (8am-5pm) Most Wednesdays (8:30-5pm) $30 per visit, cash only  [facility] Adult Dental Access PROGRAM  [address], [facility] [phone] Patients are seen by appointment only. Walk-ins are not accepted. [facility] will see patients 78 years of age and older. One Wednesday Evening (Monthly: Volunteer Based).  $30 per visit, cash only  [employer]  [phone] for adults; Children under age 15, call Graduate Pediatric Dentistry at [phone]. Children aged 17-14, please call [phone] to request a pediatric application.  Dental services are provided in all areas of dental care including fillings, crowns and bridges, complete and partial dentures, implants, gum treatment, root canals, and extractions. Preventive care is also provided. Treatment is provided to both adults and children. Patients are selected via a lottery and there is often a waiting list.   [facility] [address], [place]  [phone] [email or web address]   [facility] [address] [place], [place] [phone], Ext. [phone] Second and Fourth Thursday of each month, opens at 6:30 AM; Clinic ends at 9 AM.  Patients are seen on a first-come first-served basis, and a limited number are seen during each clinic.   [facility]  [address] Ether [place]ins [place], [place] [phone]   Eligibility Requirements You must have lived in [place], [place], or [place] counties for at least the last three months.   You cannot be eligible for state or federal sponsored [place], including [employer], [place], or [employer].   You generally cannot be eligible for healthcare insurance through your employer.    How to apply: Eligibility screenings are held every Tuesday and Wednesday afternoon from 1:00 pm until 4:00 pm. You do not need an appointment for the interview!  [facility] [address] [place], [place] [phone]   [facility] Health Department  [phone]   [facility] Health Department  [phone]   [facility] Health Department  [phone]    Behavioral Health Resources in the Community: Intensive Outpatient Programs Organization         Address  Phone  Notes  [facility] Services [address] [place], [place]  [phone]   [facility] Outpatient [address] [place], [place] [phone]   [facility]: [facility] [address] [place], [place]  [phone]   [facility] [address]  [place], [place] [phone] or [phone]   Substance Abuse Resources Organization         Address  Phone  Notes  Alcohol and Drug Services  [phone]   [facility]  [phone]  [facility] [place]   Chinita Pester  [phone]   Residential & Outpatient Substance Abuse Program  [phone]   Psychological Services Organization         Address  Phone  Notes  [facility] [place]  [place]  [phone]   [place] [address], [place] or [phone]    Mobile Crisis Teams Organization         Address  Phone  Notes  [facility], Mobile Crisis Care Unit  [phone]   [facility] Psych[facility]  [address] [place], [place]   [place] Levels [address] [place] [phone]    Self-Help/Support Groups Organization         Address  Phone             Notes  [place]. of [place] - variety of support groups  [place] Call for more information  Narcotics Anonymous (NA), [facility] [address], [employer] [place]  2 meetings at this location   Special educational needs teacher         Address  Phone  Notes  ASAP Residential Treatment [place],    [place]  [phone]   [facility]  [address] [place] [phone], [place], [place]   [place], [place] [phone] Admissions: 8am-3pm M-F  Incentives Substance [place] [address],    [place], [place] [phone]   [facility] [address] [place], [place], [place]   [facility] [address],    [place], [place]   [facility] - Intensive Outpatient [address] Kristeen Mans [address], [place], [place]   [facility] [address]  [place], [place] [phone] or [phone]   [facility] Services (RTS) [address], [place], [place] Accepts Medicaid  [facility] [place] [address]  [place] [phone] Substance Abuse/Addiction Treatment   [facility] Organization         Address  Phone  Notes  [facility]  [phone]   Domenic Schwab, PhD [address] Arlis [place], [place]   [phone] or [phone]   [place], [place] [phone]   [facility] [address], [place], [place] [phone] Insurance/Medicaid/sponsorship through [facility] and Families [address], [address] [place]                                    [place], [place] [phone] [place] [address], [place] [phone]    Dr. Adele Schilder  [phone]   [facility] [place] Dept. 1) [address] [place] 2) [place] 3)  [place] [address], [place] [phone]  [phone]   [place] [phone] or [phone] (After Hours)

## 2014-03-21 ENCOUNTER — Ambulatory Visit: Payer: Self-pay | Admitting: Pulmonary Disease

## 2014-05-01 ENCOUNTER — Ambulatory Visit: Payer: Self-pay | Admitting: Pulmonary Disease

## 2014-05-15 ENCOUNTER — Ambulatory Visit (INDEPENDENT_AMBULATORY_CARE_PROVIDER_SITE_OTHER): Payer: Self-pay | Admitting: Pulmonary Disease

## 2014-05-15 ENCOUNTER — Encounter: Payer: Self-pay | Admitting: Pulmonary Disease

## 2014-05-15 ENCOUNTER — Ambulatory Visit (INDEPENDENT_AMBULATORY_CARE_PROVIDER_SITE_OTHER)
Admission: RE | Admit: 2014-05-15 | Discharge: 2014-05-15 | Disposition: A | Discharge status: Home / Self Care | Payer: Self-pay | Source: Ambulatory Visit | Attending: Pulmonary Disease | Admitting: Pulmonary Disease

## 2014-05-15 VITALS — BP 134/92 | HR 75 | Temp 98.3°F | Ht 61.0 in | Wt 160.6 lb

## 2014-05-15 DIAGNOSIS — J849 Interstitial pulmonary disease, unspecified: Secondary | ICD-10-CM

## 2014-05-15 DIAGNOSIS — J8410 Pulmonary fibrosis, unspecified: Secondary | ICD-10-CM

## 2014-05-15 DIAGNOSIS — J449 Chronic obstructive pulmonary disease, unspecified: Secondary | ICD-10-CM

## 2014-05-15 MED ORDER — TIOTROPIUM BROMIDE MONOHYDRATE 2.5 MCG/ACT IN AERS: 2.0000 | INHALATION_SPRAY | Freq: Every day | RESPIRATORY_TRACT | Status: DC

## 2014-05-15 NOTE — Patient Instructions (Addendum)
CXR is clear Oxygen level did not drop Breathing test shows airway obstruction  You have to QUIT smoking !! Trial of spiriva - call me if this works Albuterol 1 puffs q 6h as needed

## 2014-05-15 NOTE — Progress Notes (Signed)
   Subjective:    Patient ID: Christine [place], female    DOB: Aug 31, 1976, 38 y.o.   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   Significant tests/ events  11/21/13 -admitted  with pleuritic chest pain, dry cough & mild exertional dyspnea. 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  ANA,RA factor was negative. ESR was elevated at 75. HIV and ACE level were negative.  2-D echocardiogram did not show any wall motion abnormalities.   05/15/2014  26m FU She tapered her prednisone over a month in January Now C/o difficulty taking deep breath Occasional dry cough Continues to smoke 4-5 cigs/d Did not destan with walking Spirometry >> severe airway obstrucn , poor effort, FEV1 46%- 1.28, ratio 54, FVC 73% CXR -clear  Review of Systems neg for any significant sore throat, dysphagia, itching, sneezing, nasal congestion or excess/ purulent secretions, fever, chills, sweats, unintended wt loss, pleuritic or exertional cp, hempoptysis, orthopnea pnd or change in chronic leg swelling. Also denies presyncope, palpitations, heartburn, abdominal pain, nausea, vomiting, diarrhea or change in bowel or urinary habits, dysuria,hematuria, rash, arthralgias, visual complaints, headache, numbness weakness or ataxia.     Objective:   Physical Exam  Gen. Pleasant, well-nourished, in no distress ENT - no lesions, no post nasal drip Neck: No JVD, no thyromegaly, no carotid bruits Lungs: no use of accessory muscles, no dullness to percussion, clear without rales or rhonchi  Cardiovascular: Rhythm regular, heart sounds  normal, no murmurs or gallops, no peripheral  edema Musculoskeletal: No deformities, no cyanosis or clubbing        Assessment & Plan:

## 2014-05-18 DIAGNOSIS — J449 Chronic obstructive pulmonary disease, unspecified: Secondary | ICD-10-CM | POA: Insufficient documentation

## 2014-05-18 NOTE — Assessment & Plan Note (Signed)
Trial of spiriva - call me if this works Albuterol 1 puffs q 6h as needed Consider pulmonary rehabilitation in the future

## 2014-05-18 NOTE — Assessment & Plan Note (Signed)
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 This remains the primary intervention at this time, would consider open lung biopsy only if infiltrates return after smoking cessation

## 2014-05-26 ENCOUNTER — Telehealth: Payer: Self-pay | Admitting: Pulmonary Disease

## 2014-05-26 MED ORDER — TIOTROPIUM BROMIDE MONOHYDRATE 2.5 MCG/ACT IN AERS: 2.0000 | INHALATION_SPRAY | Freq: Every day | RESPIRATORY_TRACT | Status: DC

## 2014-05-26 NOTE — Telephone Encounter (Signed)
Pt is aware of below. Nothing further needed 

## 2014-05-26 NOTE — Telephone Encounter (Signed)
lmtcb x1 Samples left for pick up along with PA forms

## 2014-06-03 ENCOUNTER — Emergency Department (HOSPITAL_BASED_OUTPATIENT_CLINIC_OR_DEPARTMENT_OTHER)
Admission: EM | Admit: 2014-06-03 | Discharge: 2014-06-03 | Disposition: A | Discharge status: Home / Self Care | Payer: No Typology Code available for payment source | Attending: Emergency Medicine | Admitting: Emergency Medicine

## 2014-06-03 ENCOUNTER — Encounter (HOSPITAL_BASED_OUTPATIENT_CLINIC_OR_DEPARTMENT_OTHER): Payer: Self-pay | Admitting: Emergency Medicine

## 2014-06-03 DIAGNOSIS — Z87891 Personal history of nicotine dependence: Secondary | ICD-10-CM | POA: Insufficient documentation

## 2014-06-03 DIAGNOSIS — Z880 Allergy status to penicillin: Secondary | ICD-10-CM | POA: Insufficient documentation

## 2014-06-03 DIAGNOSIS — E119 Type 2 diabetes mellitus without complications: Secondary | ICD-10-CM | POA: Insufficient documentation

## 2014-06-03 DIAGNOSIS — G40909 Epilepsy, unspecified, not intractable, without status epilepticus: Secondary | ICD-10-CM | POA: Insufficient documentation

## 2014-06-03 DIAGNOSIS — J45909 Unspecified asthma, uncomplicated: Secondary | ICD-10-CM | POA: Insufficient documentation

## 2014-06-03 DIAGNOSIS — K029 Dental caries, unspecified: Secondary | ICD-10-CM | POA: Insufficient documentation

## 2014-06-03 DIAGNOSIS — Z79899 Other long term (current) drug therapy: Secondary | ICD-10-CM | POA: Insufficient documentation

## 2014-06-03 DIAGNOSIS — Z87820 Personal history of traumatic brain injury: Secondary | ICD-10-CM | POA: Insufficient documentation

## 2014-06-03 DIAGNOSIS — Z8679 Personal history of other diseases of the circulatory system: Secondary | ICD-10-CM | POA: Insufficient documentation

## 2014-06-03 MED ORDER — OXYCODONE-ACETAMINOPHEN 5-325 MG PO TABS
1.0000 | ORAL_TABLET | Freq: Once | ORAL | Status: AC
  Administered 2014-06-03: 1 via ORAL
  Filled 2014-06-03: qty 1

## 2014-06-03 MED ORDER — OXYCODONE-ACETAMINOPHEN 5-325 MG PO TABS: 1.0000 | ORAL_TABLET | ORAL | Status: DC | PRN

## 2014-06-03 NOTE — ED Notes (Signed)
Pt reports right lower jaw pain molar area

## 2014-06-03 NOTE — Discharge Instructions (Signed)
Dental Caries  Dental caries (also called tooth decay) is the most common oral disease. It can occur at any age, but is more common in children and young adults.  HOW DENTAL CARIES DEVELOPS  The process of decay begins when bacteria and foods (particularly sugars and starches) combine in your mouth to produce plaque. Plaque is a substance that sticks to the hard, outer surface of a tooth (enamel). The bacteria in plaque produce acids that attack enamel. These acids may also attack the root surface of a tooth (cementum) if it is exposed. Repeated attacks dissolve these surfaces and create holes in the tooth (cavities). If left untreated, the acids destroy the other layers of the tooth.  RISK FACTORS  Frequent sipping of sugary beverages.   Frequent snacking on sugary and starchy foods, especially those that easily get stuck in the teeth.   Poor oral hygiene.   Dry mouth.   Substance abuse such as methamphetamine abuse.   Broken or poor-fitting dental restorations.   Eating disorders.   Gastroesophageal reflux disease (GERD).   Certain radiation treatments to the head and neck. SYMPTOMS In the early stages of dental caries, symptoms are seldom present. Sometimes white, chalky areas may be seen on the enamel or other tooth layers. In later stages, symptoms may include:  Pits and holes on the enamel.  Toothache after sweet, hot, or cold foods or drinks are consumed.  Pain around the tooth.  Swelling around the tooth. DIAGNOSIS  Most of the time, dental caries is detected during a regular dental checkup. A diagnosis is made after a thorough medical and dental history is taken and the surfaces of your teeth are checked for signs of dental caries. Sometimes special instruments, such as lasers, are used to check for dental caries. Dental X-ray exams may be taken so that areas not visible to the eye (such as between the contact areas of the teeth) can be checked for cavities.    TREATMENT  If dental caries is in its early stages, it may be reversed with a fluoride treatment or an application of a remineralizing agent at the dental office. Thorough brushing and flossing at home is needed to aid these treatments. If it is in its later stages, treatment depends on the location and extent of tooth destruction:   If a small area of the tooth has been destroyed, the destroyed area will be removed and cavities will be filled with a material such as gold, silver amalgam, or composite resin.   If a large area of the tooth has been destroyed, the destroyed area will be removed and a cap (crown) will be fitted over the remaining tooth structure.   If the center part of the tooth (pulp) is affected, a procedure called a root canal will be needed before a filling or crown can be placed.   If most of the tooth has been destroyed, the tooth may need to be pulled (extracted). HOME CARE INSTRUCTIONS You can prevent, stop, or reverse dental caries at home by practicing good oral hygiene. Good oral hygiene includes:  Thoroughly cleaning your teeth at least twice a day with a toothbrush and dental floss.   Using a fluoride toothpaste. A fluoride mouth rinse may also be used if recommended by your dentist or health care provider.   Restricting the amount of sugary and starchy foods and sugary liquids you consume.   Avoiding frequent snacking on these foods and sipping of these liquids.   Keeping regular visits  with a dentist for checkups and cleanings. PREVENTION   Practice good oral hygiene.  Consider a dental sealant. A dental sealant is a coating material that is applied by your dentist to the pits and grooves of teeth. The sealant prevents food from being trapped in them. It may protect the teeth for several years.  Ask about fluoride supplements if you live in a community without fluorinated water or with water that has a low fluoride content. Use fluoride supplements  as directed by your dentist or health care provider.  Allow fluoride varnish applications to teeth if directed by your dentist or health care provider. Document Released: 07/26/2002 Document Revised: 07/06/2013 Document Reviewed: 11/05/2012 [facility] Patient Information 2015 [place], [place]. This information is not intended to replace advice given to you by your health care provider. Make sure you discuss any questions you have with your health care provider.  Emergency Department Resource Guide 1) Find a Doctor and Pay Out of Pocket Although you won't have to find out who is covered by your insurance plan, it is a good idea to ask around and get recommendations. You will then need to call the office and see if the doctor you have chosen will accept you as a new patient and what types of options they offer for patients who are self-pay. Some doctors offer discounts or will set up payment plans for their patients who do not have insurance, but you will need to ask so you aren't surprised when you get to your appointment.  2) Contact Your Local Health Department Not all health departments have doctors that can see patients for sick visits, but many do, so it is worth a call to see if yours does. If you don't know where your local health department is, you can check in your phone book. The CDC also has a tool to help you locate your state's health department, and many state websites also have listings of all of their local health departments.  3) Find a [facility] If your illness is not likely to be very severe or complicated, you may want to try a walk in clinic. These are popping up all over the country in pharmacies, drugstores, and shopping centers. They're usually staffed by nurse practitioners or physician assistants that have been trained to treat common illnesses and complaints. They're usually fairly quick and inexpensive. However, if you have serious medical issues or chronic medical  problems, these are probably not your best option.  No Primary Care Doctor: - Call Health Connect at  [phone] - they can help you locate a primary care doctor that  accepts your insurance, provides certain services, etc. - Physician Referral [phone]    Dental Care: Organization         Address  Phone  Notes  [facility] Department of [facility] [phone] Accepts children up to age 21 who are enrolled in [place] or [place]; pregnant women with a Medicaid card; and children who have applied for Medicaid or [place] Health Choice, but were declined, whose parents can pay a reduced fee at time of service.  [facility] Department of [facility]  [address], [place] [phone] Accepts children up to age 2 who are enrolled in [place] or [place]; pregnant women with a Medicaid card; and children who have applied for Medicaid or [place], but were declined, whose parents can pay a  reduced fee at time of service.  [facility]  [place] [phone] Patients are seen by appointment only. Walk-ins are not accepted. [place] will see patients 52 years of age and older. Monday - Tuesday (8am-5pm) Most Wednesdays (8:30-5pm) $30 per visit, cash only  [facility] Adult Dental Access PROGRAM  [address], [facility] [phone] Patients are seen by appointment only. Walk-ins are not accepted. [place] will see patients 30 years of age and older. One Wednesday Evening (Monthly: Volunteer Based).  $30 per visit, cash only  [place]  [phone] for adults; Children under age 6, call Graduate Pediatric Dentistry at [phone]. Children aged 6-14, please call [phone] to request a pediatric application.  Dental services are provided in all areas of dental care including  fillings, crowns and bridges, complete and partial dentures, implants, gum treatment, root canals, and extractions. Preventive care is also provided. Treatment is provided to both adults and children. Patients are selected via a lottery and there is often a waiting list.   [facility] [address], [place]  [phone] [email or web address]   [facility] [address] [place], [place] [phone], Ext. [phone] Second and Fourth Thursday of each month, opens at 6:30 AM; Clinic ends at 9 AM.  Patients are seen on a first-come first-served basis, and a limited number are seen during each clinic.   [facility]  [address] [place], [place] [phone]   Eligibility Requirements You must have lived in [place], [place], or [place] counties for at least the last three months.   You cannot be eligible for state or federal sponsored [employer], including [employer], [place], or [employer].   You generally cannot be eligible for healthcare insurance through your employer.    How to apply: Eligibility screenings are held every Tuesday and Wednesday afternoon from 1:00 pm until 4:00 pm. You do not need an appointment for the interview!  [facility] [address] [place], [place]   [place]  [phone]

## 2014-06-07 NOTE — ED Provider Notes (Signed)
CSN: [id number]     Arrival date & time 06/03/14  2040 History   First MD Initiated Contact with Patient 06/03/14 2115     Chief Complaint  Patient presents with  . Dental Pain     (Consider location/radiation/quality/duration/timing/severity/associated sxs/prior Treatment) Patient is a 38 y.o. female presenting with tooth pain. The history is provided by the patient. No language interpreter was used.  Dental Pain Location:  Lower Associated symptoms: no fever   Associated symptoms comment:  Lower molar pain that is recurrent from known cavity. She reports dental appointment next week. No fever or facial swelling.   Past Medical History  Diagnosis Date  . Migraine   . Seizures   . Brain injury   . Diabetes mellitus without complication   . Asthma   . Bronchitis, chronic    Past Surgical History  Procedure Laterality Date  . Tubal ligation    . Traumatic brain injury    . Video bronchoscopy Bilateral 11/23/2013    Procedure: VIDEO BRONCHOSCOPY WITH FLUORO;  Surgeon: Rigoberto Noel, MD;  Location: [facility];  Service: Cardiopulmonary;  Laterality: Bilateral;   History reviewed. No pertinent family history. History  Substance Use Topics  . Smoking status: Former Smoker -- 24 years    Quit date: 01/04/2014  . Smokeless tobacco: Never Used  . Alcohol Use: No   OB History   Grav Para Term Preterm Abortions TAB SAB Ect Mult Living                 Review of Systems  Constitutional: Negative for fever.  HENT: Positive for dental problem. Negative for sore throat and trouble swallowing.       Allergies  Cephalosporins; Penicillins; Erythromycin; Tramadol; and Vicodin  Home Medications   Prior to Admission medications   Medication Sig Start Date End Date Taking? Authorizing Provider  albuterol (PROVENTIL HFA;VENTOLIN HFA) 108 (90 BASE) MCG/ACT inhaler Inhale 2 puffs into the lungs every 4 (four) hours as needed for wheezing or shortness of breath. 11/24/13   Shanker Kristeen Mans, MD  oxyCODONE-acetaminophen (PERCOCET/ROXICET) 5-325 MG per tablet Take 1-2 tablets by mouth every 4 (four) hours as needed for severe pain. 06/03/14   Daisie Haft A Maresa Morash, PA-C  phenytoin (DILANTIN) 100 MG ER capsule Take 2 capsules (200 mg total) by mouth 2 (two) times daily. 11/24/13   Shanker Kristeen Mans, MD  Tiotropium Bromide Monohydrate (SPIRIVA RESPIMAT) 2.5 MCG/ACT AERS Inhale 2 puffs into the lungs daily. 05/26/14   Rigoberto Noel, MD   BP 115/85  Pulse 83  Temp(Src) 98.1 F (36.7 C)  Resp 18  SpO2 98%  LMP 04/25/2014 Physical Exam  Constitutional: She appears well-developed and well-nourished. No distress.  HENT:  Generally good dentition. Lower right molar carie noted without obvious abscess.   Lymphadenopathy:    She has no cervical adenopathy.    ED Course  Procedures (including critical care time) Labs Review Labs Reviewed - No data to display  Imaging Review No results found.   EKG Interpretation None      MDM   Final diagnoses:  Dental decay    She is encouraged to keep dental appointment for next week.     Dewaine Oats, PA-C 06/07/14 1335

## 2014-06-07 NOTE — ED Provider Notes (Signed)
Medical screening examination/treatment/procedure(s) were performed by non-physician practitioner and as supervising physician I was immediately available for consultation/collaboration.   EKG Interpretation None        [place], DO 06/07/14 1657

## 2014-06-25 IMAGING — CR DG CHEST 2V
2 series · 2 of 2 positions shown · non-contrast
Comparison: Prior chest x-ray 11/21/2013

CLINICAL DATA: Followup pneumonia

EXAM:
CHEST  2 VIEW

[view not recorded (1 of 2)]
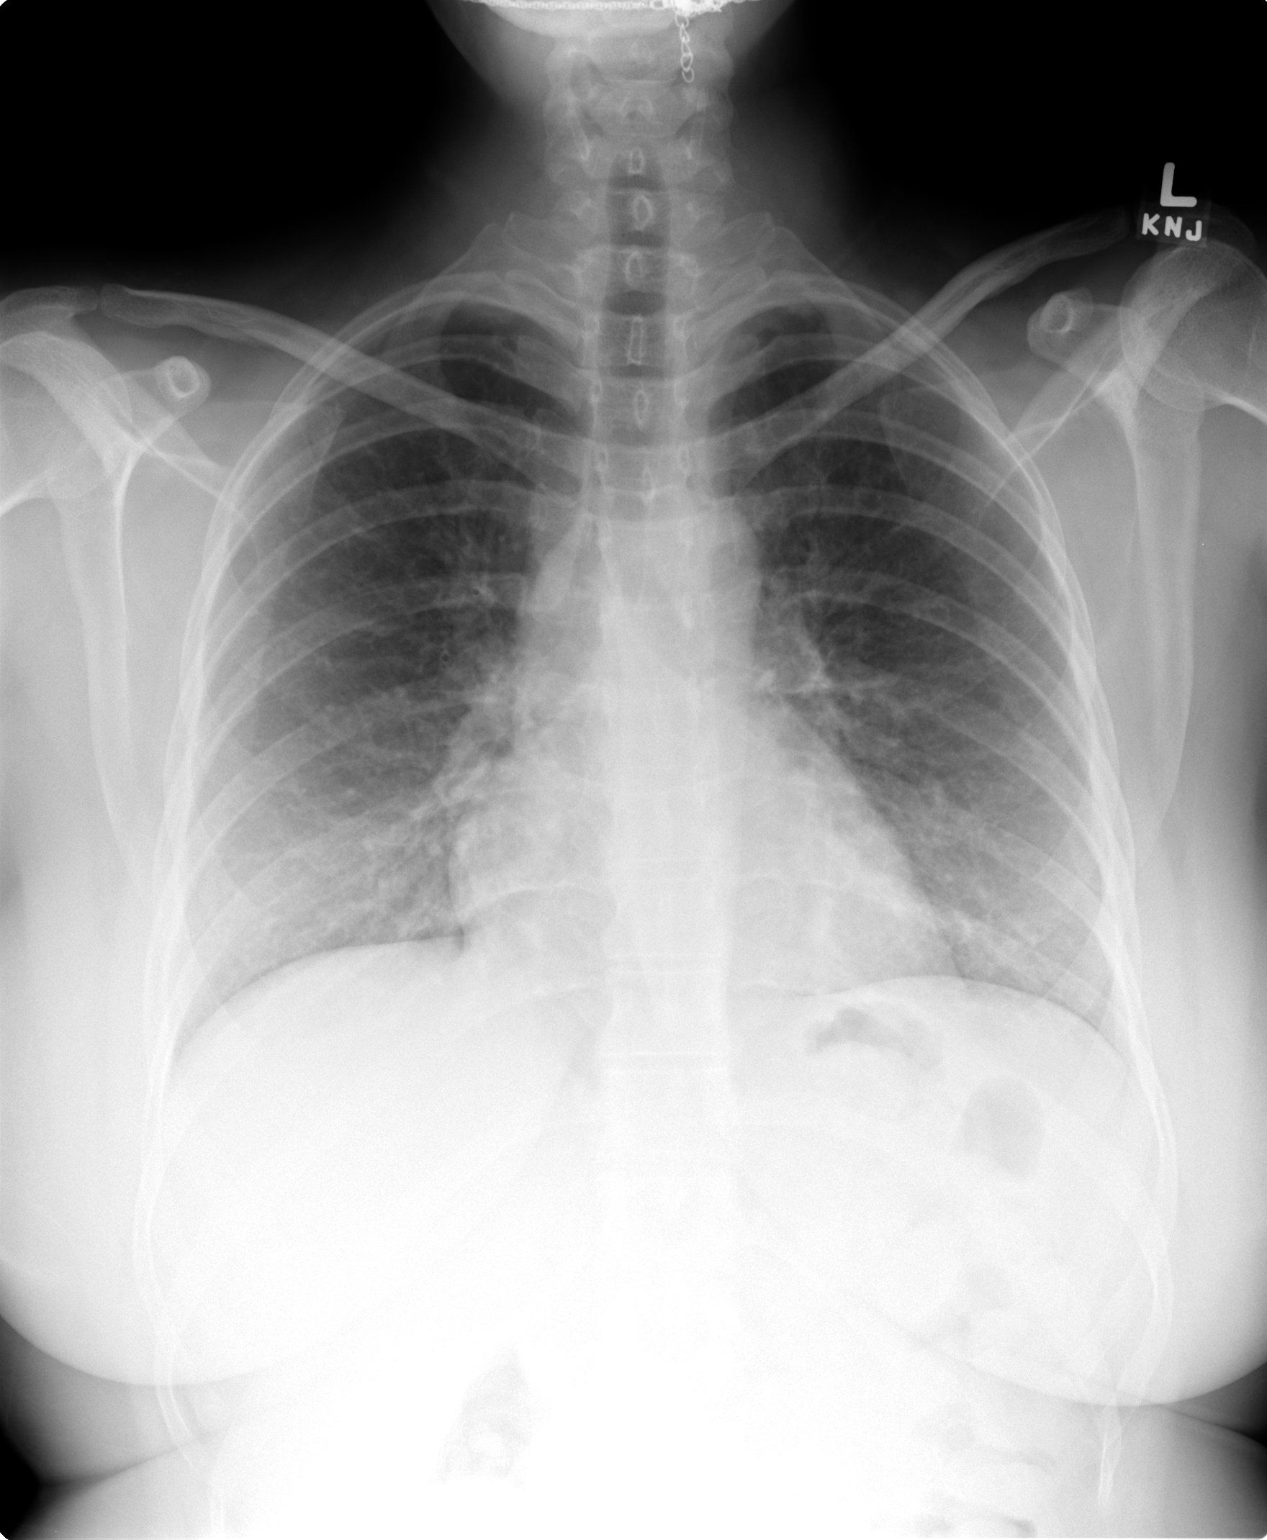

[view not recorded (2 of 2)]
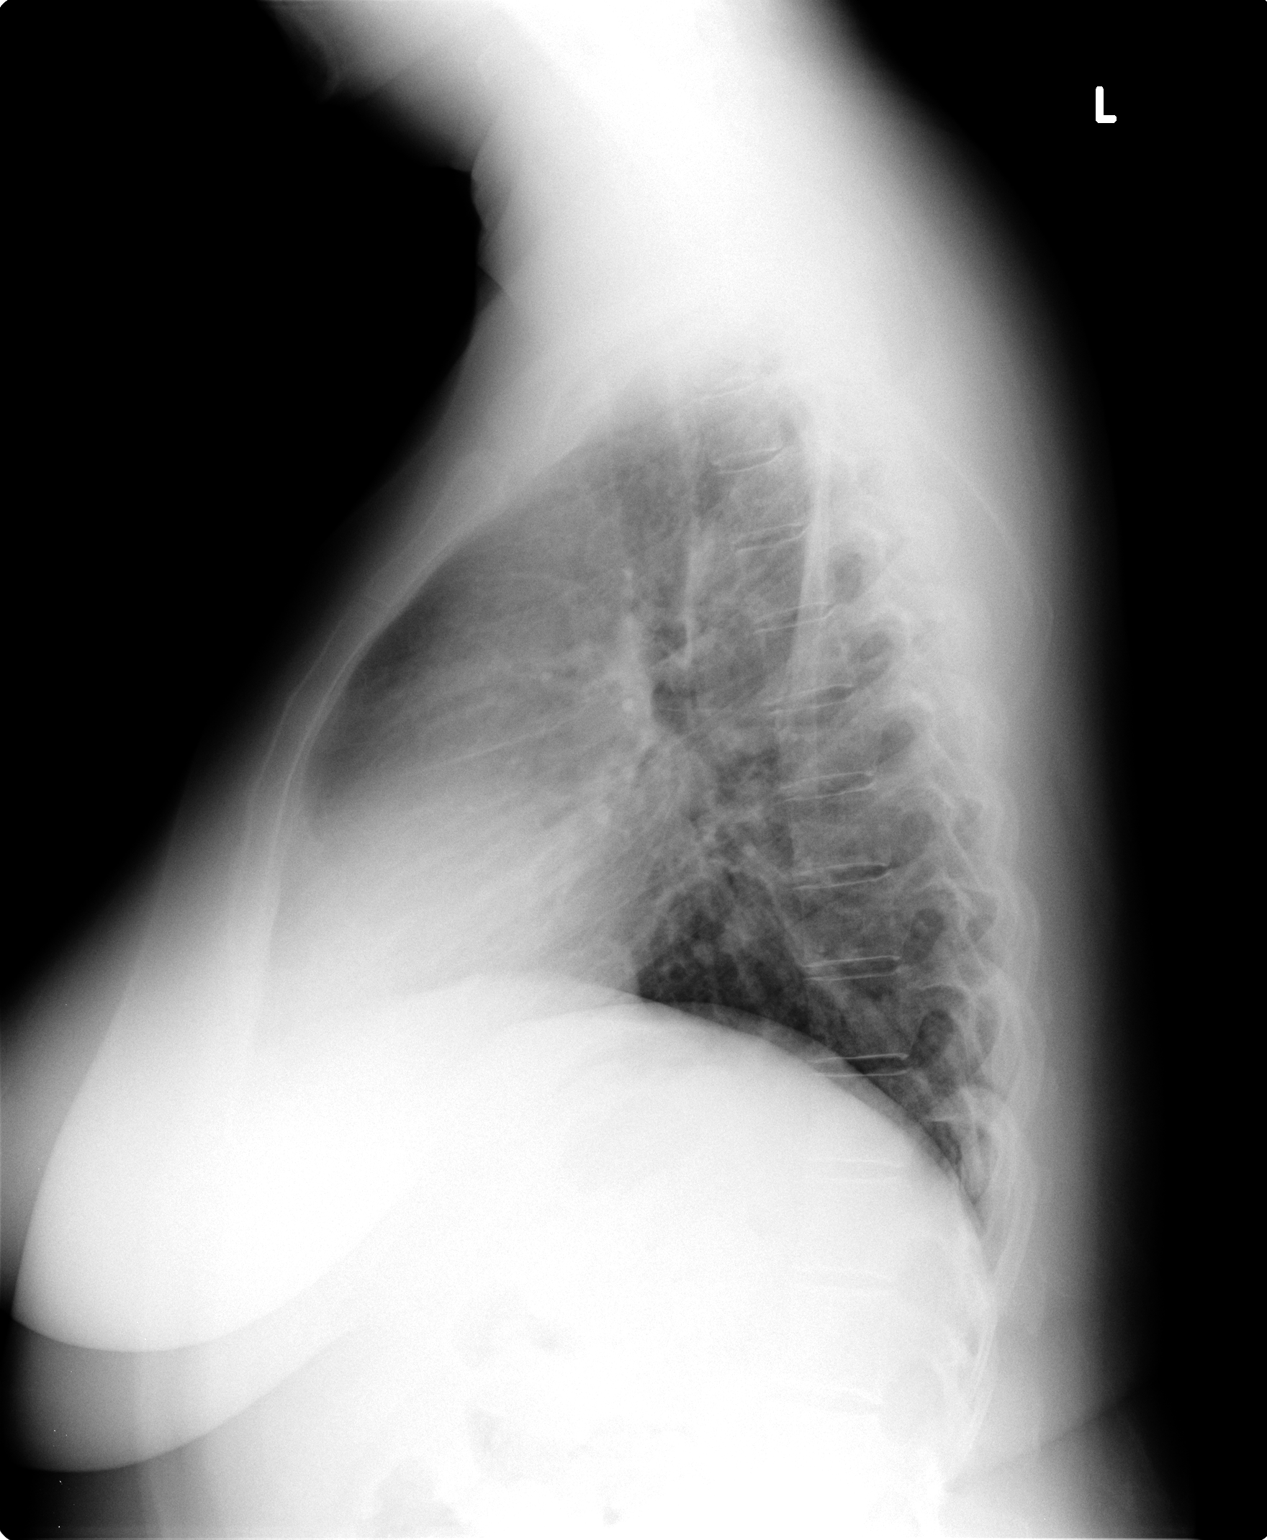

[2 of 2 positions shown; findings below may reference images not displayed]

FINDINGS: Improving bilateral airspace opacities. The lung bases are nearly
clear on today's evaluation. Stable cardiac and mediastinal
contours. No pleural effusion or pneumothorax. No acute osseous
abnormality. Visualized upper abdominal bowel gas pattern is
unremarkable.
IMPRESSION: Near-total interval resolution of bibasilar airspace opacities.

No acute cardiopulmonary process.

## 2014-07-17 ENCOUNTER — Ambulatory Visit: Payer: Self-pay | Admitting: Adult Health

## 2014-08-16 ENCOUNTER — Emergency Department (HOSPITAL_BASED_OUTPATIENT_CLINIC_OR_DEPARTMENT_OTHER)
Admission: EM | Admit: 2014-08-16 | Discharge: 2014-08-16 | Disposition: A | Discharge status: Home / Self Care | Payer: Self-pay | Attending: Emergency Medicine | Admitting: Emergency Medicine

## 2014-08-16 ENCOUNTER — Emergency Department (HOSPITAL_BASED_OUTPATIENT_CLINIC_OR_DEPARTMENT_OTHER): Payer: Self-pay

## 2014-08-16 ENCOUNTER — Encounter (HOSPITAL_BASED_OUTPATIENT_CLINIC_OR_DEPARTMENT_OTHER): Payer: Self-pay | Admitting: Emergency Medicine

## 2014-08-16 DIAGNOSIS — E119 Type 2 diabetes mellitus without complications: Secondary | ICD-10-CM | POA: Insufficient documentation

## 2014-08-16 DIAGNOSIS — J45909 Unspecified asthma, uncomplicated: Secondary | ICD-10-CM | POA: Insufficient documentation

## 2014-08-16 DIAGNOSIS — Z87891 Personal history of nicotine dependence: Secondary | ICD-10-CM | POA: Insufficient documentation

## 2014-08-16 DIAGNOSIS — R0789 Other chest pain: Secondary | ICD-10-CM | POA: Insufficient documentation

## 2014-08-16 DIAGNOSIS — G43909 Migraine, unspecified, not intractable, without status migrainosus: Secondary | ICD-10-CM | POA: Insufficient documentation

## 2014-08-16 DIAGNOSIS — R079 Chest pain, unspecified: Secondary | ICD-10-CM | POA: Insufficient documentation

## 2014-08-16 DIAGNOSIS — Z880 Allergy status to penicillin: Secondary | ICD-10-CM | POA: Insufficient documentation

## 2014-08-16 DIAGNOSIS — Z87820 Personal history of traumatic brain injury: Secondary | ICD-10-CM | POA: Insufficient documentation

## 2014-08-16 DIAGNOSIS — Z79899 Other long term (current) drug therapy: Secondary | ICD-10-CM | POA: Insufficient documentation

## 2014-08-16 DIAGNOSIS — G40909 Epilepsy, unspecified, not intractable, without status epilepticus: Secondary | ICD-10-CM | POA: Insufficient documentation

## 2014-08-16 DIAGNOSIS — M549 Dorsalgia, unspecified: Secondary | ICD-10-CM | POA: Insufficient documentation

## 2014-08-16 LAB — BASIC METABOLIC PANEL
Anion gap: 18 — ABNORMAL HIGH (ref 5–15)
BUN: 11 mg/dL (ref 6–23)
CHLORIDE: 99 meq/L (ref 96–112)
CO2: 21 meq/L (ref 19–32)
CREATININE: 1 mg/dL (ref 0.50–1.10)
Calcium: 9.8 mg/dL (ref 8.4–10.5)
GFR calc Af Amer: 82 mL/min — ABNORMAL LOW (ref >90)
GFR calc non Af Amer: 71 mL/min — ABNORMAL LOW (ref >90)
GLUCOSE: 112 mg/dL — AB (ref 70–99)
POTASSIUM: 3.2 meq/L — AB (ref 3.7–5.3)
Sodium: 138 mEq/L (ref 137–147)

## 2014-08-16 LAB — TROPONIN I

## 2014-08-16 LAB — D-DIMER, QUANTITATIVE: D-Dimer, Quant: 0.35 ug/mL-FEU (ref 0.00–0.48)

## 2014-08-16 LAB — CBC WITH DIFFERENTIAL/PLATELET
Basophils Absolute: 0 10*3/uL (ref 0.0–0.1)
Basophils Relative: 0 % (ref 0–1)
Eosinophils Absolute: 0.4 10*3/uL (ref 0.0–0.7)
Eosinophils Relative: 2 % (ref 0–5)
HEMATOCRIT: 42.7 % (ref 36.0–46.0)
Hemoglobin: 14.5 g/dL (ref 12.0–15.0)
LYMPHS ABS: 4.8 10*3/uL — AB (ref 0.7–4.0)
Lymphocytes Relative: 28 % (ref 12–46)
MCH: 31.3 pg (ref 26.0–34.0)
MCHC: 34 g/dL (ref 30.0–36.0)
MCV: 92.2 fL (ref 78.0–100.0)
Monocytes Absolute: 1.3 10*3/uL — ABNORMAL HIGH (ref 0.1–1.0)
Monocytes Relative: 7 % (ref 3–12)
NEUTROS ABS: 10.8 10*3/uL — AB (ref 1.7–7.7)
NEUTROS PCT: 63 % (ref 43–77)
Platelets: 429 10*3/uL — ABNORMAL HIGH (ref 150–400)
RBC: 4.63 MIL/uL (ref 3.87–5.11)
RDW: 13.5 % (ref 11.5–15.5)
WBC: 17.3 10*3/uL — ABNORMAL HIGH (ref 4.0–10.5)

## 2014-08-16 LAB — URINALYSIS, ROUTINE W REFLEX MICROSCOPIC
Bilirubin Urine: NEGATIVE
GLUCOSE, UA: NEGATIVE mg/dL
KETONES UR: NEGATIVE mg/dL
LEUKOCYTES UA: NEGATIVE
NITRITE: NEGATIVE
PH: 5.5 (ref 5.0–8.0)
Protein, ur: NEGATIVE mg/dL
SPECIFIC GRAVITY, URINE: 1.013 (ref 1.005–1.030)
Urobilinogen, UA: 0.2 mg/dL (ref 0.0–1.0)

## 2014-08-16 LAB — URINE MICROSCOPIC-ADD ON

## 2014-08-16 MED ORDER — ALBUTEROL SULFATE (2.5 MG/3ML) 0.083% IN NEBU
2.5000 mg | INHALATION_SOLUTION | Freq: Once | RESPIRATORY_TRACT | Status: AC
  Administered 2014-08-16: 2.5 mg via RESPIRATORY_TRACT
  Filled 2014-08-16: qty 3

## 2014-08-16 MED ORDER — OXYCODONE-ACETAMINOPHEN 5-325 MG PO TABS: 1.0000 | ORAL_TABLET | Freq: Four times a day (QID) | ORAL | Status: DC | PRN

## 2014-08-16 MED ORDER — AZITHROMYCIN 250 MG PO TABS: ORAL_TABLET | ORAL | Status: DC

## 2014-08-16 MED ORDER — FENTANYL CITRATE 0.05 MG/ML IJ SOLN
100.0000 ug | Freq: Once | INTRAMUSCULAR | Status: AC
  Administered 2014-08-16: 100 ug via INTRAVENOUS
  Filled 2014-08-16: qty 2

## 2014-08-16 MED ORDER — IPRATROPIUM-ALBUTEROL 0.5-2.5 (3) MG/3ML IN SOLN
3.0000 mL | Freq: Once | RESPIRATORY_TRACT | Status: AC
  Administered 2014-08-16: 3 mL via RESPIRATORY_TRACT
  Filled 2014-08-16: qty 3

## 2014-08-16 MED ORDER — LEVOFLOXACIN 750 MG PO TABS
750.0000 mg | ORAL_TABLET | Freq: Once | ORAL | Status: AC
  Administered 2014-08-16: 750 mg via ORAL
  Filled 2014-08-16: qty 1

## 2014-08-16 NOTE — ED Notes (Signed)
Pt reports her mid back has been hurting since this morning and now also hurts in her left chest. Reports subjective SOB. "I got hot at work, stated sweating and threw up."

## 2014-08-16 NOTE — ED Notes (Signed)
Pt c/o upper back pain onset last pm getting worse, tonight got hot at work w pain under left breast sob  States vomited x 1 denies urinary sx

## 2014-08-16 NOTE — ED Provider Notes (Addendum)
CSN: [id number]     Arrival date & time 08/16/14  0013 History   First MD Initiated Contact with Patient 08/16/14 0208     Chief Complaint  Patient presents with  . Chest Pain     (Consider location/radiation/quality/duration/timing/severity/associated sxs/prior Treatment) HPI 38 year old female with a history of chronic lung disease. She has had pain in her left upper back for the past 2 days. She was at work about 9 PM yesterday evening developed the sudden onset of a sharp, well localized, pleuritic pain under her left breast. Pain was worse with deep breathing. She felt short of breath, hot, sweaty and nauseated. She vomited one time. The symptoms lasted about 3 hours and subsequently abated apart from the left upper back pain which remains. The symptoms made her very anxious but she denies hyperventilation. The pain in her chest was moderate in severity. She has no history of similar pain. She denies pain or swelling in her legs. She denies recent prolonged travel.  Past Medical History  Diagnosis Date  . Migraine   . Seizures   . Brain injury   . Diabetes mellitus without complication   . Asthma   . Bronchitis, chronic    Past Surgical History  Procedure Laterality Date  . Tubal ligation    . Traumatic brain injury    . Video bronchoscopy Bilateral 11/23/2013    Procedure: VIDEO BRONCHOSCOPY WITH FLUORO;  Surgeon: Rigoberto Noel, MD;  Location: [place];  Service: Cardiopulmonary;  Laterality: Bilateral;   History reviewed. No pertinent family history. History  Substance Use Topics  . Smoking status: Former Smoker -- 24 years    Quit date: 01/04/2014  . Smokeless tobacco: Never Used  . Alcohol Use: No   OB History   Grav Para Term Preterm Abortions TAB SAB Ect Mult Living                 Review of Systems  All other systems reviewed and are negative.  Allergies  Cephalosporins; Penicillins; Erythromycin; Tramadol; and Vicodin  Home Medications   Prior to  Admission medications   Medication Sig Start Date End Date Taking? Authorizing Provider  albuterol (PROVENTIL HFA;VENTOLIN HFA) 108 (90 BASE) MCG/ACT inhaler Inhale 2 puffs into the lungs every 4 (four) hours as needed for wheezing or shortness of breath. 11/24/13  Yes Shanker Kristeen Mans, MD  gabapentin (NEURONTIN) 300 MG capsule Take 300 mg by mouth 3 (three) times daily.   Yes Historical Provider, MD  phenytoin (DILANTIN) 100 MG ER capsule Take 2 capsules (200 mg total) by mouth 2 (two) times daily. 11/24/13  Yes Shanker Kristeen Mans, MD  Tiotropium Bromide Monohydrate (SPIRIVA RESPIMAT) 2.5 MCG/ACT AERS Inhale 2 puffs into the lungs daily. 05/26/14  Yes Rigoberto Noel, MD  oxyCODONE-acetaminophen (PERCOCET/ROXICET) 5-325 MG per tablet Take 1-2 tablets by mouth every 4 (four) hours as needed for severe pain. 06/03/14   Shari A Upstill, PA-C   BP 134/83  Pulse 96  Temp(Src) 98.3 F (36.8 C) (Oral)  Resp 18  Ht 5\' 1"  (1.549 m)  Wt 150 lb (68.04 kg)  BMI 28.36 kg/m2  SpO2 97%  LMP 08/14/2014  Physical Exam General: Well-developed, well-nourished female in no acute distress; appearance consistent with age of record HENT: normocephalic; atraumatic Eyes: pupils equal, round and reactive to light; extraocular muscles intact Neck: supple Heart: regular rate and rhythm; no murmurs, rubs or gallops Lungs: clear to auscultation bilaterally Abdomen: soft; nondistended; nontender; no masses or hepatosplenomegaly; bowel sounds  present Back: Left upper soft tissue tenderness medial to left scapula Extremities: No deformity; full range of motion; pulses normal Neurologic: Awake, alert and oriented; motor function intact in all extremities and symmetric; no facial droop Skin: Warm and dry Psychiatric: Anxious    ED Course  Procedures (including critical care time)   EKG Interpretation   Date/Time:  Wednesday August 16 2014 00:25:19 EDT Ventricular Rate:  93 PR Interval:  154 QRS Duration: 78 QT  Interval:  352 QTC Calculation: 437 R Axis:   18 Text Interpretation:  Normal sinus rhythm Normal ECG No significant change  was found Confirmed by Hayley Horn  MD, Jenny Reichmann ([id number]) on 08/16/2014 12:38:32 AM      MDM  Nursing notes and vitals signs, including pulse oximetry, reviewed.  Summary of this visit's results, reviewed by myself:  Labs:  Results for orders placed during the hospital encounter of 08/16/14 (from the past 24 hour(s))  CBC WITH DIFFERENTIAL     Status: Abnormal   Collection Time    08/16/14  2:30 AM      Result Value Ref Range   WBC 17.3 (*) 4.0 - 10.5 K/uL   RBC 4.63  3.87 - 5.11 MIL/uL   Hemoglobin 14.5  12.0 - 15.0 g/dL   HCT 42.7  36.0 - 46.0 %   MCV 92.2  78.0 - 100.0 fL   MCH 31.3  26.0 - 34.0 pg   MCHC 34.0  30.0 - 36.0 g/dL   RDW 13.5  11.5 - 15.5 %   Platelets 429 (*) 150 - 400 K/uL   Neutrophils Relative % 63  43 - 77 %   Neutro Abs 10.8 (*) 1.7 - 7.7 K/uL   Lymphocytes Relative 28  12 - 46 %   Lymphs Abs 4.8 (*) 0.7 - 4.0 K/uL   Monocytes Relative 7  3 - 12 %   Monocytes Absolute 1.3 (*) 0.1 - 1.0 K/uL   Eosinophils Relative 2  0 - 5 %   Eosinophils Absolute 0.4  0.0 - 0.7 K/uL   Basophils Relative 0  0 - 1 %   Basophils Absolute 0.0  0.0 - 0.1 K/uL  BASIC METABOLIC PANEL     Status: Abnormal   Collection Time    08/16/14  2:30 AM      Result Value Ref Range   Sodium 138  137 - 147 mEq/L   Potassium 3.2 (*) 3.7 - 5.3 mEq/L   Chloride 99  96 - 112 mEq/L   CO2 21  19 - 32 mEq/L   Glucose, Bld 112 (*) 70 - 99 mg/dL   BUN 11  6 - 23 mg/dL   Creatinine, Ser 1.00  0.50 - 1.10 mg/dL   Calcium 9.8  8.4 - 10.5 mg/dL   GFR calc non Af Amer 71 (*) >90 mL/min   GFR calc Af Amer 82 (*) >90 mL/min   Anion gap 18 (*) 5 - 15  D-DIMER, QUANTITATIVE     Status: None   Collection Time    08/16/14  2:30 AM      Result Value Ref Range   D-Dimer, Quant 0.35  0.00 - 0.48 ug/mL-FEU  TROPONIN I     Status: None   Collection Time    08/16/14  2:30 AM       Result Value Ref Range   Troponin I <0.30  <0.30 ng/mL  URINALYSIS, ROUTINE W REFLEX MICROSCOPIC     Status: Abnormal   Collection Time  08/16/14  5:09 AM      Result Value Ref Range   Color, Urine YELLOW  YELLOW   APPearance CLOUDY (*) CLEAR   Specific Gravity, Urine 1.013  1.005 - 1.030   pH 5.5  5.0 - 8.0   Glucose, UA NEGATIVE  NEGATIVE mg/dL   Hgb urine dipstick TRACE (*) NEGATIVE   Bilirubin Urine NEGATIVE  NEGATIVE   Ketones, ur NEGATIVE  NEGATIVE mg/dL   Protein, ur NEGATIVE  NEGATIVE mg/dL   Urobilinogen, UA 0.2  0.0 - 1.0 mg/dL   Nitrite NEGATIVE  NEGATIVE   Leukocytes, UA NEGATIVE  NEGATIVE  URINE MICROSCOPIC-ADD ON     Status: Abnormal   Collection Time    08/16/14  5:09 AM      Result Value Ref Range   Squamous Epithelial / LPF FEW (*) RARE   WBC, UA 0-2  <3 WBC/hpf   RBC / HPF 0-2  <3 RBC/hpf   Bacteria, UA RARE  RARE  TROPONIN I     Status: None   Collection Time    08/16/14  6:05 AM      Result Value Ref Range   Troponin I <0.30  <0.30 ng/mL    Imaging Studies: Dg Chest 2 View  08/16/2014   CLINICAL DATA:  Shortness of breath. Upper back pain. Left chest pain. Nausea. Vomiting.  EXAM: CHEST  2 VIEW  COMPARISON:  05/15/2014  FINDINGS: The heart size and mediastinal contours are within normal limits. Both lungs are clear. The visualized skeletal structures are unremarkable.  IMPRESSION: No active cardiopulmonary disease.   Electronically Signed   By: Lucienne Capers M.D.   On: 08/16/2014 03:16   4:37 AM Patient continues to be chest pain-free but complains of difficulty breathing. Breath sounds are diminished bilaterally. We will administer a neb treatment.  5:27 AM Air movement improved after albuterol/Atrovent neb treatment. Patient continues to be afebrile but states she "feels terrible". Upper back still hurting, no chest pain.   6:18 AM A specific cause for the patient's leukocytosis is not apparent but the last time the patient had a significant  leukocytosis she was diagnosed with, and treated for, bilateral pneumonia. As a precaution, given her chronic lung disease, we will treat her for possible early pneumonia. She is unable to afford Levaquin or doxycycline and is allergic to cephalosporins and erythromycin. She is known to tolerate Levaquin as well as Zithromax. We will start her with Levaquin in the ED and prescribe Zithromax. She was advised to follow up with her pulmonologist or to return for worsening symptoms.    Wynetta Fines, MD 08/16/14 [place], MD 08/16/14 [phone]

## 2014-08-30 ENCOUNTER — Telehealth: Payer: Self-pay | Admitting: Pulmonary Disease

## 2014-08-30 MED ORDER — TIOTROPIUM BROMIDE MONOHYDRATE 2.5 MCG/ACT IN AERS: 2.0000 | INHALATION_SPRAY | Freq: Every day | RESPIRATORY_TRACT | Status: DC

## 2014-08-30 NOTE — Telephone Encounter (Signed)
Sample arrived of Spiriva Respimat- I have placed at front for her to pick up at the same time she gets her Patient Assistance forms today. I have logged out in our sample book and documented in EPIC as well.

## 2014-08-30 NOTE — Addendum Note (Signed)
Addended by: Clayborne Dana C on: 08/30/2014 03:45 PM   Modules accepted: Orders

## 2014-08-30 NOTE — Telephone Encounter (Signed)
Called spoke with pt. Made her aware we do not have any spiriva samples at this time. She reports she never got the PA forms left for her in July. I advised her will leave the form for her again and to return them once done. Nothing further needed

## 2014-08-30 NOTE — Telephone Encounter (Signed)
Spoke with patient-she is aware that she will need to come to office to pick up Patient Assistance to get help getting Spiriva. Pt is aware that we currently do not have any samples at this time. Pt was thankful for having someone answer her questions and concerns as she felt she did not get her answers the first time she called. I apologized for any misunderstandings the first time she called. Nothing more needed at this time.

## 2014-09-01 ENCOUNTER — Telehealth: Payer: Self-pay | Admitting: Pulmonary Disease

## 2014-09-01 NOTE — Telephone Encounter (Signed)
Spoke with the pt  I asked what I could help her with  She states "I only need to talk to Joellen Jersey, she has been dealing with something" I advised Joellen Jersey is with a pt and again, tried to find out what was needed  Pt became extremely rude and states "how many times do I have to freaking tell you people who I need to talk to" Will forward to [facility]

## 2014-09-01 NOTE — Telephone Encounter (Signed)
Pt calling again.Christine Campbell ° °

## 2014-09-04 NOTE — Telephone Encounter (Signed)
Pt was calling back as she never got the message I had left her stating I left her 1 sample at front with her Pt assistance forms to complete and return to our office. Pt is aware and will come by to pick up package of sample and forms. Nothing more needed at this time.

## 2014-09-27 ENCOUNTER — Emergency Department (HOSPITAL_BASED_OUTPATIENT_CLINIC_OR_DEPARTMENT_OTHER)
Admission: EM | Admit: 2014-09-27 | Discharge: 2014-09-27 | Disposition: A | Discharge status: Home / Self Care | Payer: No Typology Code available for payment source | Attending: Emergency Medicine | Admitting: Emergency Medicine

## 2014-09-27 ENCOUNTER — Encounter (HOSPITAL_BASED_OUTPATIENT_CLINIC_OR_DEPARTMENT_OTHER): Payer: Self-pay

## 2014-09-27 DIAGNOSIS — G40909 Epilepsy, unspecified, not intractable, without status epilepticus: Secondary | ICD-10-CM | POA: Insufficient documentation

## 2014-09-27 DIAGNOSIS — H00014 Hordeolum externum left upper eyelid: Secondary | ICD-10-CM | POA: Insufficient documentation

## 2014-09-27 DIAGNOSIS — Z880 Allergy status to penicillin: Secondary | ICD-10-CM | POA: Insufficient documentation

## 2014-09-27 DIAGNOSIS — Z720 Tobacco use: Secondary | ICD-10-CM | POA: Insufficient documentation

## 2014-09-27 DIAGNOSIS — H00016 Hordeolum externum left eye, unspecified eyelid: Secondary | ICD-10-CM

## 2014-09-27 DIAGNOSIS — Z79899 Other long term (current) drug therapy: Secondary | ICD-10-CM | POA: Insufficient documentation

## 2014-09-27 DIAGNOSIS — E119 Type 2 diabetes mellitus without complications: Secondary | ICD-10-CM | POA: Insufficient documentation

## 2014-09-27 DIAGNOSIS — G43909 Migraine, unspecified, not intractable, without status migrainosus: Secondary | ICD-10-CM | POA: Insufficient documentation

## 2014-09-27 DIAGNOSIS — J45909 Unspecified asthma, uncomplicated: Secondary | ICD-10-CM | POA: Insufficient documentation

## 2014-09-27 DIAGNOSIS — Z87820 Personal history of traumatic brain injury: Secondary | ICD-10-CM | POA: Insufficient documentation

## 2014-09-27 MED ORDER — CIPROFLOXACIN HCL 0.3 % OP SOLN
1.0000 [drp] | Freq: Once | OPHTHALMIC | Status: AC
  Administered 2014-09-27: 1 [drp] via OPHTHALMIC
  Filled 2014-09-27: qty 2.5

## 2014-09-27 MED ORDER — OXYCODONE-ACETAMINOPHEN 5-325 MG PO TABS: 1.0000 | ORAL_TABLET | Freq: Three times a day (TID) | ORAL | Status: DC | PRN

## 2014-09-27 MED ORDER — OXYCODONE-ACETAMINOPHEN 5-325 MG PO TABS
1.0000 | ORAL_TABLET | Freq: Once | ORAL | Status: AC
  Administered 2014-09-27: 1 via ORAL
  Filled 2014-09-27: qty 1

## 2014-09-27 NOTE — Discharge Instructions (Signed)
As discussed, please continue to use warm compresses, up to 4 times daily.  Please use the provided antibiotic drops, every 4 hours for the next 2 days.  If your condition has not improved in 3 days, please be sure to follow up with our ophthalmologist.  If you develop new, or concerning changes, return here immediately.   Sty A sty (hordeolum) is an infection of a gland in the eyelid located at the base of the eyelash. A sty may develop a white or yellow head of pus. It can be puffy (swollen). Usually, the sty will burst and pus will come out on its own. They do not leave lumps in the eyelid once they drain. A sty is often confused with another form of cyst of the eyelid called a chalazion. Chalazions occur within the eyelid and not on the edge where the bases of the eyelashes are. They often are red, sore and then form firm lumps in the eyelid. CAUSES   Germs (bacteria).  Lasting (chronic) eyelid inflammation. SYMPTOMS   Tenderness, redness and swelling along the edge of the eyelid at the base of the eyelashes.  Sometimes, there is a white or yellow head of pus. It may or may not drain. DIAGNOSIS  An ophthalmologist will be able to distinguish between a sty and a chalazion and treat the condition appropriately.  TREATMENT   Styes are typically treated with warm packs (compresses) until drainage occurs.  In rare cases, medicines that kill germs (antibiotics) may be prescribed. These antibiotics may be in the form of drops, cream or pills.  If a hard lump has formed, it is generally necessary to do a small incision and remove the hardened contents of the cyst in a minor surgical procedure done in the office.  In suspicious cases, your caregiver may send the contents of the cyst to the lab to be certain that it is not a rare, but dangerous form of cancer of the glands of the eyelid. HOME CARE INSTRUCTIONS   Wash your hands often and dry them with a clean towel. Avoid touching your  eyelid. This may spread the infection to other parts of the eye.  Apply heat to your eyelid for 10 to 20 minutes, several times a day, to ease pain and help to heal it faster.  Do not squeeze the sty. Allow it to drain on its own. Wash your eyelid carefully 3 to 4 times per day to remove any pus. SEEK IMMEDIATE MEDICAL CARE IF:   Your eye becomes painful or puffy (swollen).  Your vision changes.  Your sty does not drain by itself within 3 days.  Your sty comes back within a short period of time, even with treatment.  You have redness (inflammation) around the eye.  You have a fever. Document Released: 08/13/2005 Document Revised: 01/26/2012 Document Reviewed: 02/17/2014 [facility] Patient Information 2015 [place], [place]. This information is not intended to replace advice given to you by your health care provider. Make sure you discuss any questions you have with your health care provider.

## 2014-09-27 NOTE — ED Notes (Signed)
C/o swelling to left upper eye lid-started yesterday-denies injury and is wearing makeup

## 2014-09-27 NOTE — ED Provider Notes (Signed)
CSN: [id number]     Arrival date & time 09/27/14  1759 History  This chart was scribed for Carmin Muskrat, MD by Stephania Fragmin, ED Scribe. This patient was seen in room MH08/MH08 and the patient's care was started at 7:19 PM.    Chief Complaint  Patient presents with  . Facial Swelling  The history is provided by the patient. No language interpreter was used.     HPI Comments: Leva Baine is a 38 y.o. female who presents to the Emergency Department complaining of left eye edema that began earlier this morning. She complains of associated pain, discharge, eye twitching, and headache. She denies use of contact lenses or glasses. She suspects a stye and reports a history of having a stye on her right eye that was less painful than her current left eye pain. She has tried ibuprofen and Visine eye drops. She denies visual deficits, fever, and disorientation.   Past Medical History  Diagnosis Date  . Migraine   . Seizures   . Brain injury   . Diabetes mellitus without complication   . Asthma   . Bronchitis, chronic    Past Surgical History  Procedure Laterality Date  . Tubal ligation    . Traumatic brain injury    . Video bronchoscopy Bilateral 11/23/2013    Procedure: VIDEO BRONCHOSCOPY WITH FLUORO;  Surgeon: Rigoberto Noel, MD;  Location: [place];  Service: Cardiopulmonary;  Laterality: Bilateral;   No family history on file. History  Substance Use Topics  . Smoking status: Current Every Day Smoker -- 24 years  . Smokeless tobacco: Never Used  . Alcohol Use: No   OB History    No data available     Review of Systems  Constitutional: Negative for fever.       Per HPI, otherwise negative  HENT:       Per HPI, otherwise negative  Respiratory:       Per HPI, otherwise negative  Cardiovascular:       Per HPI, otherwise negative  Gastrointestinal: Negative for vomiting.  Endocrine:       Negative aside from HPI  Genitourinary:       Neg aside from HPI   Musculoskeletal:        Per HPI, otherwise negative  Skin: Negative.   Neurological: Negative for syncope.  All other systems reviewed and are negative.     Allergies  Cephalosporins; Penicillins; Erythromycin; Tramadol; and Vicodin  Home Medications   Prior to Admission medications   Medication Sig Start Date End Date Taking? Authorizing Provider  albuterol (PROVENTIL HFA;VENTOLIN HFA) 108 (90 BASE) MCG/ACT inhaler Inhale 2 puffs into the lungs every 4 (four) hours as needed for wheezing or shortness of breath. 11/24/13   Shanker Kristeen Mans, MD  gabapentin (NEURONTIN) 300 MG capsule Take 300 mg by mouth 3 (three) times daily.    Historical Provider, MD  phenytoin (DILANTIN) 100 MG ER capsule Take 2 capsules (200 mg total) by mouth 2 (two) times daily. 11/24/13   Shanker Kristeen Mans, MD  Tiotropium Bromide Monohydrate (SPIRIVA RESPIMAT) 2.5 MCG/ACT AERS Inhale 2 puffs into the lungs daily. 08/30/14   Rigoberto Noel, MD   BP 120/70 mmHg  Pulse 83  Temp(Src) 97.9 F (36.6 C) (Oral)  Resp 18  Ht 5\' 1"  (1.549 m)  Wt 150 lb (68.04 kg)  BMI 28.36 kg/m2  SpO2 99%  LMP 08/30/2014 Physical Exam  Constitutional: She is oriented to person, place, and time. She appears  well-developed and well-nourished. No distress.  HENT:  Head: Normocephalic and atraumatic.  Eyes: Conjunctivae and EOM are normal. Pupils are equal, round, and reactive to light.  Full ROM in the left eye.  No foreign body in eyelid. Left lateral upper lid edema and erythema 0.5 cm in circumference.   Cardiovascular: Normal rate and regular rhythm.   Pulmonary/Chest: Effort normal. No stridor. No respiratory distress.  Abdominal: She exhibits no distension.  Musculoskeletal: She exhibits no edema.  Neurological: She is alert and oriented to person, place, and time. No cranial nerve deficit.  Skin: Skin is warm and dry.  Psychiatric: She has a normal mood and affect.  Nursing note and vitals reviewed.   ED Course  Procedures (including  critical care time)  DIAGNOSTIC STUDIES: Oxygen Saturation is 97% on room air, normal by my interpretation.    COORDINATION OF CARE: 7:24 PM-Discussed treatment plan which includes warm compresses, topical antibiotic, and oral pain medication with pt at bedside and pt agreed to plan.       MDM   Final diagnoses:  Hordeolum external, left  Patient presents with no visual loss, no evidence for sepsis or cellulitis, but with left hordeolum.  Patient discharged in stable condition after initiation of antibiotics, analgesics.  I personally performed the services described in this documentation, which was scribed in my presence. The recorded information has been reviewed and is accurate.     Carmin Muskrat, MD 09/27/14 [phone]

## 2014-10-05 ENCOUNTER — Emergency Department (HOSPITAL_BASED_OUTPATIENT_CLINIC_OR_DEPARTMENT_OTHER)
Admission: EM | Admit: 2014-10-05 | Discharge: 2014-10-05 | Disposition: A | Discharge status: Home / Self Care | Payer: No Typology Code available for payment source | Attending: Emergency Medicine | Admitting: Emergency Medicine

## 2014-10-05 ENCOUNTER — Encounter (HOSPITAL_BASED_OUTPATIENT_CLINIC_OR_DEPARTMENT_OTHER): Payer: Self-pay

## 2014-10-05 DIAGNOSIS — J45901 Unspecified asthma with (acute) exacerbation: Secondary | ICD-10-CM | POA: Insufficient documentation

## 2014-10-05 DIAGNOSIS — Z79899 Other long term (current) drug therapy: Secondary | ICD-10-CM | POA: Insufficient documentation

## 2014-10-05 DIAGNOSIS — Z87820 Personal history of traumatic brain injury: Secondary | ICD-10-CM | POA: Insufficient documentation

## 2014-10-05 DIAGNOSIS — G40909 Epilepsy, unspecified, not intractable, without status epilepticus: Secondary | ICD-10-CM | POA: Insufficient documentation

## 2014-10-05 DIAGNOSIS — K029 Dental caries, unspecified: Secondary | ICD-10-CM | POA: Insufficient documentation

## 2014-10-05 DIAGNOSIS — Z880 Allergy status to penicillin: Secondary | ICD-10-CM | POA: Insufficient documentation

## 2014-10-05 DIAGNOSIS — G43909 Migraine, unspecified, not intractable, without status migrainosus: Secondary | ICD-10-CM | POA: Insufficient documentation

## 2014-10-05 DIAGNOSIS — Z720 Tobacco use: Secondary | ICD-10-CM | POA: Insufficient documentation

## 2014-10-05 DIAGNOSIS — K0889 Other specified disorders of teeth and supporting structures: Secondary | ICD-10-CM

## 2014-10-05 DIAGNOSIS — E119 Type 2 diabetes mellitus without complications: Secondary | ICD-10-CM | POA: Insufficient documentation

## 2014-10-05 MED ORDER — IBUPROFEN 400 MG PO TABS
600.0000 mg | ORAL_TABLET | Freq: Once | ORAL | Status: AC
  Administered 2014-10-05: 600 mg via ORAL
  Filled 2014-10-05 (×2): qty 1

## 2014-10-05 MED ORDER — IBUPROFEN 600 MG PO TABS: 600.0000 mg | ORAL_TABLET | Freq: Four times a day (QID) | ORAL | Status: DC | PRN

## 2014-10-05 MED ORDER — OXYCODONE-ACETAMINOPHEN 5-325 MG PO TABS: 1.0000 | ORAL_TABLET | Freq: Four times a day (QID) | ORAL | Status: DC | PRN

## 2014-10-05 MED ORDER — OXYCODONE-ACETAMINOPHEN 5-325 MG PO TABS
1.0000 | ORAL_TABLET | Freq: Once | ORAL | Status: AC
Start: 2014-10-05 — End: 2014-10-05
  Administered 2014-10-05: 1 via ORAL
  Filled 2014-10-05: qty 1

## 2014-10-05 MED ORDER — CLINDAMYCIN HCL 150 MG PO CAPS: 300.0000 mg | ORAL_CAPSULE | Freq: Three times a day (TID) | ORAL | Status: DC

## 2014-10-05 NOTE — ED Notes (Signed)
C/o rt upper and left lower tooth pain x 1 week  And rt ear pain

## 2014-10-05 NOTE — ED Provider Notes (Signed)
CSN: [id number]     Arrival date & time 10/05/14  0117 History   First MD Initiated Contact with Patient 10/05/14 0136     Chief Complaint  Patient presents with  . Dental Pain     (Consider location/radiation/quality/duration/timing/severity/associated sxs/prior Treatment) HPI  Patient presents with dental pain. Patient reports a one-week history of left lower jaw pain and right upper dental pain. Denies any swelling or fever. History of the same. Does not currently have a dentist. Reports that she's been taking ibuprofen at home without relief. Current pain is 10 out of 10. She states "I just can't take it."  She also reports right ear pain. Denies any rhinorrhea, sore throat, or other URI symptoms.  Past Medical History  Diagnosis Date  . Migraine   . Seizures   . Brain injury   . Diabetes mellitus without complication   . Asthma   . Bronchitis, chronic    Past Surgical History  Procedure Laterality Date  . Tubal ligation    . Traumatic brain injury    . Video bronchoscopy Bilateral 11/23/2013    Procedure: VIDEO BRONCHOSCOPY WITH FLUORO;  Surgeon: Rigoberto Noel, MD;  Location: [place];  Service: Cardiopulmonary;  Laterality: Bilateral;   No family history on file. History  Substance Use Topics  . Smoking status: Current Every Day Smoker -- 24 years  . Smokeless tobacco: Never Used  . Alcohol Use: No   OB History    No data available     Review of Systems  Constitutional: Negative for fever.  HENT: Positive for dental problem.   All other systems reviewed and are negative.     Allergies  Cephalosporins; Penicillins; Erythromycin; Tramadol; and Vicodin  Home Medications   Prior to Admission medications   Medication Sig Start Date End Date Taking? Authorizing Provider  albuterol (PROVENTIL HFA;VENTOLIN HFA) 108 (90 BASE) MCG/ACT inhaler Inhale 2 puffs into the lungs every 4 (four) hours as needed for wheezing or shortness of breath. 11/24/13   Shanker Kristeen Mans, MD  clindamycin (CLEOCIN) 150 MG capsule Take 2 capsules (300 mg total) by mouth 3 (three) times daily. 10/05/14   Merryl Hacker, MD  gabapentin (NEURONTIN) 300 MG capsule Take 300 mg by mouth 3 (three) times daily.    Historical Provider, MD  ibuprofen (ADVIL,MOTRIN) 600 MG tablet Take 1 tablet (600 mg total) by mouth every 6 (six) hours as needed. 10/05/14   Merryl Hacker, MD  oxyCODONE-acetaminophen (PERCOCET/ROXICET) 5-325 MG per tablet Take 1 tablet by mouth every 8 (eight) hours as needed for severe pain. 09/27/14   Carmin Muskrat, MD  oxyCODONE-acetaminophen (PERCOCET/ROXICET) 5-325 MG per tablet Take 1 tablet by mouth every 6 (six) hours as needed for severe pain. 10/05/14   Merryl Hacker, MD  phenytoin (DILANTIN) 100 MG ER capsule Take 2 capsules (200 mg total) by mouth 2 (two) times daily. 11/24/13   Shanker Kristeen Mans, MD  Tiotropium Bromide Monohydrate (SPIRIVA RESPIMAT) 2.5 MCG/ACT AERS Inhale 2 puffs into the lungs daily. 08/30/14   Rigoberto Noel, MD   BP 154/86 mmHg  Pulse 70  Temp(Src) 98.6 F (37 C) (Oral)  Resp 20  Ht 5\' 1"  (1.549 m)  Wt 150 lb (68.04 kg)  BMI 28.36 kg/m2  SpO2 98%  LMP 08/30/2014 Physical Exam  Constitutional: She is oriented to person, place, and time. She appears well-developed and well-nourished. No distress.  HENT:  Head: Normocephalic and atraumatic.  Right Ear: External ear normal.  Left Ear: External ear normal.  Generally poor dentition with multiple missing teeth and dental caries, fractured right upper incisor, no obvious abscess, no trismus  Eyes: Pupils are equal, round, and reactive to light.  Neck: Neck supple.  Cardiovascular: Normal rate and regular rhythm.   Pulmonary/Chest: Effort normal. No respiratory distress.  Lymphadenopathy:    She has no cervical adenopathy.  Neurological: She is alert and oriented to person, place, and time.  Skin: Skin is warm and dry.  Psychiatric: She has a normal mood and affect.   Nursing note and vitals reviewed.   ED Course  Procedures (including critical care time) Labs Review Labs Reviewed - No data to display  Imaging Review No results found.   EKG Interpretation None      MDM   Final diagnoses:  Pain, dental  Dental caries    Patient presents with dental pain and ear pain. Nontoxic on exam. Multiple old dental fractures and dental caries. No obvious abscess on exam. Suspect ear pain is related to dental pain. Patient was given pain medication. She will be discharged with ibuprofen, Percocet, and clindamycin. She was given a Warden/ranger for dental follow-up.  After history, exam, and medical workup I feel the patient has been appropriately medically screened and is safe for discharge home. Pertinent diagnoses were discussed with the patient. Patient was given return precautions.     Merryl Hacker, MD 10/05/14 [phone]

## 2014-10-05 NOTE — Discharge Instructions (Signed)
Dental Caries °Dental caries (also called tooth decay) is the most common oral disease. It can occur at any age but is more common in children and young adults.  °HOW DENTAL CARIES DEVELOPS  °The process of decay begins when bacteria and foods (particularly sugars and starches) combine in your mouth to produce plaque. Plaque is a substance that sticks to the hard, outer surface of a tooth (enamel). The bacteria in plaque produce acids that attack enamel. These acids may also attack the root surface of a tooth (cementum) if it is exposed. Repeated attacks dissolve these surfaces and create holes in the tooth (cavities). If left untreated, the acids destroy the other layers of the tooth.  °RISK FACTORS °· Frequent sipping of sugary beverages.   °· Frequent snacking on sugary and starchy foods, especially those that easily get stuck in the teeth.   °· Poor oral hygiene.   °· Dry mouth.   °· Substance abuse such as methamphetamine abuse.   °· Broken or poor-fitting dental restorations.   °· Eating disorders.   °· Gastroesophageal reflux disease (GERD).   °· Certain radiation treatments to the head and neck. °SYMPTOMS °In the early stages of dental caries, symptoms are seldom present. Sometimes white, chalky areas may be seen on the enamel or other tooth layers. In later stages, symptoms may include: °· Pits and holes on the enamel. °· Toothache after sweet, hot, or cold foods or drinks are consumed. °· Pain around the tooth. °· Swelling around the tooth. °DIAGNOSIS  °Most of the time, dental caries is detected during a regular dental checkup. A diagnosis is made after a thorough medical and dental history is taken and the surfaces of your teeth are checked for signs of dental caries. Sometimes special instruments, such as lasers, are used to check for dental caries. Dental X-ray exams may be taken so that areas not visible to the eye (such as between the contact areas of the teeth) can be checked for cavities.    °TREATMENT  °If dental caries is in its early stages, it may be reversed with a fluoride treatment or an application of a remineralizing agent at the dental office. Thorough brushing and flossing at home is needed to aid these treatments. If it is in its later stages, treatment depends on the location and extent of tooth destruction:  °· If a small area of the tooth has been destroyed, the destroyed area will be removed and cavities will be filled with a material such as gold, silver amalgam, or composite resin.   °· If a large area of the tooth has been destroyed, the destroyed area will be removed and a cap (crown) will be fitted over the remaining tooth structure.   °· If the center part of the tooth (pulp) is affected, a procedure called a root canal will be needed before a filling or crown can be placed.   °· If most of the tooth has been destroyed, the tooth may need to be pulled (extracted). °HOME CARE INSTRUCTIONS °You can prevent, stop, or reverse dental caries at home by practicing good oral hygiene. Good oral hygiene includes: °· Thoroughly cleaning your teeth at least twice a day with a toothbrush and dental floss.   °· Using a fluoride toothpaste. A fluoride mouth rinse may also be used if recommended by your dentist or health care provider.   °· Restricting the amount of sugary and starchy foods and sugary liquids you consume.   °· Avoiding frequent snacking on these foods and sipping of these liquids.   °· Keeping regular visits with   a dentist for checkups and cleanings. °PREVENTION  °· Practice good oral hygiene. °· Consider a dental sealant. A dental sealant is a coating material that is applied by your dentist to the pits and grooves of teeth. The sealant prevents food from being trapped in them. It may protect the teeth for several years. °· Ask about fluoride supplements if you live in a community without fluorinated water or with water that has a low fluoride content. Use fluoride supplements  as directed by your dentist or health care provider. °· Allow fluoride varnish applications to teeth if directed by your dentist or health care provider. °Document Released: 07/26/2002 Document Revised: 03/20/2014 Document Reviewed: 11/05/2012 °ExitCare® Patient Information ©2015 [employer]. This information is not intended to replace advice given to you by your health care provider. Make sure you discuss any questions you have with your health care provider. ° °Emergency Department Resource Guide °1) Find a Doctor and Pay Out of Pocket °Although you won't have to find out who is covered by your insurance plan, it is a good idea to ask around and get recommendations. You will then need to call the office and see if the doctor you have chosen will accept you as a new patient and what types of options they offer for patients who are self-pay. Some doctors offer discounts or will set up payment plans for their patients who do not have insurance, but you will need to ask so you aren't surprised when you get to your appointment. ° °2) Contact Your Local Health Department °Not all health departments have doctors that can see patients for sick visits, but many do, so it is worth a call to see if yours does. If you don't know where your local health department is, you can check in your phone book. The CDC also has a tool to help you locate your state's health department, and many state websites also have listings of all of their local health departments. ° °3) Find a Walk-in Clinic °If your illness is not likely to be very severe or complicated, you may want to try a walk in clinic. These are popping up all over the country in pharmacies, drugstores, and shopping centers. They're usually staffed by nurse practitioners or physician assistants that have been trained to treat common illnesses and complaints. They're usually fairly quick and inexpensive. However, if you have serious medical issues or chronic medical  problems, these are probably not your best option. ° °No Primary Care Doctor: °- Call [facility] at  832-8000 - they can help you locate a primary care doctor that  accepts your insurance, provides certain services, etc. °- Physician Referral Service- 1-800-533-3463 ° °Chronic Pain Problems: °Organization         Address  Phone   Notes  °[facility]  (336) 297-2271 Patients need to be referred by their primary care doctor.  ° °Medication Assistance: °Organization         Address  Phone   Notes  °[place] [facility] [address] °[place], [place] [address] (336) 641-8030 --Must be a resident of [place] °-- Must have NO insurance coverage whatsoever (no Medicaid/ Medicare, etc.) °-- The pt. MUST have a primary care doctor that directs their care regularly and follows them in the community °  °[facility]  (866) 331-1348   °[employer]  (888) 892-1162   ° °Agencies that provide inexpensive medical care: °Organization           Address  Phone   Notes  °[facility]  (336) 832-8035   °[facility]    (336) 832-7272   °[facility] [address] °[place], [place] [address] (336) 832-4777   °[facility] [address], °[place] (336) 271-4999   °[facility]    (336) 373-0678   °[facility]    (336) 272-1050   °[facility] ° [address], [place] Phone:  (336) 832-4444, Fax:  (336) 832-4440 Hours of Operation:  9 am - 6 pm, M-F.  Also accepts Medicaid/Medicare and self-pay.  °[facility] ° [address], [place] Phone: (336) 832-3150, Fax: (336) 832-3151. Hours of Operation:  8:30 am - 5:30 pm, M-F.  Also accepts Medicaid and self-pay.  °[facility] [place] [address], [place] Phone: (336) 878-6027   °[facility] [address], [place], [place] (336)723-1848, Ext. 123  Mondays & Thursdays: 7-9 AM.  First 15 patients are seen on a first come, first serve basis. °  ° °Medicaid-accepting [place] Providers: ° °Organization         Address  Phone   Notes  °[facility] [address], [place] (336) 641-2100 Also accepts self-pay patients.  °[facility] [address], [place] ° (336) 856-9996   °[facility] [address], [place] (336) 288-8857   °[facility] [address] [place] [address], [place] (336) 299-7000   °Veita Bland [address], [place]  ° (336) 373-1557 Only accepts [place] Access Medicaid patients after they have their name applied to their card.  ° °Self-Pay (no insurance) in [place]: ° °Organization         Address  Phone   Notes  °Sickle Cell Patients, [facility] [address], [place] (336) 832-1970   °[facility] [address], [place] (336) 832-4400   °[facility] [place] ° [address], [place] (336) 992-4800   °[facility]/Dr. Osei-Bonsu ° [address] [place] [address], [place] or [address], [place] (336) 841-8500 Phone number for both [place] and [place] locations is the same.  °[facility] [address], [place] (336) 299-0000   °[facility] [address] [place] [address], [place] or [address] (336) 852-7530 °(336) 878-2260   °[facility] [address],  (336) 350-1642, phone; (336) 294-5005, fax Sees patients 1st and 3rd Saturday of every month.  Must not qualify for public or private insurance (i.e. Medicaid, Medicare, [place] Health Choice, Veterans' Benefits) • Household income should be no more than 200% of the poverty level •The clinic cannot treat you if you are pregnant or think you are pregnant • Sexually transmitted diseases are not treated at  the clinic.  ° ° °Dental Care: °Organization         Address  Phone  Notes  °[facility] [address],  (336) 641-6152 Accepts children up to age 21 who are enrolled in Medicaid or [place] Health Choice; pregnant women with a Medicaid card; and children who have applied for Medicaid or  Health Choice, but were declined, whose parents can pay a reduced fee at time   of service.  °[facility] [place]  [address], [place] (336) 641-7733 Accepts children up to age 21 who are enrolled in Medicaid or [place] Health Choice; pregnant women with a Medicaid card; and children who have applied for Medicaid or [place] Health Choice, but were declined, whose parents can pay a reduced fee at time of service.  °[facility] ° [address], [place] (336) 641-4533 Patients are seen by appointment only. Walk-ins are not accepted. [facility] will see patients 18 years of age and older. °Monday - Tuesday (8am-5pm) °Most Wednesdays (8:30-5pm) °$30 per visit, cash only  °[facility] ° [address], [place] (336) 641-4533 Patients are seen by appointment only. Walk-ins are not accepted. [facility] will see patients 18 years of age and older. °One Wednesday Evening (Monthly: Volunteer Based).  $30 per visit, cash only  °[facility]  (919) 537-3737 for adults; Children under age 4, call Graduate Pediatric Dentistry at ([phone]) [phone]. Children aged 4-14, please call (919) [phone] to request a pediatric application. ° Dental services are provided in all areas of dental care including fillings, crowns and bridges, complete and partial dentures, implants, gum treatment, root canals, and extractions. Preventive care is also provided. Treatment is provided to both adults and children. °Patients are selected via a lottery and there is often a  waiting list. °  °[facility] [address], °[place] ° (336) 763-8833 [email or web address].[email or web address] °  °[facility] [address], [place], [place] (336)723-1848, Ext. 123 Second and Fourth Thursday of each month, opens at 6:30 AM; Clinic ends at 9 AM.  Patients are seen on a first-come first-served basis, and a limited number are seen during each clinic.  ° °[facility] ° [address], [place], [place] (336) 723-7904   Eligibility Requirements °You must have lived in [place], [place], or [place] counties for at least the last three months. °  You cannot be eligible for state or federal sponsored healthcare insurance, including Veterans Administration, Medicaid, or Medicare. °  You generally cannot be eligible for healthcare insurance through your employer.  °  How to apply: °Eligibility screenings are held every Tuesday and Wednesday afternoon from 1:00 pm until 4:00 pm. You do not need an appointment for the interview!  °[facility] [address], [place], [place] 336-631-2330   °[facility]  336-342-8273   °[facility]  336-703-3100   °[facility]  336-570-6415   ° °Behavioral Health Resources in the Community: °Intensive Outpatient Programs °Organization         Address  Phone  Notes  °[facility] [address], [place], [place] 336-878-6098   °[facility] [address], [place], [place] 336-832-9800   °[facility] [address], [place], [place] ° 336-882-2125   °[facility] [address],  °[place], [place] 1-800-853-5163 or 336-641-4981   °Substance Abuse Resources °Organization         Address  Phone  Notes  °[facility]  336-882-2125   °[facility]  336-784-9470   °[facility]  336-285-9073   °[facility]  336-845-3988   °Residential & Outpatient Substance Abuse  Program  1-800-659-3381   °Psychological Services °Organization         Address    Phone  Notes  °[facility]  336- 832-9600   °[facility]  336- 378-7881   °[facility] [address], [place] 1-800-853-5163 or 336-641-4981   ° °Mobile Crisis Teams °Organization         Address  Phone  Notes  °[facility], Mobile Crisis Care Unit  1-877-626-1772   °Assertive °[facility] ° [address] [place], [place] 336-834-9664   °Sharon DeEsch [address] °[place] 336-554-5454   ° °Self-Help/Support Groups °Organization         Address  Phone             Notes  °[facility] - variety of support groups  336- 373-1402 Call for more information  °Narcotics Anonymous (NA), [facility] [address], °[place]  2 meetings at this location  ° °Residential Treatment Programs °Organization         Address  Phone  Notes  °[facility] [address],    °[place]  1-866-801-8205   °[facility] ° [address], [place], [place] 704-293-8524   °[facility] [address], [place] 336-845-3988 Admissions: 8am-3pm M-F  °[facility] [address]    °[place], [place] 336-841-1104   °[facility] [address], [place], [place] 336-379-7146   °[facility] [address]  °[place], [place] 336-285-9073   °[facility] - Intensive Outpatient [address], [place], [place] 336-852-3033   °[facility] [address]  °[place], [place] 1-877-615-2722 or 336-784-9470   °[facility] [address] [place], [place] 336-227-7417 Accepts Medicaid  °[facility] [address]  ° [place] 1-800-659-3381 Substance Abuse/Addiction Treatment  ° °[place] Behavioral Health Resources °Organization          Address  Phone  Notes  °[facility]  (888) 581-9988   °Julie Brannon, PhD [address] [place], [place]   (336) 349-5553 or (336) 951-0000   °[facility]   [address] °[place], [place] (336) 349-4454   °[facility] [address], [place], [place] (336) 342-8316 Insurance/Medicaid/sponsorship through [facility]  °[facility] [address]                                    [place], [place] (336) 342-8316 Therapy/tele-psych/case  °[facility] [address]  ° [place], [place] (336) 349-2233    °Dr. Arfeen  (336) 349-4544   °[facility] [place]  [employer] [place] [facility] 1) [address], [place] °2) [address], [place] °3)  [address], [place] (336) 349-3220 °(336) 342-7768 ° °(336) 342-8140   °[place] [facility] (336) 342-1394 or (336) 342-3537 (After Hours)    ° ° ° °

## 2014-10-05 NOTE — ED Notes (Signed)
Pt c/o rt upper, lt lower toothache x1wk worse tonight, rt ear ache

## 2014-10-08 ENCOUNTER — Emergency Department (HOSPITAL_BASED_OUTPATIENT_CLINIC_OR_DEPARTMENT_OTHER)
Admission: EM | Admit: 2014-10-08 | Discharge: 2014-10-08 | Disposition: A | Discharge status: Home / Self Care | Payer: No Typology Code available for payment source | Attending: Emergency Medicine | Admitting: Emergency Medicine

## 2014-10-08 ENCOUNTER — Encounter (HOSPITAL_BASED_OUTPATIENT_CLINIC_OR_DEPARTMENT_OTHER): Payer: Self-pay

## 2014-10-08 DIAGNOSIS — Z880 Allergy status to penicillin: Secondary | ICD-10-CM | POA: Insufficient documentation

## 2014-10-08 DIAGNOSIS — K088 Other specified disorders of teeth and supporting structures: Secondary | ICD-10-CM | POA: Insufficient documentation

## 2014-10-08 DIAGNOSIS — Z720 Tobacco use: Secondary | ICD-10-CM | POA: Insufficient documentation

## 2014-10-08 DIAGNOSIS — Z792 Long term (current) use of antibiotics: Secondary | ICD-10-CM | POA: Insufficient documentation

## 2014-10-08 DIAGNOSIS — Z79899 Other long term (current) drug therapy: Secondary | ICD-10-CM | POA: Insufficient documentation

## 2014-10-08 DIAGNOSIS — K029 Dental caries, unspecified: Secondary | ICD-10-CM | POA: Insufficient documentation

## 2014-10-08 MED ORDER — OXYCODONE-ACETAMINOPHEN 5-325 MG PO TABS
1.0000 | ORAL_TABLET | Freq: Once | ORAL | Status: AC
  Administered 2014-10-08: 1 via ORAL
  Filled 2014-10-08: qty 1

## 2014-10-08 NOTE — ED Provider Notes (Signed)
CSN: [id number]     Arrival date & time 10/08/14  2101 History  This chart was scribed for No att. providers found by Peyton Bottoms, ED Scribe. This patient was seen in room MHFT1/MHFT1 and the patient's care was started at 11:03 PM.   Chief Complaint  Patient presents with  . Dental Pain   Patient is a 38 y.o. female presenting with tooth pain. The history is provided by the patient. No language interpreter was used.  Dental Pain Location:  Lower Quality:  Aching and constant Severity:  Moderate Onset quality:  Gradual Timing:  Constant Chronicity:  Recurrent Relieved by:  Nothing Worsened by:  Nothing tried Ineffective treatments:  None tried Associated symptoms: no fever    HPI Comments: Christine Campbell is a 37 y.o. female who presents to the Emergency Department complaining of left lower dental pain. Patient reports for the past week she has been having sharp throbbing pain to her left lower tooth and her right upper tooth. Pain is persistent, worsening with chewing. She denies any fever, chills, trouble swallowing, neck pain, or rash. Denies any runny nose, sneezing or coughing. She was seen in the ED 3 days ago for the same complaint and was prescribed clindamycin, ibuprofen, and Percocet. Patient was also given a referral to a dentist. Patient states she tries calling a dentist but unable to get in touch with him. She tries calling again but states that she has to be seen in the ED again for another referral.  Past Medical History  Diagnosis Date  . Migraine   . Seizures   . Brain injury   . Diabetes mellitus without complication   . Asthma   . Bronchitis, chronic    Past Surgical History  Procedure Laterality Date  . Tubal ligation    . Traumatic brain injury    . Video bronchoscopy Bilateral 11/23/2013    Procedure: VIDEO BRONCHOSCOPY WITH FLUORO;  Surgeon: Rigoberto Noel, MD;  Location: [facility];  Service: Cardiopulmonary;  Laterality: Bilateral;   No family  history on file. History  Substance Use Topics  . Smoking status: Current Every Day Smoker -- 24 years  . Smokeless tobacco: Never Used  . Alcohol Use: No   OB History    No data available     Review of Systems  Constitutional: Negative for fever.  HENT: Positive for dental problem.    Allergies  Cephalosporins; Penicillins; Erythromycin; Tramadol; and Vicodin  Home Medications   Prior to Admission medications   Medication Sig Start Date End Date Taking? Authorizing Provider  albuterol (PROVENTIL HFA;VENTOLIN HFA) 108 (90 BASE) MCG/ACT inhaler Inhale 2 puffs into the lungs every 4 (four) hours as needed for wheezing or shortness of breath. 11/24/13  Yes Shanker Kristeen Mans, MD  gabapentin (NEURONTIN) 300 MG capsule Take 300 mg by mouth 3 (three) times daily.   Yes Historical Provider, MD  phenytoin (DILANTIN) 100 MG ER capsule Take 2 capsules (200 mg total) by mouth 2 (two) times daily. 11/24/13  Yes Shanker Kristeen Mans, MD  Tiotropium Bromide Monohydrate (SPIRIVA RESPIMAT) 2.5 MCG/ACT AERS Inhale 2 puffs into the lungs daily. 08/30/14  Yes Rigoberto Noel, MD  clindamycin (CLEOCIN) 150 MG capsule Take 2 capsules (300 mg total) by mouth 3 (three) times daily. 10/05/14   Merryl Hacker, MD  ibuprofen (ADVIL,MOTRIN) 600 MG tablet Take 1 tablet (600 mg total) by mouth every 6 (six) hours as needed. 10/05/14   Merryl Hacker, MD  oxyCODONE-acetaminophen (PERCOCET/ROXICET) [phone]  MG per tablet Take 1 tablet by mouth every 8 (eight) hours as needed for severe pain. 09/27/14   Carmin Muskrat, MD  oxyCODONE-acetaminophen (PERCOCET/ROXICET) 5-325 MG per tablet Take 1 tablet by mouth every 6 (six) hours as needed for severe pain. 10/05/14   Merryl Hacker, MD   Triage Vitals: BP 156/91 mmHg  Pulse 73  Temp(Src) 98.2 F (36.8 C) (Oral)  Resp 20  Ht 5\' 1"  (1.549 m)  Wt 150 lb (68.04 kg)  BMI 28.36 kg/m2  SpO2 97%  LMP 09/09/2014  Physical Exam  Constitutional: She appears well-developed  and well-nourished. No distress.  HENT:  Head: Atraumatic.  Right Ear: External ear normal.  Left Ear: External ear normal.  Nose: Nose normal.  Poor dentition throughout, tenderness to tooth #6 with dental decay but no obvious abscess. Tenderness to tooth #19 with dental decay but no abscess. No trismus. No gingivitis.  No trismus  Eyes: Conjunctivae are normal.  Neck: Neck supple.  Neurological: She is alert.  Skin: No rash noted.  Psychiatric: She has a normal mood and affect.  Nursing note and vitals reviewed.   ED Course  Procedures (including critical care time)  DIAGNOSTIC STUDIES: Oxygen Saturation is 97% on RA, normal by my interpretation.    COORDINATION OF CARE: 11:02 PM- Pt advised of plan for treatment and pt agrees.  Patient has been seen in the ED multiple times for dental pain. Most recent visit was 3 days ago when she was prescribed antibiotic pain medication and a dental referral. She has no obvious evidence of abscess on exam and no concerning finding. We'll treat her pain here in the ER only and we'll give her a referral to dentist with strong recommendation to follow-up tomorrow for further care.  Labs Review Labs Reviewed - No data to display  Imaging Review No results found.   EKG Interpretation None     MDM   Final diagnoses:  Pain due to dental caries   BP 156/91 mmHg  Pulse 73  Temp(Src) 98.2 F (36.8 C) (Oral)  Resp 20  Ht 5\' 1"  (1.549 m)  Wt 150 lb (68.04 kg)  BMI 28.36 kg/m2  SpO2 97%  LMP 09/09/2014   I personally performed the services described in this documentation, which was scribed in my presence. The recorded information has been reviewed and is accurate.  Domenic Moras, PA-C 10/08/14 2319  April K Palumbo-Rasch, MD 10/08/14 2333

## 2014-10-08 NOTE — Discharge Instructions (Signed)
Dental Caries Dental caries is tooth decay. This decay can cause a hole in teeth (cavity) that can get bigger and deeper over time. HOME CARE  Brush and floss your teeth. Do this at least two times a day.  Use a fluoride toothpaste.  Use a mouth rinse if told by your dentist or doctor.  Eat less sugary and starchy foods. Drink less sugary drinks.  Avoid snacking often on sugary and starchy foods. Avoid sipping often on sugary drinks.  Keep regular checkups and cleanings with your dentist.  Use fluoride supplements if told by your dentist or doctor.  Allow fluoride to be applied to teeth if told by your dentist or doctor. Document Released: 08/12/2008 Document Revised: 03/20/2014 Document Reviewed: 11/05/2012 [facility] Patient Information 2015 [place], [place]. This information is not intended to replace advice given to you by your health care provider. Make sure you discuss any questions you have with your health care provider.

## 2014-10-08 NOTE — ED Notes (Signed)
Pt reports left lower back dental pain - reports poor dental hygiene with broken tooth noted.

## 2014-10-16 ENCOUNTER — Ambulatory Visit: Payer: Self-pay | Admitting: Pulmonary Disease

## 2014-11-17 ENCOUNTER — Emergency Department (HOSPITAL_BASED_OUTPATIENT_CLINIC_OR_DEPARTMENT_OTHER)
Admission: EM | Admit: 2014-11-17 | Discharge: 2014-11-17 | Disposition: A | Discharge status: Home / Self Care | Payer: No Typology Code available for payment source | Attending: Emergency Medicine | Admitting: Emergency Medicine

## 2014-11-17 ENCOUNTER — Encounter (HOSPITAL_BASED_OUTPATIENT_CLINIC_OR_DEPARTMENT_OTHER): Payer: Self-pay | Admitting: *Deleted

## 2014-11-17 DIAGNOSIS — Z87820 Personal history of traumatic brain injury: Secondary | ICD-10-CM | POA: Insufficient documentation

## 2014-11-17 DIAGNOSIS — Z720 Tobacco use: Secondary | ICD-10-CM | POA: Insufficient documentation

## 2014-11-17 DIAGNOSIS — G43909 Migraine, unspecified, not intractable, without status migrainosus: Secondary | ICD-10-CM | POA: Insufficient documentation

## 2014-11-17 DIAGNOSIS — Z792 Long term (current) use of antibiotics: Secondary | ICD-10-CM | POA: Insufficient documentation

## 2014-11-17 DIAGNOSIS — G40909 Epilepsy, unspecified, not intractable, without status epilepticus: Secondary | ICD-10-CM | POA: Insufficient documentation

## 2014-11-17 DIAGNOSIS — N938 Other specified abnormal uterine and vaginal bleeding: Secondary | ICD-10-CM | POA: Insufficient documentation

## 2014-11-17 DIAGNOSIS — Z3202 Encounter for pregnancy test, result negative: Secondary | ICD-10-CM | POA: Insufficient documentation

## 2014-11-17 DIAGNOSIS — R102 Pelvic and perineal pain: Secondary | ICD-10-CM | POA: Insufficient documentation

## 2014-11-17 DIAGNOSIS — J45909 Unspecified asthma, uncomplicated: Secondary | ICD-10-CM | POA: Insufficient documentation

## 2014-11-17 DIAGNOSIS — Z880 Allergy status to penicillin: Secondary | ICD-10-CM | POA: Insufficient documentation

## 2014-11-17 DIAGNOSIS — R109 Unspecified abdominal pain: Secondary | ICD-10-CM

## 2014-11-17 DIAGNOSIS — E119 Type 2 diabetes mellitus without complications: Secondary | ICD-10-CM | POA: Insufficient documentation

## 2014-11-17 DIAGNOSIS — Z79899 Other long term (current) drug therapy: Secondary | ICD-10-CM | POA: Insufficient documentation

## 2014-11-17 LAB — BASIC METABOLIC PANEL
Anion gap: 10 (ref 5–15)
BUN: 11 mg/dL (ref 6–23)
CO2: 26 mmol/L (ref 19–32)
Calcium: 9.2 mg/dL (ref 8.4–10.5)
Chloride: 103 mEq/L (ref 96–112)
Creatinine, Ser: 0.9 mg/dL (ref 0.50–1.10)
GFR calc Af Amer: >90 mL/min (ref >90)
GFR calc non Af Amer: 81 mL/min — ABNORMAL LOW (ref >90)
Glucose, Bld: 99 mg/dL (ref 70–99)
Potassium: 3.5 mmol/L (ref 3.5–5.1)
Sodium: 139 mmol/L (ref 135–145)

## 2014-11-17 LAB — URINALYSIS, ROUTINE W REFLEX MICROSCOPIC
Bilirubin Urine: NEGATIVE
Glucose, UA: NEGATIVE mg/dL
Ketones, ur: NEGATIVE mg/dL
Leukocytes, UA: NEGATIVE
Nitrite: NEGATIVE
Protein, ur: NEGATIVE mg/dL
Specific Gravity, Urine: 1.028 (ref 1.005–1.030)
Urobilinogen, UA: 0.2 mg/dL (ref 0.0–1.0)
pH: 5.5 (ref 5.0–8.0)

## 2014-11-17 LAB — URINE MICROSCOPIC-ADD ON

## 2014-11-17 LAB — CBC WITH DIFFERENTIAL/PLATELET
BASOS ABS: 0 10*3/uL (ref 0.0–0.1)
BASOS PCT: 0 % (ref 0–1)
EOS ABS: 0.5 10*3/uL (ref 0.0–0.7)
EOS PCT: 4 % (ref 0–5)
HCT: 39.6 % (ref 36.0–46.0)
Hemoglobin: 13.1 g/dL (ref 12.0–15.0)
LYMPHS ABS: 4.2 10*3/uL — AB (ref 0.7–4.0)
LYMPHS PCT: 32 % (ref 12–46)
MCH: 31.3 pg (ref 26.0–34.0)
MCHC: 33.1 g/dL (ref 30.0–36.0)
MCV: 94.5 fL (ref 78.0–100.0)
MONO ABS: 1 10*3/uL (ref 0.1–1.0)
MONOS PCT: 7 % (ref 3–12)
NEUTROS ABS: 7.4 10*3/uL (ref 1.7–7.7)
Neutrophils Relative %: 57 % (ref 43–77)
Platelets: 357 10*3/uL (ref 150–400)
RBC: 4.19 MIL/uL (ref 3.87–5.11)
RDW: 13.4 % (ref 11.5–15.5)
WBC: 13 10*3/uL — ABNORMAL HIGH (ref 4.0–10.5)

## 2014-11-17 LAB — PREGNANCY, URINE: PREG TEST UR: NEGATIVE

## 2014-11-17 MED ORDER — OXYCODONE-ACETAMINOPHEN 5-325 MG PO TABS: 1.0000 | ORAL_TABLET | ORAL | Status: DC | PRN

## 2014-11-17 MED ORDER — ONDANSETRON HCL 4 MG/2ML IJ SOLN
4.0000 mg | Freq: Once | INTRAMUSCULAR | Status: AC
  Administered 2014-11-17: 4 mg via INTRAVENOUS
  Filled 2014-11-17: qty 2

## 2014-11-17 MED ORDER — HYDROMORPHONE HCL 1 MG/ML IJ SOLN
1.0000 mg | Freq: Once | INTRAMUSCULAR | Status: AC
  Administered 2014-11-17: 1 mg via INTRAVENOUS
  Filled 2014-11-17: qty 1

## 2014-11-17 NOTE — ED Provider Notes (Signed)
CSN: [id number]     Arrival date & time 11/17/14  2033 History  This chart was scribed for Christine Johns, MD by Delphia Grates, ED Scribe. This patient was seen in room MH11/MH11 and the patient's care was started at 9:08 PM.   Chief Complaint  Patient presents with  . Abdominal Pain    The history is provided by the patient. No language interpreter was used.     HPI Comments: Christine Campbell is a 39 y.o. female, with history of seizures, DM, and asthma, who presents to the Emergency Department complaining of persistent vaginal bleeding fro the past 1 month. Patient states she has not had a normal period for 2 months and reports this bleeding is different from her menstrual periods. She describes her current vaginal bleeding as "brighter red" without odor and notes she is also passing clots. Patient states the bleeding has only stopped for one day since onset. There is associated moderate left side abdominal pain, nausea, vomiting, decreased appetite, and lower back pain.  Patient has taken Percocet without relief. She denies histoty of fibroids or ovarian cysts. She also denies fever, chills, HA, dizziness, chest pain, SOB, or any urinary symptoms. Patient states she is uninsured and not established with an OB/GYN.    Past Medical History  Diagnosis Date  . Migraine   . Seizures   . Brain injury   . Diabetes mellitus without complication   . Asthma   . Bronchitis, chronic    Past Surgical History  Procedure Laterality Date  . Tubal ligation    . Traumatic brain injury    . Video bronchoscopy Bilateral 11/23/2013    Procedure: VIDEO BRONCHOSCOPY WITH FLUORO;  Surgeon: Rigoberto Noel, MD;  Location: [place];  Service: Cardiopulmonary;  Laterality: Bilateral;   No family history on file. History  Substance Use Topics  . Smoking status: Current Every Day Smoker -- 24 years  . Smokeless tobacco: Never Used  . Alcohol Use: No   OB History    No data available     Review of  Systems  Constitutional: Positive for appetite change (decreased). Negative for fever, chills, diaphoresis and fatigue.  HENT: Negative for congestion, rhinorrhea and sneezing.   Eyes: Negative.   Respiratory: Negative for cough, chest tightness and shortness of breath.   Cardiovascular: Negative for chest pain and leg swelling.  Gastrointestinal: Positive for nausea, vomiting and abdominal pain. Negative for diarrhea and blood in stool.  Genitourinary: Positive for vaginal bleeding and pelvic pain. Negative for frequency, hematuria, flank pain, vaginal discharge and difficulty urinating.  Musculoskeletal: Negative for arthralgias.  Skin: Negative for rash.  Neurological: Negative for dizziness, speech difficulty, weakness, numbness and headaches.      Allergies  Cephalosporins; Penicillins; Erythromycin; Tramadol; and Vicodin  Home Medications   Prior to Admission medications   Medication Sig Start Date End Date Taking? Authorizing Provider  albuterol (PROVENTIL HFA;VENTOLIN HFA) 108 (90 BASE) MCG/ACT inhaler Inhale 2 puffs into the lungs every 4 (four) hours as needed for wheezing or shortness of breath. 11/24/13   Shanker Kristeen Mans, MD  clindamycin (CLEOCIN) 150 MG capsule Take 2 capsules (300 mg total) by mouth 3 (three) times daily. 10/05/14   Merryl Hacker, MD  gabapentin (NEURONTIN) 300 MG capsule Take 300 mg by mouth 3 (three) times daily.    Historical Provider, MD  ibuprofen (ADVIL,MOTRIN) 600 MG tablet Take 1 tablet (600 mg total) by mouth every 6 (six) hours as needed. 10/05/14   Loma Sousa  F Horton, MD  oxyCODONE-acetaminophen (PERCOCET) 5-325 MG per tablet Take 1-2 tablets by mouth every 4 (four) hours as needed. 11/17/14   Christine Johns, MD  phenytoin (DILANTIN) 100 MG ER capsule Take 2 capsules (200 mg total) by mouth 2 (two) times daily. 11/24/13   Shanker Kristeen Mans, MD  Tiotropium Bromide Monohydrate (SPIRIVA RESPIMAT) 2.5 MCG/ACT AERS Inhale 2 puffs into the lungs daily.  08/30/14   Rigoberto Noel, MD   Triage Vitals: BP 136/84 mmHg  Pulse 92  Temp(Src) 98 F (36.7 C) (Oral)  Resp 20  Ht 5\' 1"  (1.549 m)  Wt 157 lb 3.2 oz (71.305 kg)  BMI 29.72 kg/m2  SpO2 98%  LMP  (LMP Unknown)  Physical Exam  Constitutional: She is oriented to person, place, and time. She appears well-developed and well-nourished.  HENT:  Head: Normocephalic and atraumatic.  Eyes: Pupils are equal, round, and reactive to light.  Neck: Normal range of motion. Neck supple.  Cardiovascular: Normal rate, regular rhythm and normal heart sounds.   Pulmonary/Chest: Effort normal and breath sounds normal. No respiratory distress. She has no wheezes. She has no rales. She exhibits no tenderness.  Abdominal: Soft. Bowel sounds are normal. There is tenderness (moderate TTP left pelvic area). There is no rebound and no guarding.  Genitourinary:  Small amount of dark blood in vault, no active bleeding.  No CMT.  No discharge  Musculoskeletal: Normal range of motion. She exhibits no edema.  Lymphadenopathy:    She has no cervical adenopathy.  Neurological: She is alert and oriented to person, place, and time.  Skin: Skin is warm and dry. No rash noted.  Psychiatric: She has a normal mood and affect.    ED Course  Procedures (including critical care time)  DIAGNOSTIC STUDIES: Oxygen Saturation is 98% on room air, normal by my interpretation.    COORDINATION OF CARE: At 2111 Discussed treatment plan with patient which includes pelvic exam and [place]. Patient agrees.   Labs Review Results for orders placed or performed during the hospital encounter of 11/17/14  Pregnancy, urine  Result Value Ref Range   Preg Test, Ur NEGATIVE NEGATIVE  Urinalysis, Routine w reflex microscopic  Result Value Ref Range   Color, Urine YELLOW YELLOW   APPearance CLEAR CLEAR   Specific Gravity, Urine 1.028 1.005 - 1.030   pH 5.5 5.0 - 8.0   Glucose, UA NEGATIVE NEGATIVE mg/dL   Hgb urine dipstick SMALL (A)  NEGATIVE   Bilirubin Urine NEGATIVE NEGATIVE   Ketones, ur NEGATIVE NEGATIVE mg/dL   Protein, ur NEGATIVE NEGATIVE mg/dL   Urobilinogen, UA 0.2 0.0 - 1.0 mg/dL   Nitrite NEGATIVE NEGATIVE   Leukocytes, UA NEGATIVE NEGATIVE  CBC with Differential  Result Value Ref Range   WBC 13.0 (H) 4.0 - 10.5 K/uL   RBC 4.19 3.87 - 5.11 MIL/uL   Hemoglobin 13.1 12.0 - 15.0 g/dL   HCT 39.6 36.0 - 46.0 %   MCV 94.5 78.0 - 100.0 fL   MCH 31.3 26.0 - 34.0 pg   MCHC 33.1 30.0 - 36.0 g/dL   RDW 13.4 11.5 - 15.5 %   Platelets 357 150 - 400 K/uL   Neutrophils Relative % 57 43 - 77 %   Neutro Abs 7.4 1.7 - 7.7 K/uL   Lymphocytes Relative 32 12 - 46 %   Lymphs Abs 4.2 (H) 0.7 - 4.0 K/uL   Monocytes Relative 7 3 - 12 %   Monocytes Absolute 1.0 0.1 - 1.0  K/uL   Eosinophils Relative 4 0 - 5 %   Eosinophils Absolute 0.5 0.0 - 0.7 K/uL   Basophils Relative 0 0 - 1 %   Basophils Absolute 0.0 0.0 - 0.1 K/uL  Basic metabolic panel  Result Value Ref Range   Sodium 139 135 - 145 mmol/L   Potassium 3.5 3.5 - 5.1 mmol/L   Chloride 103 96 - 112 mEq/L   CO2 26 19 - 32 mmol/L   Glucose, Bld 99 70 - 99 mg/dL   BUN 11 6 - 23 mg/dL   Creatinine, Ser 0.90 0.50 - 1.10 mg/dL   Calcium 9.2 8.4 - 10.5 mg/dL   GFR calc non Af Amer 81 (L) >90 mL/min   GFR calc Af Amer >90 >90 mL/min   Anion gap 10 5 - 15  Urine microscopic-add on  Result Value Ref Range   Squamous Epithelial / LPF RARE RARE   RBC / HPF 3-6 <3 RBC/hpf   Bacteria, UA RARE RARE   No results found.    Imaging Review No results found.   EKG Interpretation None      MDM   Final diagnoses:  Left sided abdominal pain  Dysfunctional uterine bleeding    Were unable to do a pelvic ultrasound tonight. I ordered one for tomorrow. Her pelvic pain is been constant for the last week or so it is not consistent with ovarian torsion. Ultrasound is more to evaluate for mass or fibroid. I gave her referral to follow-up with [facility].  She's given prescription for Percocet for pain which she is taking in the past and had no allergies to. Her hemoglobin is stable.  I personally performed the services described in this documentation, which was scribed in my presence.  The recorded information has been reviewed and considered.    Christine Johns, MD 11/17/14 [phone]

## 2014-11-17 NOTE — Discharge Instructions (Signed)
Abnormal Uterine Bleeding Abnormal uterine bleeding can affect women at various stages in life, including teenagers, women in their reproductive years, pregnant women, and women who have reached menopause. Several kinds of uterine bleeding are considered abnormal, including:  Bleeding or spotting between periods.   Bleeding after sexual intercourse.   Bleeding that is heavier or more than normal.   Periods that last longer than usual.  Bleeding after menopause.  Many cases of abnormal uterine bleeding are minor and simple to treat, while others are more serious. Any type of abnormal bleeding should be evaluated by your health care provider. Treatment will depend on the cause of the bleeding. HOME CARE INSTRUCTIONS Monitor your condition for any changes. The following actions may help to alleviate any discomfort you are experiencing:  Avoid the use of tampons and douches as directed by your health care provider.  Change your pads frequently. You should get regular pelvic exams and Pap tests. Keep all follow-up appointments for diagnostic tests as directed by your health care provider.  SEEK MEDICAL CARE IF:   Your bleeding lasts more than 1 week.   You feel dizzy at times.  SEEK IMMEDIATE MEDICAL CARE IF:   You pass out.   You are changing pads every 15 to 30 minutes.   You have abdominal pain.  You have a fever.   You become sweaty or weak.   You are passing large blood clots from the vagina.   You start to feel nauseous and vomit. MAKE SURE YOU:   Understand these instructions.  Will watch your condition.  Will get help right away if you are not doing well or get worse. Document Released: 11/03/2005 Document Revised: 11/08/2013 Document Reviewed: 06/02/2013 Exit[employer] Patient Information 2015 [employer]. This information is not intended to replace advice given to you by your health care provider. Make sure you discuss any questions you have with your  health care provider.  

## 2014-11-17 NOTE — ED Notes (Signed)
C/o lower abd pain, L side pain and lower back pain and bleeding x1 month. Mentions some nausea, indigestion and vomiting although not recently, (denies: diarrhea, dizzines, sob, urinary sx or vaginal sx or fever), last emesis yesterday. Last BM yesterday (normal), last ate 1830.took percocet 10/325 w/o relief.  Now mentions some urinary frequency.

## 2014-11-18 ENCOUNTER — Ambulatory Visit (HOSPITAL_BASED_OUTPATIENT_CLINIC_OR_DEPARTMENT_OTHER)
Admission: RE | Admit: 2014-11-18 | Discharge: 2014-11-18 | Disposition: A | Discharge status: Home / Self Care | Payer: PRIVATE HEALTH INSURANCE | Source: Ambulatory Visit | Attending: Emergency Medicine | Admitting: Emergency Medicine

## 2014-11-18 ENCOUNTER — Other Ambulatory Visit (HOSPITAL_BASED_OUTPATIENT_CLINIC_OR_DEPARTMENT_OTHER): Payer: Self-pay | Admitting: Emergency Medicine

## 2014-11-18 DIAGNOSIS — D252 Subserosal leiomyoma of uterus: Secondary | ICD-10-CM | POA: Insufficient documentation

## 2014-11-18 DIAGNOSIS — R52 Pain, unspecified: Secondary | ICD-10-CM

## 2014-11-18 NOTE — ED Provider Notes (Signed)
Discussed with patient findings of ultrasound report with small fibroid and need for OB/GYN follow-up.  Christine Reichert, MD 11/18/14 [phone]

## 2014-11-19 ENCOUNTER — Encounter (HOSPITAL_COMMUNITY): Payer: Self-pay

## 2014-11-19 ENCOUNTER — Inpatient Hospital Stay (HOSPITAL_COMMUNITY)
Admission: AD | Admit: 2014-11-19 | Discharge: 2014-11-19 | Disposition: A | Discharge status: Home / Self Care | Payer: Medicaid Other | Source: Ambulatory Visit | Attending: Obstetrics & Gynecology | Admitting: Obstetrics & Gynecology

## 2014-11-19 DIAGNOSIS — F17210 Nicotine dependence, cigarettes, uncomplicated: Secondary | ICD-10-CM | POA: Insufficient documentation

## 2014-11-19 DIAGNOSIS — N938 Other specified abnormal uterine and vaginal bleeding: Secondary | ICD-10-CM

## 2014-11-19 DIAGNOSIS — R109 Unspecified abdominal pain: Secondary | ICD-10-CM | POA: Insufficient documentation

## 2014-11-19 HISTORY — DX: Hypoglycemia, unspecified: E16.2

## 2014-11-19 LAB — URINALYSIS, ROUTINE W REFLEX MICROSCOPIC
Bilirubin Urine: NEGATIVE
GLUCOSE, UA: NEGATIVE mg/dL
Ketones, ur: NEGATIVE mg/dL
Nitrite: NEGATIVE
Protein, ur: 30 mg/dL — AB
UROBILINOGEN UA: 0.2 mg/dL (ref 0.0–1.0)
pH: 7 (ref 5.0–8.0)

## 2014-11-19 LAB — CBC
HEMATOCRIT: 41 % (ref 36.0–46.0)
HEMOGLOBIN: 13.4 g/dL (ref 12.0–15.0)
MCH: 31.2 pg (ref 26.0–34.0)
MCHC: 32.7 g/dL (ref 30.0–36.0)
MCV: 95.3 fL (ref 78.0–100.0)
PLATELETS: 344 10*3/uL (ref 150–400)
RBC: 4.3 MIL/uL (ref 3.87–5.11)
RDW: 13.9 % (ref 11.5–15.5)
WBC: 11 10*3/uL — AB (ref 4.0–10.5)

## 2014-11-19 LAB — URINE MICROSCOPIC-ADD ON

## 2014-11-19 LAB — POCT PREGNANCY, URINE: Preg Test, Ur: NEGATIVE

## 2014-11-19 MED ORDER — KETOROLAC TROMETHAMINE 60 MG/2ML IM SOLN
60.0000 mg | Freq: Once | INTRAMUSCULAR | Status: DC
  Filled 2014-11-19: qty 2

## 2014-11-19 MED ORDER — PROMETHAZINE HCL 25 MG PO TABS
25.0000 mg | ORAL_TABLET | Freq: Four times a day (QID) | ORAL | Status: DC | PRN
  Administered 2014-11-19: 25 mg via ORAL
  Filled 2014-11-19: qty 1

## 2014-11-19 NOTE — MAU Note (Signed)
Pt upset as Dr. Ihor Dow will not authorize pain medication to go home with. Patient states that "no one" has helped her with anything and she has been in MAU for 2 hours.

## 2014-11-19 NOTE — MAU Note (Signed)
Soledad Gerlach NP in to discuss with patient discharge.

## 2014-11-19 NOTE — Discharge Instructions (Signed)
Abnormal Uterine Bleeding Abnormal uterine bleeding can affect women at various stages in life, including teenagers, women in their reproductive years, pregnant women, and women who have reached menopause. Several kinds of uterine bleeding are considered abnormal, including:  Bleeding or spotting between periods.   Bleeding after sexual intercourse.   Bleeding that is heavier or more than normal.   Periods that last longer than usual.  Bleeding after menopause.  Many cases of abnormal uterine bleeding are minor and simple to treat, while others are more serious. Any type of abnormal bleeding should be evaluated by your health care provider. Treatment will depend on the cause of the bleeding. HOME CARE INSTRUCTIONS Monitor your condition for any changes. The following actions may help to alleviate any discomfort you are experiencing:  Avoid the use of tampons and douches as directed by your health care provider.  Change your pads frequently. You should get regular pelvic exams and Pap tests. Keep all follow-up appointments for diagnostic tests as directed by your health care provider.  SEEK MEDICAL CARE IF:   Your bleeding lasts more than 1 week.   You feel dizzy at times.  SEEK IMMEDIATE MEDICAL CARE IF:   You pass out.   You are changing pads every 15 to 30 minutes.   You have abdominal pain.  You have a fever.   You become sweaty or weak.   You are passing large blood clots from the vagina.   You start to feel nauseous and vomit. MAKE SURE YOU:   Understand these instructions.  Will watch your condition.  Will get help right away if you are not doing well or get worse. Document Released: 11/03/2005 Document Revised: 11/08/2013 Document Reviewed: 06/02/2013 Exit[employer] Patient Information 2015 [employer]. This information is not intended to replace advice given to you by your health care provider. Make sure you discuss any questions you have with your  health care provider.  

## 2014-11-19 NOTE — MAU Note (Signed)
Pt states was seen at [facility] Friday and had u/s completed yesterday. Was told to come here for further eval. Having pain and bleeding. Bleeding has slowed down today however pain has increased in lower abdomen radiating down to legs.

## 2014-11-19 NOTE — MAU Note (Addendum)
Pt says she found out she had fibroids yesterday at [facility].  Has had vaginal bleeding for one month but has gotten worse for the past few days.  Has abdominal back and leg pain.  Took some ibuprofen for the pain this morning but has not taken any more because it did not help and made her stomach hurt.

## 2014-11-19 NOTE — MAU Provider Note (Signed)
History     CSN: [id number]  Arrival date and time: 11/19/14 1628   First Provider Initiated Contact with Patient 11/19/14 1755      Chief Complaint  Patient presents with  . Abdominal Pain  . Back Pain  . Fibroids  . Vaginal Bleeding   HPI Christine Campbell 39 y.o. [id number] nonpregnant female presents complaining of vaginal bleeding and pain.  She was diagnosed with fibroids on u/s yesterday and told to follow up with OB/GYN.  She does not have established care.  At the beginning of December it was intermittent and like a light period but over the last 2 weeks it has been daily and heavier.  The last few days it has been very heavy.  She is having back pain and lower abdominal pain and this is worse on the left.  Bowel movements are regular.  She has been experiencing nausea and vomiting as well as fatigue.  She denies headache, dizziness, weakness, SOB, chest pain, palpitations.  She declines evaluation for STD's.   OB History    Gravida Para Term Preterm AB TAB SAB Ectopic Multiple Living   6 3 3  0 3 1 1   3       Past Medical History  Diagnosis Date  . Migraine   . Seizures   . Brain injury   . Asthma   . Bronchitis, chronic   . Hypoglycemia     Past Surgical History  Procedure Laterality Date  . Tubal ligation    . Traumatic brain injury    . Video bronchoscopy Bilateral 11/23/2013    Procedure: VIDEO BRONCHOSCOPY WITH FLUORO;  Surgeon: Rigoberto Noel, MD;  Location: [place];  Service: Cardiopulmonary;  Laterality: Bilateral;    History reviewed. No pertinent family history.  History  Substance Use Topics  . Smoking status: Current Every Day Smoker -- 24 years    Types: Cigarettes  . Smokeless tobacco: Never Used  . Alcohol Use: No    Allergies:  Allergies  Allergen Reactions  . Cephalosporins Anaphylaxis  . Penicillins Anaphylaxis  . Erythromycin Hives  . Tramadol Hives  . Vicodin [Hydrocodone-Acetaminophen] Hives    Prescriptions prior to admission   Medication Sig Dispense Refill Last Dose  . albuterol (PROVENTIL HFA;VENTOLIN HFA) 108 (90 BASE) MCG/ACT inhaler Inhale 2 puffs into the lungs every 4 (four) hours as needed for wheezing or shortness of breath. 1 Inhaler 2 Past Month at Unknown time  . gabapentin (NEURONTIN) 300 MG capsule Take 300 mg by mouth 3 (three) times daily.   11/18/2014 at Unknown time  . ibuprofen (ADVIL,MOTRIN) 600 MG tablet Take 1 tablet (600 mg total) by mouth every 6 (six) hours as needed. (Patient taking differently: Take 600 mg by mouth every 6 (six) hours as needed for mild pain. ) 30 tablet 0 11/19/2014 at 0900  . oxyCODONE-acetaminophen (PERCOCET) 5-325 MG per tablet Take 1-2 tablets by mouth every 4 (four) hours as needed. (Patient taking differently: Take 1-2 tablets by mouth every 4 (four) hours as needed for severe pain. ) 15 tablet 0 11/19/2014 at 0300  . phenytoin (DILANTIN) 100 MG ER capsule Take 2 capsules (200 mg total) by mouth 2 (two) times daily. 60 capsule 0 Past Month at Unknown time  . clindamycin (CLEOCIN) 150 MG capsule Take 2 capsules (300 mg total) by mouth 3 (three) times daily. (Patient not taking: Reported on 11/19/2014) 42 capsule 0   . Tiotropium Bromide Monohydrate (SPIRIVA RESPIMAT) 2.5 MCG/ACT AERS Inhale 2 puffs into  the lungs daily. (Patient taking differently: Inhale 2 puffs into the lungs daily as needed (for shortness of breath). ) 1 Inhaler 0 rescue    ROS Pertinent ROS in HPI  Physical Exam   Blood pressure 127/76, pulse 82, temperature 98.2 F (36.8 C), temperature source Oral, resp. rate 18, height 5\' 1"  (1.549 m), weight 158 lb 6.4 oz (71.85 kg), last menstrual period 11/05/2014.  Physical Exam  Constitutional: She is oriented to person, place, and time. She appears well-developed and well-nourished.  HENT:  Head: Normocephalic and atraumatic.  Eyes: EOM are normal.  Neck: Normal range of motion.  Cardiovascular: Normal rate and regular rhythm.   Respiratory: Effort normal and  breath sounds normal. No respiratory distress.  GI: Soft. Bowel sounds are normal. She exhibits no distension. There is tenderness.  Tender in left lower quadrant  Musculoskeletal: Normal range of motion.  Neurological: She is alert and oriented to person, place, and time.  Skin: Skin is warm and dry.  Psychiatric: She has a normal mood and affect.   Results for orders placed or performed during the hospital encounter of 11/19/14 (from the past 24 hour(s))  Urinalysis, Routine w reflex microscopic     Status: Abnormal   Collection Time: 11/19/14  4:28 PM  Result Value Ref Range   Color, Urine RED (A) YELLOW   APPearance HAZY (A) CLEAR   Specific Gravity, Urine <1.005 (L) 1.005 - 1.030   pH 7.0 5.0 - 8.0   Glucose, UA NEGATIVE NEGATIVE mg/dL   Hgb urine dipstick LARGE (A) NEGATIVE   Bilirubin Urine NEGATIVE NEGATIVE   Ketones, ur NEGATIVE NEGATIVE mg/dL   Protein, ur 30 (A) NEGATIVE mg/dL   Urobilinogen, UA 0.2 0.0 - 1.0 mg/dL   Nitrite NEGATIVE NEGATIVE   Leukocytes, UA TRACE (A) NEGATIVE  Urine microscopic-add on     Status: Abnormal   Collection Time: 11/19/14  4:28 PM  Result Value Ref Range   Squamous Epithelial / LPF FEW (A) RARE   WBC, UA 0-2 <3 WBC/hpf   RBC / HPF 3-6 <3 RBC/hpf   Bacteria, UA FEW (A) RARE  Pregnancy, urine POC     Status: None   Collection Time: 11/19/14  5:41 PM  Result Value Ref Range   Preg Test, Ur NEGATIVE NEGATIVE  CBC     Status: Abnormal   Collection Time: 11/19/14  6:05 PM  Result Value Ref Range   WBC 11.0 (H) 4.0 - 10.5 K/uL   RBC 4.30 3.87 - 5.11 MIL/uL   Hemoglobin 13.4 12.0 - 15.0 g/dL   HCT 41.0 36.0 - 46.0 %   MCV 95.3 78.0 - 100.0 fL   MCH 31.2 26.0 - 34.0 pg   MCHC 32.7 30.0 - 36.0 g/dL   RDW 13.9 11.5 - 15.5 %   Platelets 344 150 - 400 K/uL   US Transvaginal Non-ob  11/18/2014   CLINICAL DATA:  Initial encounter for left lower quadrant pelvic and back pain with heavy vaginal bleeding for 1 month.  EXAM: TRANSABDOMINAL AND  TRANSVAGINAL ULTRASOUND OF PELVIS  TECHNIQUE: Both transabdominal and transvaginal ultrasound examinations of the pelvis were performed. Transabdominal technique was performed for global imaging of the pelvis including uterus, ovaries, adnexal regions, and pelvic cul-de-sac. It was necessary to proceed with endovaginal exam following the transabdominal exam to visualize the ovaries.  COMPARISON:  No comparison studies available.  FINDINGS: Uterus  Measurements: 9.9 x 4.5 x 5.6 cm. 1.2 x 1.1 x 1.0 cm subserosal fibroid is  identified in the anterior uterine body.  Endometrium  Thickness: 9 mm. Endometrial cavity is mildly heterogeneous which may reflect some blood products. There is a tiny cystic focus measuring about 3 mm associated the anterior endometrium, nonspecific.  Right ovary  Measurements: 3.2 x 1.9 x 2.3 cm. Normal appearance/no adnexal mass.  Left ovary  Measurements: 2.5 x 1.4 x 1.6 cm. Normal appearance/no adnexal mass.  Other findings  No free fluid.  IMPRESSION: 1. Small anterior subserosal uterine fibroid. 2. Heterogeneous endometrium may reflect the presence of some blood products within the endometrial canal.   Electronically Signed   By: Misty Stanley M.D.   On: 11/18/2014 15:53   US Pelvis Complete  11/18/2014   CLINICAL DATA:  Initial encounter for left lower quadrant pelvic and back pain with heavy vaginal bleeding for 1 month.  EXAM: TRANSABDOMINAL AND TRANSVAGINAL ULTRASOUND OF PELVIS  TECHNIQUE: Both transabdominal and transvaginal ultrasound examinations of the pelvis were performed. Transabdominal technique was performed for global imaging of the pelvis including uterus, ovaries, adnexal regions, and pelvic cul-de-sac. It was necessary to proceed with endovaginal exam following the transabdominal exam to visualize the ovaries.  COMPARISON:  No comparison studies available.  FINDINGS: Uterus  Measurements: 9.9 x 4.5 x 5.6 cm. 1.2 x 1.1 x 1.0 cm subserosal fibroid is identified in the  anterior uterine body.  Endometrium  Thickness: 9 mm. Endometrial cavity is mildly heterogeneous which may reflect some blood products. There is a tiny cystic focus measuring about 3 mm associated the anterior endometrium, nonspecific.  Right ovary  Measurements: 3.2 x 1.9 x 2.3 cm. Normal appearance/no adnexal mass.  Left ovary  Measurements: 2.5 x 1.4 x 1.6 cm. Normal appearance/no adnexal mass.  Other findings  No free fluid.  IMPRESSION: 1. Small anterior subserosal uterine fibroid. 2. Heterogeneous endometrium may reflect the presence of some blood products within the endometrial canal.   Electronically Signed   By: Misty Stanley M.D.   On: 11/18/2014 15:53    MAU Course  Procedures  MDM Pt initially ordered phenergan to help with nausea.  IM Toradol ordered as pt requests pain medication asap and does not want to wait for phenergan to take effect.  She also is not agreeable to pelvic exam until after receiving pain medication.  When nurse went to give medication, pt told her she wasn't really nauseous anyway and she refused Toradol because it would burn.  Pt's case reviewed with Dr. Ihor Dow.  Hemoglobin is stable.  Fibroid is small.  Pt does not have an emergent issue at this time.  No further eval or treatment required.  Will notify clinic to request they schedule GYN appt for her.   Of note, pt was notified of plan and was very unhappy.  Provider accompanied nurse for discharge and noted the pt to throw the stylus after "signing" the electronic signature pad.    Assessment and Plan  A:  1. Dysfunctional uterine bleeding    P: Discharge to home May continue to use Ibuprofen previously prescribed for pain.  Advise to take with food.  GYN clinic notified and to call pt to schedule appt for eval.  Patient may return to MAU as needed or if her condition were to change or worsen  Paticia Stack 11/19/2014, 5:56 PM

## 2014-11-20 ENCOUNTER — Telehealth: Payer: Self-pay

## 2014-11-20 ENCOUNTER — Telehealth (HOSPITAL_BASED_OUTPATIENT_CLINIC_OR_DEPARTMENT_OTHER): Payer: Self-pay | Admitting: Emergency Medicine

## 2014-11-20 NOTE — ED Notes (Signed)
Received tcf patient stating she was seen here Friday night, had u/s performed Saturday on outpatient basis and was then sent to ER at [facility] hospital. Per patient, she was told there was nothing they could due for her pain and that she needed a hysterectomy and was given a referral for a gyn surgeon but they are unable to see her until february. upon review of chart, patient had spoken to RN yesterday and was told she could not prescribe anything over the phone, that she would need to come back into MAU to be evaluated. She was seen on 11/17/2014 at [facility] and [facility] and was given ibuprofen for pain.  I again re-educated patient that there is nothing that I can do over the phone and that she would have to be re-evaluated. I told her that we would be happy to see her and re-evaluate her pain, she stated she has ibuprofen, but has not been taking them as prescribed.  Pt then stated that she does not have a ride to [place] or [place] to be seen again, I suggested going to ER closer to her home, she screamed at me that "they will not do anything for her either just like yall want do anything". I attempted to calm patient down and encouraged her to take her ibuprofen and if she felt something else was wrong that she could call for emergency services. At that point she started screaming saying she didn't need emergency services, she needed someone to make the pain in her abdomen go away. I again restated that we are unable to prescribe medications over the phone without her being re-evaluated, at this patient  got verbally upset and started yelling and hung up the phone

## 2014-11-20 NOTE — Telephone Encounter (Signed)
Patient called requesting a nurse call her back-- did not specify reason for call. Called patient who states she had been to hospital 3 different times and was there this past weekends-- states she had an U/S that showed fibroid and she is in pain. States she cant be seen until February but is in pain and wants to know what she can take. Advised patient to continue Ibuprofen and informed her nothing else can be prescribed without evaluation. Informed patient if she would like she can call to see if there are any cancellations to be seen earlier. Patient verbalized understanding. No questions or concerns.

## 2014-12-27 ENCOUNTER — Encounter: Payer: Medicaid Other | Admitting: Obstetrics & Gynecology

## 2015-01-31 ENCOUNTER — Encounter: Payer: Self-pay | Admitting: Obstetrics & Gynecology

## 2015-01-31 ENCOUNTER — Other Ambulatory Visit (HOSPITAL_COMMUNITY)
Admission: RE | Admit: 2015-01-31 | Discharge: 2015-01-31 | Disposition: A | Discharge status: Home / Self Care | Payer: Self-pay | Source: Ambulatory Visit | Attending: Obstetrics & Gynecology | Admitting: Obstetrics & Gynecology

## 2015-01-31 ENCOUNTER — Ambulatory Visit (INDEPENDENT_AMBULATORY_CARE_PROVIDER_SITE_OTHER): Payer: Self-pay | Admitting: Obstetrics & Gynecology

## 2015-01-31 VITALS — BP 138/99 | HR 96 | Temp 98.7°F | Ht 61.0 in | Wt 152.2 lb

## 2015-01-31 DIAGNOSIS — Z118 Encounter for screening for other infectious and parasitic diseases: Secondary | ICD-10-CM

## 2015-01-31 DIAGNOSIS — Z01419 Encounter for gynecological examination (general) (routine) without abnormal findings: Secondary | ICD-10-CM

## 2015-01-31 DIAGNOSIS — Z1151 Encounter for screening for human papillomavirus (HPV): Secondary | ICD-10-CM

## 2015-01-31 DIAGNOSIS — N938 Other specified abnormal uterine and vaginal bleeding: Secondary | ICD-10-CM

## 2015-01-31 DIAGNOSIS — Z124 Encounter for screening for malignant neoplasm of cervix: Secondary | ICD-10-CM

## 2015-01-31 DIAGNOSIS — Z113 Encounter for screening for infections with a predominantly sexual mode of transmission: Secondary | ICD-10-CM

## 2015-01-31 DIAGNOSIS — Z0000 Encounter for general adult medical examination without abnormal findings: Secondary | ICD-10-CM

## 2015-01-31 DIAGNOSIS — R87619 Unspecified abnormal cytological findings in specimens from cervix uteri: Secondary | ICD-10-CM | POA: Insufficient documentation

## 2015-01-31 LAB — POCT PREGNANCY, URINE: Preg Test, Ur: NEGATIVE

## 2015-01-31 NOTE — Progress Notes (Signed)
   Subjective:    Patient ID: Christine Campbell, female    DOB: [birth year], 40 y.o.   MRN: [id number]  HPI 39 yo DW P3 (18,12,9 yo kids) here today constant pain and bleeding since 12/15. She says that she bled the entire month of December.Prior to December she says that her periods were regular, lasting 3-4 days. She doesn't recall any changes in her life prior to this change. She says that IBU didn't help so she took some percocet that she had left over from a foot injury. She says the last percocet she took was about a week ago. She is telling me that she wants a hysterectomy. An u/s done 1/16 showed a 1 cm subserosal fibroid.   Review of Systems She gives a h/o abnormal paps requiring biopsies. She says that her last pap was 9 years ago. She is unemployed.    Objective:   Physical Exam  Anxious [facility], [facility] Breathing and ambulating normally  Pap smear obtained   UPT negative, consent signed, time out done Cervix prepped with betadine and grasped with a single tooth tenaculum Uterus sounded to 9 cm Pipelle used for 2 passes with a moderate amount of tissue obtained. She tolerated the procedure well.  Hemoglobin 13.4      Assessment & Plan:   DUB - await EMBX results Pelvic pain- it seems unlikely that a 1 cm subserosal fibroid is causing enough pain to induce someone to ask for a hysterectomy. I spoke frankly about why I do not prescribe narcotics except for post operatively. I have recommended a trial of depo provera  RTC 1 month

## 2015-02-01 LAB — CYTOLOGY - PAP

## 2015-02-03 ENCOUNTER — Emergency Department (HOSPITAL_BASED_OUTPATIENT_CLINIC_OR_DEPARTMENT_OTHER)
Admission: EM | Admit: 2015-02-03 | Discharge: 2015-02-03 | Disposition: A | Discharge status: Home / Self Care | Payer: Self-pay | Attending: Emergency Medicine | Admitting: Emergency Medicine

## 2015-02-03 ENCOUNTER — Emergency Department (HOSPITAL_BASED_OUTPATIENT_CLINIC_OR_DEPARTMENT_OTHER): Payer: Self-pay

## 2015-02-03 ENCOUNTER — Encounter (HOSPITAL_BASED_OUTPATIENT_CLINIC_OR_DEPARTMENT_OTHER): Payer: Self-pay | Admitting: *Deleted

## 2015-02-03 DIAGNOSIS — J45909 Unspecified asthma, uncomplicated: Secondary | ICD-10-CM | POA: Insufficient documentation

## 2015-02-03 DIAGNOSIS — Z87820 Personal history of traumatic brain injury: Secondary | ICD-10-CM | POA: Insufficient documentation

## 2015-02-03 DIAGNOSIS — Z720 Tobacco use: Secondary | ICD-10-CM | POA: Insufficient documentation

## 2015-02-03 DIAGNOSIS — Z79899 Other long term (current) drug therapy: Secondary | ICD-10-CM | POA: Insufficient documentation

## 2015-02-03 DIAGNOSIS — R519 Headache, unspecified: Secondary | ICD-10-CM

## 2015-02-03 DIAGNOSIS — G40909 Epilepsy, unspecified, not intractable, without status epilepticus: Secondary | ICD-10-CM | POA: Insufficient documentation

## 2015-02-03 DIAGNOSIS — G43909 Migraine, unspecified, not intractable, without status migrainosus: Secondary | ICD-10-CM | POA: Insufficient documentation

## 2015-02-03 DIAGNOSIS — R51 Headache: Secondary | ICD-10-CM

## 2015-02-03 DIAGNOSIS — Z880 Allergy status to penicillin: Secondary | ICD-10-CM | POA: Insufficient documentation

## 2015-02-03 LAB — BASIC METABOLIC PANEL
ANION GAP: 12 (ref 5–15)
BUN: 13 mg/dL (ref 6–23)
CALCIUM: 9.7 mg/dL (ref 8.4–10.5)
CHLORIDE: 106 mmol/L (ref 96–112)
CO2: 19 mmol/L (ref 19–32)
CREATININE: 0.8 mg/dL (ref 0.50–1.10)
GFR calc Af Amer: >90 mL/min (ref >90)
GFR calc non Af Amer: >90 mL/min (ref >90)
GLUCOSE: 88 mg/dL (ref 70–99)
Potassium: 4.5 mmol/L (ref 3.5–5.1)
Sodium: 137 mmol/L (ref 135–145)

## 2015-02-03 LAB — URINALYSIS, ROUTINE W REFLEX MICROSCOPIC
Bilirubin Urine: NEGATIVE
GLUCOSE, UA: NEGATIVE mg/dL
Ketones, ur: NEGATIVE mg/dL
NITRITE: NEGATIVE
PH: 6 (ref 5.0–8.0)
Protein, ur: NEGATIVE mg/dL
SPECIFIC GRAVITY, URINE: 1.016 (ref 1.005–1.030)
Urobilinogen, UA: 0.2 mg/dL (ref 0.0–1.0)

## 2015-02-03 LAB — URINE MICROSCOPIC-ADD ON

## 2015-02-03 LAB — PHENYTOIN LEVEL, TOTAL: Phenytoin Lvl: <2.5 ug/mL — ABNORMAL LOW (ref 10.0–20.0)

## 2015-02-03 LAB — PREGNANCY, URINE: Preg Test, Ur: NEGATIVE

## 2015-02-03 MED ORDER — OXYCODONE-ACETAMINOPHEN 5-325 MG PO TABS
1.0000 | ORAL_TABLET | Freq: Once | ORAL | Status: AC
  Administered 2015-02-03: 1 via ORAL
  Filled 2015-02-03: qty 1

## 2015-02-03 MED ORDER — PHENYTOIN SODIUM EXTENDED 100 MG PO CAPS
200.0000 mg | ORAL_CAPSULE | Freq: Once | ORAL | Status: AC
  Administered 2015-02-03: 200 mg via ORAL
  Filled 2015-02-03: qty 2

## 2015-02-03 MED ORDER — PHENYTOIN SODIUM EXTENDED 100 MG PO CAPS: 300.0000 mg | ORAL_CAPSULE | Freq: Once | ORAL | Status: DC

## 2015-02-03 MED ORDER — PHENYTOIN SODIUM EXTENDED 100 MG PO CAPS
400.0000 mg | ORAL_CAPSULE | Freq: Once | ORAL | Status: AC
  Administered 2015-02-03: 400 mg via ORAL
  Filled 2015-02-03: qty 4

## 2015-02-03 MED ORDER — METHOCARBAMOL 500 MG PO TABS: 500.0000 mg | ORAL_TABLET | Freq: Two times a day (BID) | ORAL | Status: DC

## 2015-02-03 MED ORDER — PHENYTOIN SODIUM EXTENDED 100 MG PO CAPS: 400.0000 mg | ORAL_CAPSULE | Freq: Once | ORAL | Status: DC

## 2015-02-03 MED ORDER — IBUPROFEN 800 MG PO TABS
800.0000 mg | ORAL_TABLET | Freq: Once | ORAL | Status: AC
  Administered 2015-02-03: 800 mg via ORAL
  Filled 2015-02-03: qty 1

## 2015-02-03 MED ORDER — ONDANSETRON 4 MG PO TBDP
4.0000 mg | ORAL_TABLET | Freq: Once | ORAL | Status: AC
  Administered 2015-02-03: 4 mg via ORAL
  Filled 2015-02-03: qty 1

## 2015-02-03 MED ORDER — PHENYTOIN SODIUM EXTENDED 100 MG PO CAPS: 200.0000 mg | ORAL_CAPSULE | Freq: Two times a day (BID) | ORAL | Status: DC
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
## 2015-02-03 NOTE — ED Notes (Addendum)
Pt states she had a seizure on Thursday and hit her head. C/o neck stiffness and head pain. Unknown what her head hit at time of seizure. Also wants right upper tooth checked. Pupils 4 and perrl.
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
## 2015-02-03 NOTE — ED Notes (Signed)
Spoke with Nena Jordan, [facility] -- we were unable to establish IV access after several attempts and pt refused additional attempts. Anderson Malta states it's safe to give 1,000mg  Dilantin in a single PO dose, but it's possible it could make pt vomit; states it's ok to give Zofran first. Confirmed plan of care with Dr. Wyvonnia Dusky and adjusted orders accordingly. Connye Burkitt, RN

## 2015-02-03 NOTE — Discharge Instructions (Signed)
Seizure, Adult Take your seizure medications as prescribed. Follow up with your doctor. Return to the ED if you develop new or worsening symptoms. A seizure is abnormal electrical activity in the brain. Seizures usually last from 30 seconds to 2 minutes. There are various types of seizures. Before a seizure, you may have a warning sensation (aura) that a seizure is about to occur. An aura may include the following symptoms:   Fear or anxiety.  Nausea.  Feeling like the room is spinning (vertigo).  Vision changes, such as seeing flashing lights or spots. Common symptoms during a seizure include:  A change in attention or behavior (altered mental status).  Convulsions with rhythmic jerking movements.  Drooling.  Rapid eye movements.  Grunting.  Loss of bladder and bowel control.  Bitter taste in the mouth.  Tongue biting. After a seizure, you may feel confused and sleepy. You may also have an injury resulting from convulsions during the seizure. HOME CARE INSTRUCTIONS   If you are given medicines, take them exactly as prescribed by your health care provider.  Keep all follow-up appointments as directed by your health care provider.  Do not swim or drive or engage in risky activity during which a seizure could cause further injury to you or others until your health care provider says it is OK.  Get adequate rest.  Teach friends and family what to do if you have a seizure. They should:  Lay you on the ground to prevent a fall.  Put a cushion under your head.  Loosen any tight clothing around your neck.  Turn you on your side. If vomiting occurs, this helps keep your airway clear.  Stay with you until you recover.  Know whether or not you need emergency care. SEEK IMMEDIATE MEDICAL CARE IF:  The seizure lasts longer than 5 minutes.  The seizure is severe or you do not wake up immediately after the seizure.  You have an altered mental status after the  seizure.  You are having more frequent or worsening seizures. Someone should drive you to the emergency department or call local emergency services (911 in [place] MAKE SURE YOU:  Understand these instructions.  Will watch your condition.  Will get help right away if you are not doing well or get worse. Document Released: 10/31/2000 Document Revised: 08/24/2013 Document Reviewed: 06/15/2013 [facility] Patient Information 2015 [place], [place]. This information is not intended to replace advice given to you by your health care provider. Make sure you discuss any questions you have with your health care provider.

## 2015-02-07 ENCOUNTER — Telehealth: Payer: Self-pay | Admitting: *Deleted

## 2015-02-07 NOTE — Telephone Encounter (Signed)
Pt left message requesting test results.  

## 2015-02-07 NOTE — Telephone Encounter (Signed)
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 Patient does not have insurance. Will email Sabrina with patient's info

## 2015-02-12 NOTE — Telephone Encounter (Signed)
Called patient back and informed her of results. Advised patient of abnormal pap and that Gabriel Cirri would be contacting her.

## 2015-02-12 NOTE — Telephone Encounter (Signed)
Called Gabriel Cirri and she has patients information she will contact her once we have given results. Called patient and her boyfriend answered stated that he has the phone and is headed home. He will have patient call us back.

## 2015-02-17 ENCOUNTER — Encounter (HOSPITAL_BASED_OUTPATIENT_CLINIC_OR_DEPARTMENT_OTHER): Payer: Self-pay

## 2015-02-17 ENCOUNTER — Emergency Department (HOSPITAL_BASED_OUTPATIENT_CLINIC_OR_DEPARTMENT_OTHER)
Admission: EM | Admit: 2015-02-17 | Discharge: 2015-02-17 | Disposition: A | Discharge status: Home / Self Care | Payer: Self-pay | Attending: Emergency Medicine | Admitting: Emergency Medicine

## 2015-02-17 DIAGNOSIS — K0889 Other specified disorders of teeth and supporting structures: Secondary | ICD-10-CM

## 2015-02-17 DIAGNOSIS — Z87828 Personal history of other (healed) physical injury and trauma: Secondary | ICD-10-CM | POA: Insufficient documentation

## 2015-02-17 DIAGNOSIS — Z8639 Personal history of other endocrine, nutritional and metabolic disease: Secondary | ICD-10-CM | POA: Insufficient documentation

## 2015-02-17 DIAGNOSIS — G40909 Epilepsy, unspecified, not intractable, without status epilepticus: Secondary | ICD-10-CM | POA: Insufficient documentation

## 2015-02-17 DIAGNOSIS — J45909 Unspecified asthma, uncomplicated: Secondary | ICD-10-CM | POA: Insufficient documentation

## 2015-02-17 DIAGNOSIS — K088 Other specified disorders of teeth and supporting structures: Secondary | ICD-10-CM | POA: Insufficient documentation

## 2015-02-17 DIAGNOSIS — Z79899 Other long term (current) drug therapy: Secondary | ICD-10-CM | POA: Insufficient documentation

## 2015-02-17 DIAGNOSIS — G43909 Migraine, unspecified, not intractable, without status migrainosus: Secondary | ICD-10-CM | POA: Insufficient documentation

## 2015-02-17 DIAGNOSIS — Z720 Tobacco use: Secondary | ICD-10-CM | POA: Insufficient documentation

## 2015-02-17 MED ORDER — CLINDAMYCIN HCL 150 MG PO CAPS: 300.0000 mg | ORAL_CAPSULE | Freq: Four times a day (QID) | ORAL | Status: DC

## 2015-02-17 MED ORDER — IBUPROFEN 600 MG PO TABS: 600.0000 mg | ORAL_TABLET | Freq: Four times a day (QID) | ORAL | Status: DC | PRN

## 2015-02-17 MED ORDER — OXYCODONE-ACETAMINOPHEN 5-325 MG PO TABS
1.0000 | ORAL_TABLET | Freq: Once | ORAL | Status: AC
  Administered 2015-02-17: 1 via ORAL
  Filled 2015-02-17: qty 1

## 2015-02-17 NOTE — Discharge Instructions (Signed)
Ibuprofen for pain. Clindamycin for infection. Follow up with a dentist as referred.    Dental Pain A tooth ache may be caused by cavities (tooth decay). Cavities expose the nerve of the tooth to air and hot or cold temperatures. It may come from an infection or abscess (also called a boil or furuncle) around your tooth. It is also often caused by dental caries (tooth decay). This causes the pain you are having. DIAGNOSIS  Your caregiver can diagnose this problem by exam. TREATMENT   If caused by an infection, it may be treated with medications which kill germs (antibiotics) and pain medications as prescribed by your caregiver. Take medications as directed.  Only take over-the-counter or prescription medicines for pain, discomfort, or fever as directed by your caregiver.  Whether the tooth ache today is caused by infection or dental disease, you should see your dentist as soon as possible for further care. SEEK MEDICAL CARE IF: The exam and treatment you received today has been provided on an emergency basis only. This is not a substitute for complete medical or dental care. If your problem worsens or new problems (symptoms) appear, and you are unable to meet with your dentist, call or return to this location. SEEK IMMEDIATE MEDICAL CARE IF:   You have a fever.  You develop redness and swelling of your face, jaw, or neck.  You are unable to open your mouth.  You have severe pain uncontrolled by pain medicine. MAKE SURE YOU:   Understand these instructions.  Will watch your condition.  Will get help right away if you are not doing well or get worse. Document Released: 11/03/2005 Document Revised: 01/26/2012 Document Reviewed: 06/21/2008 [facility] Patient Information 2015 [place], [place]. This information is not intended to replace advice given to you by your health care provider. Make sure you discuss any questions you have with your health care provider.   Emergency Department  Resource Guide 1) Find a Doctor and Pay Out of Pocket Although you won't have to find out who is covered by your insurance plan, it is a good idea to ask around and get recommendations. You will then need to call the office and see if the doctor you have chosen will accept you as a new patient and what types of options they offer for patients who are self-pay. Some doctors offer discounts or will set up payment plans for their patients who do not have insurance, but you will need to ask so you aren't surprised when you get to your appointment.  2) Contact Your Local Health Department Not all health departments have doctors that can see patients for sick visits, but many do, so it is worth a call to see if yours does. If you don't know where your local health department is, you can check in your phone book. The CDC also has a tool to help you locate your state's health department, and many state websites also have listings of all of their local health departments.  3) Find a [facility] If your illness is not likely to be very severe or complicated, you may want to try a walk in clinic. These are popping up all over the country in pharmacies, drugstores, and shopping centers. They're usually staffed by nurse practitioners or physician assistants that have been trained to treat common illnesses and complaints. They're usually fairly quick and inexpensive. However, if you have serious medical issues or chronic medical problems, these are probably not your best option.  No Primary  Care Doctor: - Call Health Connect at  [phone] - they can help you locate a primary care doctor that  accepts your insurance, provides certain services, etc. - Physician Referral Service- [phone]  Chronic Pain Problems: Organization         Address  Phone   Notes  [facility]  [phone] Patients need to be referred by their primary care doctor.   Medication Assistance: Organization          Address  Phone   Notes  [facility] Medication [facility] [place], [place] [address] [phone] --Must be a resident of [facility] -- Must have NO insurance coverage whatsoever (no Medicaid/ Medicare, etc.) -- The pt. MUST have a primary care doctor that directs their care regularly and follows them in the community   MedAssist  [phone]   [employer]  [phone]    Agencies that provide inexpensive medical care: Organization         Address  Phone   Notes  [place]  [phone]   [facility]    [phone]   [facility] [place], [place] [address] [phone]   [place] [address] [place] [phone]   Planned Parenthood    [phone]   [facility]    [phone]   [facility] [place] [address], [place] Phone:  [phone], Fax:  [phone] Hours of Operation:  9 am - 6 pm, M-F.  Also accepts Medicaid/Medicare and self-pay.  [facility] for [place] [address], [address], [place] Phone: [phone], Fax: [phone]. Hours of Operation:  8:30 am - 5:30 pm, M-F.  Also accepts Medicaid and self-pay.  [facility] [place] [address], [place] Phone: [phone]   [place], [place], [place] [phone], Ext. [phone] Mondays & Thursdays: 7-9 AM.  First 15 patients are seen on a first come, first serve basis.    [place] Providers:  Organization         Address  Phone   Notes  [facility] [address], [place] [phone] Also accepts self-pay patients.  [facility] [address], [place]  [phone]   [place], [address], [place] [phone]   [facility] [address] [place] [phone]   Lucianne Lei [address], [place]   [phone] Only accepts [place] Access [place] patients after they have their name applied to their card.   Self-Pay (no insurance) in [facility]:  Organization         Address  Phone   Notes  Sickle Cell Patients, [facility] [place] [phone]   [facility] [place] [phone]   [facility] [place]  [place], [place], [place] [phone]   [facility]/Dr. Osei-Bonsu  [address] [place] or [address], [place] [phone] Phone number for both [place] and [place] locations is the same.  [facility]gent [facility] and [facility] [address], [place] [phone]  [facility] [address] [place] or [address] [phone]   [facility] [place], [place] [phone], phone; [phone], fax Sees patients 1st and 3rd Saturday of every month.  Must not qualify for public or private insurance (i.e. Medicaid, Medicare, [place] Health Choice, Veterans' Benefits)  Household income should be no more than 200% of the poverty level The clinic cannot treat you if you are pregnant or think you are pregnant  Sexually transmitted diseases are not treated at the clinic.    Dental Care: Organization         Address  Phone  Notes  [facility] Department of [facility] [place] [phone] Accepts children up to age 51 who are enrolled in [place] or [place]; pregnant women with a Medicaid card; and children who have applied for Medicaid or [place] Health Choice, but were declined, whose parents can pay a reduced fee at time of service.  [facility] Department of [facility]  [address], [place] [phone] Accepts children up to age 76 who are enrolled in [place] or [place]; pregnant women with a Medicaid card; and children who have applied for Medicaid or [place] Health Choice, but were declined, whose parents can pay a reduced fee at time of service.  [place] [facility] Adult Dental Access PROGRAM  [place] [phone] Patients are seen by appointment only. Walk-ins are not accepted. [place] will see patients 50 years of age and older. Monday - Tuesday (8am-5pm) Most Wednesdays (8:30-5pm) $30 per visit, cash only  [facility] Adult Dental Access PROGRAM  [address], [facility] [phone] Patients are seen by appointment only. Walk-ins are not accepted. [place] will see patients 44 years of age and older. One Wednesday Evening (Monthly: Volunteer Based).  $30 per visit, cash only  [place]  [phone] for adults; Children under age 46, call Graduate Pediatric Dentistry at [phone]. Children aged 6-14, please call [phone] to request a pediatric application.  Dental services are provided in all areas of dental care including fillings, crowns and bridges, complete and partial dentures, implants, gum treatment, root canals, and extractions. Preventive care is also provided. Treatment is provided to both adults and children. Patients are selected via a lottery and there is often a waiting list.   [facility] [address] [place]  [phone] [email or web address]   [facility] [address] [place], [place] [phone], Ext. [phone] Second and Fourth Thursday of each month, opens at 6:30 AM; Clinic ends at 9 AM.  Patients are seen on a first-come first-served basis, and a limited number are seen during each clinic.   [facility]  [address] Hillard Danker [place], [place] [phone]   Eligibility Requirements You must  have lived in [place], [place], or [place] counties for at least the last three months.   You cannot be eligible for state or federal sponsored [employer], including [employer], [place], or [employer].   You generally cannot be eligible for healthcare insurance through your employer.    How to apply: Eligibility screenings are held every Tuesday and Wednesday afternoon from 1:00 pm until 4:00 pm. You do not need an appointment for the interview!  [facility] [address] [place], [place]  [place]  [place] Department  [place]  [phone]    Behavioral Health Resources in the Community: Intensive Outpatient Programs Organization         Address  Phone  Notes  [place]. [address] [place], [place] [phone]   [facility] [address] [place], [place]   ADS: Alcohol [facility] Drug S[facility] [address], [employer], [place]   [place] [address]  [place], [place] or [phone]   Substance Abuse Resources Organization         Address  Phone  Notes  Alcohol and Drug Services  [phone]   [place]  [phone]   [facility] [place]   Chinita Pester  [phone]   Residential & Outpatient Substance Abuse Program  [phone]   Psychological Services Organization         Address  Phone  Notes  [facility] [place]  [place]  [phone]   [place] [address] [place] or [phone]    Mobile Crisis Teams Organization         Address  Phone  Notes  [facility], Mobile Crisis Care Unit  [phone]   [facility]  [address] [place], [place]   [place] [address], [place] [phone]    Self-Help/Support Groups Organization         Address  Phone             Notes  [place]. of [place] - variety of support groups  [place] Call for more information  Narcotics Anonymous (NA), [facility] [address], [employer] [place]  2 meetings at this location   Special educational needs teacher         Address  Phone  Notes  [facility] Residential Treatment [place],    [place]  [phone]   [facility]  [address] [place] [address], [place], [place]   [place], [place] [phone] Admissions: 8am-3pm M-F  Incentives Substance [place] [address],    [place], [place] [phone]   [facility] [address] [place], [place], [place]   [facility] [address]  [place], [place]   [facility] Programs - Intensive Outpatient [address] Kristeen Mans [address], [place], [place]   [facility] [address]  [place], [place] [phone] or [phone]   [facility] (RTS) [address], [place], [place] Accepts Medicaid  [facility] [place] [address]  [place] [phone] Substance Abuse/Addiction Treatment   [facility] Organization         Address  Phone  Notes  [facility]  [phone]   Domenic Schwab, PhD [address] Arlis Porta [place], [place]   [phone] or [phone]   [place], [place] [phone]   [place] [address], [place], [place] [phone] Insurance/Medicaid/sponsorship through [employer] and Families [address], [address] [address]  [place], [place] [phone] [place], [place] [phone]    Dr. Adele Schilder  [phone]   [facility] Dept. 1) [address] [place] 2) [place] 3)  [place] [address], [place] [phone]  [phone]   [place] [phone] or [phone] (After Hours)

## 2015-02-17 NOTE — ED Notes (Signed)
Pt c/o right upper dental pain x3 weeks - reports pain gradually worsened - states she has poor dental hygiene and caries with a crown that is damaged in the right upper back area.

## 2015-02-17 NOTE — ED Provider Notes (Signed)
CSN: [id number]     Arrival date & time 02/17/15  1924 History   First MD Initiated Contact with Patient 02/17/15 2015     Chief Complaint  Patient presents with  . Dental Pain     (Consider location/radiation/quality/duration/timing/severity/associated sxs/prior Treatment) HPI Christine Campbell is a 39 y.o. 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 She states her toothache started 3 weeks ago but it's getting worse. Pain is in the right upper first molar. States she has had a crown on tooth for over 10 years is worried that "the tooth is decayed." She states she feels like her stasis swallow but it does not appear to be, and pain radiates into the right ear. Denies any fever or chills. No other complaints.   Past Medical History  Diagnosis Date  . Migraine   . Seizures   . Brain injury   . Asthma   . Bronchitis, chronic   . Hypoglycemia    Past Surgical History  Procedure Laterality Date  . Tubal ligation    . Traumatic brain injury    . Video bronchoscopy Bilateral 11/23/2013    Procedure: VIDEO BRONCHOSCOPY WITH FLUORO;  Surgeon: Rigoberto Noel, MD;  Location: [facility];  Service: Cardiopulmonary;  Laterality: Bilateral;   Family History  Problem Relation Age of Onset  . Hypertension Mother   . Diabetes Mother   . Heart disease Maternal Uncle    History  Substance Use Topics  . Smoking status: Current Every Day Smoker -- 0.50 packs/day for 24 years    Types: Cigarettes  . Smokeless tobacco: Never Used  . Alcohol Use: No   OB History    Gravida Para Term Preterm AB TAB SAB Ectopic Multiple Living   6 3 3  0 3 1 1   3      Review of Systems  Constitutional: Negative for fever and chills.  HENT: Positive for dental problem.   Neurological: Positive for headaches.      Allergies  Cephalosporins; Penicillins; Erythromycin; Tramadol; and Vicodin  Home Medications   Prior to Admission  medications   Medication Sig Start Date End Date Taking? Authorizing Provider  albuterol (PROVENTIL HFA;VENTOLIN HFA) 108 (90 BASE) MCG/ACT inhaler Inhale 2 puffs into the lungs every 4 (four) hours as needed for wheezing or shortness of breath. 11/24/13  Yes Shanker Kristeen Mans, MD  gabapentin (NEURONTIN) 300 MG capsule Take 300 mg by mouth 3 (three) times daily.   Yes Historical Provider, MD  ibuprofen (ADVIL,MOTRIN) 600 MG tablet Take 1 tablet (600 mg total) by mouth every 6 (six) hours as needed. Patient taking differently: Take 600 mg by mouth every 6 (six) hours as needed for mild pain.  10/05/14  Yes Merryl Hacker, MD  methocarbamol (ROBAXIN) 500 MG tablet Take 1 tablet (500 mg total) by mouth 2 (two) times daily. 02/03/15  Yes Ezequiel Essex, MD  phenytoin (DILANTIN) 100 MG ER capsule Take 2 capsules (200 mg total) by mouth 2 (two) times daily. 02/03/15  Yes Ezequiel Essex, MD  Tiotropium Bromide Monohydrate (SPIRIVA RESPIMAT) 2.5 MCG/ACT AERS Inhale 2 puffs into the lungs daily. Patient taking differently: Inhale 2 puffs into the lungs daily as needed (for shortness of breath).  08/30/14  Yes Rigoberto Noel, MD   BP 107/61 mmHg  Pulse 87  Temp(Src) 97.8 F (36.6 C) (Oral)  Resp 20  Ht 5\' 1"  (1.549 m)  Wt 150 lb (68.04 kg)  BMI 28.36 kg/m2  SpO2 98%  LMP 01/29/2015 Physical Exam  Constitutional: She appears well-developed and well-nourished. No distress.  HENT:  Head: Normocephalic.  Golden crowns of the right upper first molar, there is mild surrounding gum swelling and erythema. Tooth is tender to palpation. No facial swelling. No trismus. The swelling under the tongue.  Eyes: Conjunctivae are normal.  Neck: Neck supple.  Cardiovascular: Normal rate, regular rhythm and normal heart sounds.   Pulmonary/Chest: Effort normal and breath sounds normal. No respiratory distress. She has no wheezes. She has no rales.  Musculoskeletal: She exhibits no edema.  Neurological: She is  alert.  Skin: Skin is warm and dry.  Psychiatric: She has a normal mood and affect. Her behavior is normal.  Nursing note and vitals reviewed.   ED Course  Procedures (including critical care time) Labs Review Labs Reviewed - No data to display  Imaging Review No results found.   EKG Interpretation None      MDM   Final diagnoses:  Pain, dental    Patient with dental pain. No obvious abscess. No facial swelling. No lymphadenopathy. Will start on penicillin, clindamycin for the infection, ibuprofen for pain, follow-up.  Filed Vitals:   02/17/15 1944  BP: 107/61  Pulse: 87  Temp: 97.8 F (36.6 C)  TempSrc: Oral  Resp: 20  Height: 5\' 1"  (1.549 m)  Weight: 150 lb (68.04 kg)  SpO2: 98%      Jeannett Senior, PA-C 02/17/15 2329  Veryl Speak, MD 02/18/15 [phone]

## 2015-02-26 ENCOUNTER — Telehealth (HOSPITAL_COMMUNITY): Payer: Self-pay | Admitting: *Deleted

## 2015-02-26 NOTE — Telephone Encounter (Signed)
Telephoned patient at home # and patient did not have time to schedule appointment due to having another appointment. Patient to call back and schedule.

## 2015-03-20 ENCOUNTER — Encounter (HOSPITAL_BASED_OUTPATIENT_CLINIC_OR_DEPARTMENT_OTHER): Payer: Self-pay | Admitting: Emergency Medicine

## 2015-03-20 ENCOUNTER — Emergency Department (HOSPITAL_BASED_OUTPATIENT_CLINIC_OR_DEPARTMENT_OTHER): Payer: Self-pay

## 2015-03-20 ENCOUNTER — Emergency Department (HOSPITAL_BASED_OUTPATIENT_CLINIC_OR_DEPARTMENT_OTHER)
Admission: EM | Admit: 2015-03-20 | Discharge: 2015-03-20 | Disposition: A | Discharge status: Home / Self Care | Payer: Self-pay | Attending: Emergency Medicine | Admitting: Emergency Medicine

## 2015-03-20 DIAGNOSIS — Z880 Allergy status to penicillin: Secondary | ICD-10-CM | POA: Insufficient documentation

## 2015-03-20 DIAGNOSIS — Z720 Tobacco use: Secondary | ICD-10-CM | POA: Insufficient documentation

## 2015-03-20 DIAGNOSIS — J45901 Unspecified asthma with (acute) exacerbation: Secondary | ICD-10-CM | POA: Insufficient documentation

## 2015-03-20 DIAGNOSIS — Z87820 Personal history of traumatic brain injury: Secondary | ICD-10-CM | POA: Insufficient documentation

## 2015-03-20 DIAGNOSIS — Z79899 Other long term (current) drug therapy: Secondary | ICD-10-CM | POA: Insufficient documentation

## 2015-03-20 DIAGNOSIS — R0789 Other chest pain: Secondary | ICD-10-CM | POA: Insufficient documentation

## 2015-03-20 DIAGNOSIS — Z8639 Personal history of other endocrine, nutritional and metabolic disease: Secondary | ICD-10-CM | POA: Insufficient documentation

## 2015-03-20 DIAGNOSIS — G40909 Epilepsy, unspecified, not intractable, without status epilepticus: Secondary | ICD-10-CM | POA: Insufficient documentation

## 2015-03-20 DIAGNOSIS — J029 Acute pharyngitis, unspecified: Secondary | ICD-10-CM | POA: Insufficient documentation

## 2015-03-20 DIAGNOSIS — R05 Cough: Secondary | ICD-10-CM

## 2015-03-20 DIAGNOSIS — Z3202 Encounter for pregnancy test, result negative: Secondary | ICD-10-CM | POA: Insufficient documentation

## 2015-03-20 DIAGNOSIS — R059 Cough, unspecified: Secondary | ICD-10-CM

## 2015-03-20 DIAGNOSIS — G43909 Migraine, unspecified, not intractable, without status migrainosus: Secondary | ICD-10-CM | POA: Insufficient documentation

## 2015-03-20 LAB — URINE MICROSCOPIC-ADD ON

## 2015-03-20 LAB — URINALYSIS, ROUTINE W REFLEX MICROSCOPIC
Bilirubin Urine: NEGATIVE
GLUCOSE, UA: NEGATIVE mg/dL
Ketones, ur: NEGATIVE mg/dL
Nitrite: NEGATIVE
PROTEIN: NEGATIVE mg/dL
SPECIFIC GRAVITY, URINE: 1.018 (ref 1.005–1.030)
Urobilinogen, UA: 0.2 mg/dL (ref 0.0–1.0)
pH: 6 (ref 5.0–8.0)

## 2015-03-20 LAB — PREGNANCY, URINE: Preg Test, Ur: NEGATIVE

## 2015-03-20 MED ORDER — ALBUTEROL SULFATE HFA 108 (90 BASE) MCG/ACT IN AERS
6.0000 | INHALATION_SPRAY | Freq: Once | RESPIRATORY_TRACT | Status: AC
  Administered 2015-03-20: 6 via RESPIRATORY_TRACT
  Filled 2015-03-20: qty 6.7

## 2015-03-20 MED ORDER — IBUPROFEN 400 MG PO TABS
600.0000 mg | ORAL_TABLET | Freq: Once | ORAL | Status: AC
  Administered 2015-03-20: 600 mg via ORAL
  Filled 2015-03-20 (×2): qty 1

## 2015-03-20 NOTE — ED Provider Notes (Signed)
CSN: [id number]     Arrival date & time 03/20/15  1154 History   First MD Initiated Contact with Patient 03/20/15 1252     Chief Complaint  Patient presents with  . Sore Throat  . Chest Pain     (Consider location/radiation/quality/duration/timing/severity/associated sxs/prior Treatment) HPI Comments: 39 yo female with congestion and watery eyes for a few weeks.  She attributes these symptoms to allergies, but has not taken medication because Benadryl makes her drowsy.  For the past few days, she has had cough as well.  Her chest hurts when she coughs.    Patient is a 39 y.o. female presenting with pharyngitis.  Sore Throat This is a new problem. Episode onset: 2 weeks. The problem occurs constantly. The problem has been gradually worsening. Associated symptoms include chest pain. Pertinent negatives include no abdominal pain, no headaches and no shortness of breath. Associated symptoms comments: Nonproductive cough. No fevers. Nasal congestion and mucus.. Nothing aggravates the symptoms. Nothing relieves the symptoms. She has tried nothing for the symptoms.    Past Medical History  Diagnosis Date  . Migraine   . Seizures   . Brain injury   . Asthma   . Bronchitis, chronic   . Hypoglycemia    Past Surgical History  Procedure Laterality Date  . Tubal ligation    . Traumatic brain injury    . Video bronchoscopy Bilateral 11/23/2013    Procedure: VIDEO BRONCHOSCOPY WITH FLUORO;  Surgeon: Rigoberto Noel, MD;  Location: [facility];  Service: Cardiopulmonary;  Laterality: Bilateral;   Family History  Problem Relation Age of Onset  . Hypertension Mother   . Diabetes Mother   . Heart disease Maternal Uncle    History  Substance Use Topics  . Smoking status: Light Tobacco Smoker -- 0.50 packs/day for 24 years    Types: Cigarettes  . Smokeless tobacco: Never Used  . Alcohol Use: No   OB History    Gravida Para Term Preterm AB TAB SAB Ectopic Multiple Living   6 3 3  0 3 1 1   3      Review of Systems  Respiratory: Negative for shortness of breath.   Cardiovascular: Positive for chest pain.  Gastrointestinal: Negative for abdominal pain.  Neurological: Negative for headaches.  All other systems reviewed and are negative.     Allergies  Cephalosporins; Penicillins; Erythromycin; Tramadol; and Vicodin  Home Medications   Prior to Admission medications   Medication Sig Start Date End Date Taking? Authorizing Provider  albuterol (PROVENTIL HFA;VENTOLIN HFA) 108 (90 BASE) MCG/ACT inhaler Inhale 2 puffs into the lungs every 4 (four) hours as needed for wheezing or shortness of breath. 11/24/13  Yes Shanker Kristeen Mans, MD  gabapentin (NEURONTIN) 300 MG capsule Take 300 mg by mouth 3 (three) times daily.   Yes Historical Provider, MD  phenytoin (DILANTIN) 100 MG ER capsule Take 2 capsules (200 mg total) by mouth 2 (two) times daily. 02/03/15  Yes Ezequiel Essex, MD  Tiotropium Bromide Monohydrate (SPIRIVA RESPIMAT) 2.5 MCG/ACT AERS Inhale 2 puffs into the lungs daily. Patient taking differently: Inhale 2 puffs into the lungs daily as needed (for shortness of breath).  08/30/14  Yes Rigoberto Noel, MD  clindamycin (CLEOCIN) 150 MG capsule Take 2 capsules (300 mg total) by mouth every 6 (six) hours. 02/17/15   Tatyana Kirichenko, PA-C  ibuprofen (ADVIL,MOTRIN) 600 MG tablet Take 1 tablet (600 mg total) by mouth every 6 (six) hours as needed. 02/17/15   Jeannett Senior, PA-C  methocarbamol (ROBAXIN) 500 MG tablet Take 1 tablet (500 mg total) by mouth 2 (two) times daily. 02/03/15   Ezequiel Essex, MD   BP 105/59 mmHg  Pulse 86  Temp(Src) 98.2 F (36.8 C)  Resp 20  Ht 5\' 1"  (1.549 m)  Wt 145 lb (65.772 kg)  BMI 27.41 kg/m2  SpO2 99%  LMP 02/25/2015 Physical Exam  Constitutional: She is oriented to person, place, and time. She appears well-developed and well-nourished. No distress.  HENT:  Head: Normocephalic and atraumatic.  Mouth/Throat: Oropharynx is clear and moist.   Eyes: Conjunctivae are normal. Pupils are equal, round, and reactive to light. No scleral icterus.  Neck: Neck supple.  Cardiovascular: Normal rate, regular rhythm, normal heart sounds and intact distal pulses.   No murmur heard. Pulmonary/Chest: Effort normal. No stridor. No respiratory distress. She has decreased breath sounds (mildly decreased air movement). She has wheezes (on forced expiration.). She has no rales. She exhibits tenderness.    Abdominal: Soft. Bowel sounds are normal. She exhibits no distension. There is no tenderness.  Musculoskeletal: Normal range of motion.  Neurological: She is alert and oriented to person, place, and time.  Skin: Skin is warm and dry. No rash noted.  Psychiatric: She has a normal mood and affect. Her behavior is normal.  Nursing note and vitals reviewed.   ED Course  Procedures (including critical care time) Labs Review Labs Reviewed  URINALYSIS, ROUTINE W REFLEX MICROSCOPIC - Abnormal; Notable for the following:    APPearance CLOUDY (*)    Hgb urine dipstick SMALL (*)    Leukocytes, UA SMALL (*)    All other components within normal limits  URINE MICROSCOPIC-ADD ON - Abnormal; Notable for the following:    Squamous Epithelial / LPF MANY (*)    Bacteria, UA FEW (*)    All other components within normal limits  PREGNANCY, URINE    Imaging Review Dg Chest 2 View  03/20/2015   CLINICAL DATA:  Two weeks of cough and congestion, history of tobacco use, COPD  EXAM: CHEST  2 VIEW  COMPARISON:  PA and lateral chest of August 16, 2014  FINDINGS: The lungs are adequately inflated and clear. The interstitial markings are coarse but stable. The heart and pulmonary vascularity are normal. The mediastinum is normal in width. There is no pleural effusion. The bony thorax is unremarkable.  IMPRESSION: There is no active cardiopulmonary disease.   Electronically Signed   By: David  Martinique M.D.   On: 03/20/2015 13:18     EKG Interpretation None       MDM   Final diagnoses:  Cough  Sore throat    39 yo female with allergic sinusitis symptoms for past few weeks, now with cough.  Has a hx of asthma, but has not used inhalers or allergy meds.  After a single breathing treatment in ED, she felt much better.  Her chest pain appeared to be chest wall pain due to coughing.  CXR negative.  She remained well appearing.  Advised she use albuterol for her cough and take antihistamines for allergies.      Serita Grit, MD 03/20/15 [phone]

## 2015-03-20 NOTE — ED Notes (Signed)
39 yo c/o sore throat with dry cough at night. Also reports chest pressure when taking in deep breaths for a couple of weeks.

## 2015-03-20 NOTE — Discharge Instructions (Signed)
Cough, Adult  A cough is a reflex that helps clear your throat and airways. It can help heal the body or may be a reaction to an irritated airway. A cough may only last 2 or 3 weeks (acute) or may last more than 8 weeks (chronic).  CAUSES Acute cough:  Viral or bacterial infections. Chronic cough:  Infections.  Allergies.  Asthma.  Post-nasal drip.  Smoking.  Heartburn or acid reflux.  Some medicines.  Chronic lung problems (COPD).  Cancer. SYMPTOMS   Cough.  Fever.  Chest pain.  Increased breathing rate.  High-pitched whistling sound when breathing (wheezing).  Colored mucus that you cough up (sputum). TREATMENT   A bacterial cough may be treated with antibiotic medicine.  A viral cough must run its course and will not respond to antibiotics.  Your caregiver may recommend other treatments if you have a chronic cough. HOME CARE INSTRUCTIONS   Only take over-the-counter or prescription medicines for pain, discomfort, or fever as directed by your caregiver. Use cough suppressants only as directed by your caregiver.  Use a cold steam vaporizer or humidifier in your bedroom or home to help loosen secretions.  Sleep in a semi-upright position if your cough is worse at night.  Rest as needed.  Stop smoking if you smoke. SEEK IMMEDIATE MEDICAL CARE IF:   You have pus in your sputum.  Your cough starts to worsen.  You cannot control your cough with suppressants and are losing sleep.  You begin coughing up blood.  You have difficulty breathing.  You develop pain which is getting worse or is uncontrolled with medicine.  You have a fever. MAKE SURE YOU:   Understand these instructions.  Will watch your condition.  Will get help right away if you are not doing well or get worse. Document Released: 05/02/2011 Document Revised: 01/26/2012 Document Reviewed: 05/02/2011 [facility] Patient Information 2015 [place], [place]. This information is not intended  to replace advice given to you by your health care provider. Make sure you discuss any questions you have with your health care provider.  Bursitis Bursitis is inflammation of a bursa. A bursa is a soft, fluid-filled sac. It cushions the soft tissue around a bone. Bursitis often occurs in the bursas near the shoulders, elbows, knees, pelvis, hips, heel, and Achilles tendon.  SYMPTOMS   Pain and tenderness in the affected area. Sometimes, pain radiates into surrounding areas. Specifically, pain with movement.  Limited range of motion of the affected joint.  Sometimes, painless swelling of the bursa.  Fever (when infected). CAUSES   Injury to a joint or bursa.  Overuse or strenuous exercise of a joint.  Gout (disease with inflamed joints).  Prolonged pressure on a joint containing bursas (resting on an elbow or kneeling).  Arthritis.  Acute or chronic infection.  Calcium deposits in shoulder tendons, with degeneration of the tendon. RISK INCREASES WITH:  Vigorous, repeated, or sudden increase in athletic training or activity level.  Failure to warm up properly.  Overstretching.  Improper exercise technique.  Playing sports on [place]roTurf. PREVENTION  Avoid injuries or overuse of muscles.  Warm up and cool down properly. Do this before and after physical activity.  Maintain proper conditioning:  Joint flexibility.  Muscle strength and endurance.  Cardiovascular fitness.  Learn and use proper technique.  Wear protective equipment. PROGNOSIS  With proper treatment, symptoms often go away within 7 to 14 days.  RELATED COMPLICATIONS   Frequent recurrence of symptoms. This can result in a chronic, repetitive  problem.  Joint stiffness.  Limited joint movement.  Infection of bursa.  Chronic inflammation or scarring of bursa. TREATMENT Treatment first involves protecting and resting the bursa and its joint. You may use ice or an elastic bandage to reduce  inflammation. Anti-inflammatory medicines may help resolve the swelling. If symptoms persist despite treatment, a caregiver may withdraw fluid from the bursa. They might also consider a corticosteroid injection. Sometimes, bursitis will persist in spite of nonsurgical treatment or will become infected. These cases may require removal (surgical excision) of the bursa.  MEDICATION   If pain medicine is needed, nonsteroidal anti-inflammatory medicines, such as aspirin and ibuprofen, or other minor pain relievers, such as acetaminophen, are often recommended.  Do not take pain medicine for 7 days before surgery.  Prescription pain relievers are usually only prescribed after surgery. Use only as directed and only as much as you need.  Ointments applied to the skin may be helpful.  Corticosteroid injections may be given. This is done to reduce inflammation in the bursa. HEAT AND COLD:  Cold treatment (icing) relieves pain and reduces inflammation. Cold treatment should be applied for 10 to 15 minutes every 2 to 3 hours for inflammation and pain, and immediately after any activity that aggravates your symptoms. Use ice packs or an ice massage.  Heat treatment may be used prior to performing the stretching and strengthening activities prescribed by your caregiver, physical therapist, or athletic trainer. Use a heat pack or a warm soak. SEEK MEDICAL CARE IF:   Symptoms get worse or do not improve in 2 weeks, despite treatment.  New, unexplained symptoms develop. (Drugs used in treatment may produce side effects.) Document Released: 11/03/2005 Document Revised: 03/20/2014 Document Reviewed: 02/15/2009 [facility] Patient Information 2015 [place], [place]. This information is not intended to replace advice given to you by your health care provider. Make sure you discuss any questions you have with your health care provider.

## 2015-03-20 NOTE — ED Notes (Signed)
Pt states her ride is here she is ready to go.  She needs a work note. [employer] not done.

## 2015-03-20 NOTE — ED Notes (Signed)
Pt to nse desk-NAD-states her ride is here and she needs to go and needs work note

## 2015-05-27 ENCOUNTER — Emergency Department (HOSPITAL_BASED_OUTPATIENT_CLINIC_OR_DEPARTMENT_OTHER)
Admission: EM | Admit: 2015-05-27 | Discharge: 2015-05-27 | Disposition: A | Discharge status: Home / Self Care | Payer: Self-pay | Attending: Emergency Medicine | Admitting: Emergency Medicine

## 2015-05-27 ENCOUNTER — Encounter (HOSPITAL_BASED_OUTPATIENT_CLINIC_OR_DEPARTMENT_OTHER): Payer: Self-pay | Admitting: Emergency Medicine

## 2015-05-27 ENCOUNTER — Emergency Department (HOSPITAL_BASED_OUTPATIENT_CLINIC_OR_DEPARTMENT_OTHER): Payer: Self-pay

## 2015-05-27 DIAGNOSIS — G40909 Epilepsy, unspecified, not intractable, without status epilepticus: Secondary | ICD-10-CM | POA: Insufficient documentation

## 2015-05-27 DIAGNOSIS — J45909 Unspecified asthma, uncomplicated: Secondary | ICD-10-CM | POA: Insufficient documentation

## 2015-05-27 DIAGNOSIS — Z880 Allergy status to penicillin: Secondary | ICD-10-CM | POA: Insufficient documentation

## 2015-05-27 DIAGNOSIS — Y9234 Swimming pool (public) as the place of occurrence of the external cause: Secondary | ICD-10-CM | POA: Insufficient documentation

## 2015-05-27 DIAGNOSIS — S93401A Sprain of unspecified ligament of right ankle, initial encounter: Secondary | ICD-10-CM | POA: Insufficient documentation

## 2015-05-27 DIAGNOSIS — W010XXA Fall on same level from slipping, tripping and stumbling without subsequent striking against object, initial encounter: Secondary | ICD-10-CM | POA: Insufficient documentation

## 2015-05-27 DIAGNOSIS — Z8639 Personal history of other endocrine, nutritional and metabolic disease: Secondary | ICD-10-CM | POA: Insufficient documentation

## 2015-05-27 DIAGNOSIS — Z720 Tobacco use: Secondary | ICD-10-CM | POA: Insufficient documentation

## 2015-05-27 DIAGNOSIS — Y998 Other external cause status: Secondary | ICD-10-CM | POA: Insufficient documentation

## 2015-05-27 DIAGNOSIS — Z79899 Other long term (current) drug therapy: Secondary | ICD-10-CM | POA: Insufficient documentation

## 2015-05-27 DIAGNOSIS — G43909 Migraine, unspecified, not intractable, without status migrainosus: Secondary | ICD-10-CM | POA: Insufficient documentation

## 2015-05-27 DIAGNOSIS — Y9389 Activity, other specified: Secondary | ICD-10-CM | POA: Insufficient documentation

## 2015-05-27 MED ORDER — IBUPROFEN 400 MG PO TABS
400.0000 mg | ORAL_TABLET | Freq: Once | ORAL | Status: AC
  Administered 2015-05-27: 400 mg via ORAL
  Filled 2015-05-27: qty 1

## 2015-05-27 MED ORDER — OXYCODONE-ACETAMINOPHEN 5-325 MG PO TABS: ORAL_TABLET | ORAL | Status: DC

## 2015-05-27 MED ORDER — OXYCODONE-ACETAMINOPHEN 5-325 MG PO TABS
1.0000 | ORAL_TABLET | Freq: Once | ORAL | Status: AC
  Administered 2015-05-27: 1 via ORAL
  Filled 2015-05-27: qty 1

## 2015-05-27 NOTE — ED Provider Notes (Signed)
CSN: [id number]     Arrival date & time 05/27/15  1759 History   First MD Initiated Contact with Patient 05/27/15 1828     Chief Complaint  Patient presents with  . Ankle Pain     (Consider location/radiation/quality/duration/timing/severity/associated sxs/prior Treatment) HPI  Blood pressure 106/73, pulse 97, temperature 98.2 F (36.8 C), temperature source Oral, resp. rate 18, height 5\' 1"  (1.549 m), weight 140 lb (63.504 kg), last menstrual period 05/06/2015, SpO2 96 %.  Christine Campbell is a 39 y.o. female complaining of severe pain to right lateral ankle after rolling it when she slipped on wet cement well at a pole earlier in the afternoon. Patient states that she had a remote fracture to this ankle, she heard a pop when she fell today she heard a similar pop when she broke the ankle in the past. She did not require any surgery at that time. She has not been able to ambulate secondary to pain.  Past Medical History  Diagnosis Date  . Migraine   . Seizures   . Brain injury   . Asthma   . Bronchitis, chronic   . Hypoglycemia    Past Surgical History  Procedure Laterality Date  . Tubal ligation    . Traumatic brain injury    . Video bronchoscopy Bilateral 11/23/2013    Procedure: VIDEO BRONCHOSCOPY WITH FLUORO;  Surgeon: Rigoberto Noel, MD;  Location: [place];  Service: Cardiopulmonary;  Laterality: Bilateral;   Family History  Problem Relation Age of Onset  . Hypertension Mother   . Diabetes Mother   . Heart disease Maternal Uncle    History  Substance Use Topics  . Smoking status: Light Tobacco Smoker -- 0.50 packs/day for 24 years    Types: Cigarettes  . Smokeless tobacco: Never Used  . Alcohol Use: No   OB History    Gravida Para Term Preterm AB TAB SAB Ectopic Multiple Living   6 3 3  0 3 1 1   3      Review of Systems  10 systems reviewed and found to be negative, except as noted in the HPI.   Allergies  Cephalosporins; Penicillins; Erythromycin;  Tramadol; and Vicodin  Home Medications   Prior to Admission medications   Medication Sig Start Date End Date Taking? Authorizing Provider  albuterol (PROVENTIL HFA;VENTOLIN HFA) 108 (90 BASE) MCG/ACT inhaler Inhale 2 puffs into the lungs every 4 (four) hours as needed for wheezing or shortness of breath. 11/24/13   Shanker Kristeen Mans, MD  clindamycin (CLEOCIN) 150 MG capsule Take 2 capsules (300 mg total) by mouth every 6 (six) hours. 02/17/15   Tatyana Kirichenko, PA-C  gabapentin (NEURONTIN) 300 MG capsule Take 300 mg by mouth 3 (three) times daily.    Historical Provider, MD  ibuprofen (ADVIL,MOTRIN) 600 MG tablet Take 1 tablet (600 mg total) by mouth every 6 (six) hours as needed. 02/17/15   Tatyana Kirichenko, PA-C  methocarbamol (ROBAXIN) 500 MG tablet Take 1 tablet (500 mg total) by mouth 2 (two) times daily. 02/03/15   Ezequiel Essex, MD  oxyCODONE-acetaminophen (PERCOCET/ROXICET) 5-325 MG per tablet 1 to 2 tabs PO q6hrs  PRN for pain 05/27/15   Elmyra Ricks Persephone Schriever, PA-C  phenytoin (DILANTIN) 100 MG ER capsule Take 2 capsules (200 mg total) by mouth 2 (two) times daily. 02/03/15   Ezequiel Essex, MD  Tiotropium Bromide Monohydrate (SPIRIVA RESPIMAT) 2.5 MCG/ACT AERS Inhale 2 puffs into the lungs daily. Patient taking differently: Inhale 2 puffs into the lungs daily as  needed (for shortness of breath).  08/30/14   Rigoberto Noel, MD   BP 106/73 mmHg  Pulse 97  Temp(Src) 98.2 F (36.8 C) (Oral)  Resp 18  Ht 5\' 1"  (1.549 m)  Wt 140 lb (63.504 kg)  BMI 26.47 kg/m2  SpO2 96%  LMP 05/06/2015 Physical Exam  Constitutional: She is oriented to person, place, and time. She appears well-developed and well-nourished. No distress.  HENT:  Head: Normocephalic and atraumatic.  Mouth/Throat: Oropharynx is clear and moist.  Eyes: Conjunctivae and EOM are normal.  Neck: Normal range of motion.  Cardiovascular: Normal rate, regular rhythm and intact distal pulses.   Pulmonary/Chest: Effort normal and  breath sounds normal. No stridor. No respiratory distress. She has no wheezes. She has no rales. She exhibits no tenderness.  Abdominal: Soft.  Musculoskeletal: Normal range of motion. She exhibits edema and tenderness.  Left ankle:  No deformity, no overlying skin changes, mild swelling and tenderness to palpation along the inferior, lateral malleolus. No bony tenderness palpation, distally neurovascularly intact.   Neurological: She is alert and oriented to person, place, and time.  Psychiatric: She has a normal mood and affect.  Nursing note and vitals reviewed.   ED Course  Procedures (including critical care time) Labs Review Labs Reviewed - No data to display  Imaging Review Dg Ankle Complete Right  05/27/2015   CLINICAL DATA:  Slipped on wet for today, hearing a pop. Swelling anteriorly and medially.  EXAM: RIGHT ANKLE - COMPLETE 3+ VIEW  COMPARISON:  None.  FINDINGS: There is no evidence of fracture, dislocation, or joint effusion. There is no evidence of arthropathy or other focal bone abnormality. Soft tissues are unremarkable.  IMPRESSION: Negative.   Electronically Signed   By: Nelson Chimes M.D.   On: 05/27/2015 18:25     EKG Interpretation None      MDM   Final diagnoses:  Right ankle sprain, initial encounter   Filed Vitals:   05/27/15 1803  BP: 106/73  Pulse: 97  Temp: 98.2 F (36.8 C)  TempSrc: Oral  Resp: 18  Height: 5\' 1"  (1.549 m)  Weight: 140 lb (63.504 kg)  SpO2: 96%    Medications  ibuprofen (ADVIL,MOTRIN) tablet 400 mg (400 mg Oral Given 05/27/15 1901)  oxyCODONE-acetaminophen (PERCOCET/ROXICET) 5-325 MG per tablet 1 tablet (1 tablet Oral Given 05/27/15 1901)    Christine Campbell is a pleasant 39 y.o. female presenting with ankle pain after slip and fall. ankle pain. Patient is ambulatory. No point tenderness or bony tenderness to palpation. Neurovascularly intact. X-rays negative. Patient will be given crutches, Ace wrap, and advised to maintain  RICE. She works as a Educational psychologist, she is concerned that she will lose her job if she has to use crutches. I provided her work note with 2 days off and restrictions that she must be able to use her crutches for 1 week after she returns.  Evaluation does not show pathology that would require ongoing emergent intervention or inpatient treatment. Pt is hemodynamically stable and mentating appropriately. Discussed findings and plan with patient/guardian, who agrees with care plan. All questions answered. Return precautions discussed and outpatient follow up given.   New Prescriptions   OXYCODONE-ACETAMINOPHEN (PERCOCET/ROXICET) 5-325 MG PER TABLET    1 to 2 tabs PO q6hrs  PRN for pain        Monico Blitz, PA-C 05/27/15 1906  Pattricia Boss, MD 05/27/15 2249

## 2015-05-27 NOTE — Discharge Instructions (Signed)
Rest, Ice intermittently (in the first 24-48 hours), Gentle compression with an Ace wrap, and elevate (Limb above the level of the heart)   Take up to 800mg  of ibuprofen (that is usually 4 over the counter pills)  3 times a day for 5 days. Take with food.   Take percocet for breakthrough pain, do not drink alcohol, drive, care for children or do other critical tasks while taking percocet.   Do not hesitate to return to the emergency room for any new, worsening or concerning symptoms.  Please obtain primary care using resource guide below. Let them know that you were seen in the emergency room and that they will need to obtain records for further outpatient management.   Ankle Sprain An ankle sprain is an injury to the strong, fibrous tissues (ligaments) that hold the bones of your ankle joint together.  CAUSES An ankle sprain is usually caused by a fall or by twisting your ankle. Ankle sprains most commonly occur when you step on the outer edge of your foot, and your ankle turns inward. People who participate in sports are more prone to these types of injuries.  SYMPTOMS   Pain in your ankle. The pain may be present at rest or only when you are trying to stand or walk.  Swelling.  Bruising. Bruising may develop immediately or within 1 to 2 days after your injury.  Difficulty standing or walking, particularly when turning corners or changing directions. DIAGNOSIS  Your caregiver will ask you details about your injury and perform a physical exam of your ankle to determine if you have an ankle sprain. During the physical exam, your caregiver will press on and apply pressure to specific areas of your foot and ankle. Your caregiver will try to move your ankle in certain ways. An X-ray exam may be done to be sure a bone was not broken or a ligament did not separate from one of the bones in your ankle (avulsion fracture).  TREATMENT  Certain types of braces can help stabilize your ankle. Your  caregiver can make a recommendation for this. Your caregiver may recommend the use of medicine for pain. If your sprain is severe, your caregiver may refer you to a surgeon who helps to restore function to parts of your skeletal system (orthopedist) or a physical therapist. [place] ice to your injury for 1-2 days or as directed by your caregiver. Applying ice helps to reduce inflammation and pain.  Put ice in a plastic bag.  Place a towel between your skin and the bag.  Leave the ice on for 15-20 minutes at a time, every 2 hours while you are awake.  Only take over-the-counter or prescription medicines for pain, discomfort, or fever as directed by your caregiver.  Elevate your injured ankle above the level of your heart as much as possible for 2-3 days.  If your caregiver recommends crutches, use them as instructed. Gradually put weight on the affected ankle. Continue to use crutches or a cane until you can walk without feeling pain in your ankle.  If you have a plaster splint, wear the splint as directed by your caregiver. Do not rest it on anything harder than a pillow for the first 24 hours. Do not put weight on it. Do not get it wet. You may take it off to take a shower or bath.  You may have been given an elastic bandage to wear around your ankle to provide support. If  the elastic bandage is too tight (you have numbness or tingling in your foot or your foot becomes cold and blue), adjust the bandage to make it comfortable.  If you have an air splint, you may blow more air into it or let air out to make it more comfortable. You may take your splint off at night and before taking a shower or bath. Wiggle your toes in the splint several times per day to decrease swelling. SEEK MEDICAL CARE IF:   You have rapidly increasing bruising or swelling.  Your toes feel extremely cold or you lose feeling in your foot.  Your pain is not relieved with medicine. SEEK  IMMEDIATE MEDICAL CARE IF:  Your toes are numb or blue.  You have severe pain that is increasing. MAKE SURE YOU:   Understand these instructions.  Will watch your condition.  Will get help right away if you are not doing well or get worse. Document Released: 11/03/2005 Document Revised: 07/28/2012 Document Reviewed: 11/15/2011 [facility] Patient Information 2015 [place], [place]. This information is not intended to replace advice given to you by your health care provider. Make sure you discuss any questions you have with your health care provider.   Emergency Department Resource Guide 1) Find a Doctor and Pay Out of Pocket Although you won't have to find out who is covered by your insurance plan, it is a good idea to ask around and get recommendations. You will then need to call the office and see if the doctor you have chosen will accept you as a new patient and what types of options they offer for patients who are self-pay. Some doctors offer discounts or will set up payment plans for their patients who do not have insurance, but you will need to ask so you aren't surprised when you get to your appointment.  2) Contact Your Local Health Department Not all health departments have doctors that can see patients for sick visits, but many do, so it is worth a call to see if yours does. If you don't know where your local health department is, you can check in your phone book. The CDC also has a tool to help you locate your state's health department, and many state websites also have listings of all of their local health departments.  3) Find a [facility] If your illness is not likely to be very severe or complicated, you may want to try a walk in clinic. These are popping up all over the country in pharmacies, drugstores, and shopping centers. They're usually staffed by nurse practitioners or physician assistants that have been trained to treat common illnesses and complaints. They're usually  fairly quick and inexpensive. However, if you have serious medical issues or chronic medical problems, these are probably not your best option.  No Primary Care Doctor: - Call Health Connect at  [phone] - they can help you locate a primary care doctor that  accepts your insurance, provides certain services, etc. - Physician Referral Service- [phone]  Chronic Pain Problems: Organization         Address  Phone   Notes  [facility]  [phone] Patients need to be referred by their primary care doctor.   Medication Assistance: Organization         Address  Phone   Notes  [facility] [address] [place],  [address] [phone] --Must be a resident of [facility] -- Must have NO  insurance coverage whatsoever (no Medicaid/ Medicare, etc.) -- The pt. MUST have a primary care doctor that directs their care regularly and follows them in the community   MedAssist  [phone]   [employer]  [phone]    Agencies that provide inexpensive medical care: Organization         Address  Phone   Notes  [place]  [phone]   [facility]    [phone]   [facility] [place], [place] [address] [phone]   [place] [address] [place] [phone]   Planned Parenthood    [phone]   [facility]    [phone]   [place] and [place] [address], [place] Phone:  [phone], Fax:  [phone] Hours of Operation:  9 am - 6 pm, M-F.  Also accepts Medicaid/Medicare and self-pay.  [facility] for [place] [address], [address], [place] Phone: [phone], Fax: [phone]. Hours of Operation:  8:30 am - 5:30 pm, M-F.  Also accepts Medicaid and self-pay.  [facility] [place] [address] [place] Phone: [phone]   [place], [place], [place] [phone], Ext. [phone] Mondays & Thursdays: 7-9 AM.  First 15 patients are seen on a first come, first serve basis.    [place] Providers:  Organization         Address  Phone   Notes  [facility] [address], [place] [phone] Also accepts self-pay patients.  [facility] [address], [place]  [phone]   [place], [address], [place] [phone]   [facility] [address], [place] [phone]   Lucianne Lei [address], [place]   [phone] Only accepts [place] Access [place] patients after they have their name applied to their card.   Self-Pay (no insurance) in [facility]:  Organization         Address  Phone   Notes  Sickle Cell Patients, [facility] [place] [phone]   [facility] Urgent Care [place] [phone]   [facility] [place]  [place], [place], [place] [phone]   [facility]/Dr. Osei-Bonsu  [address] [place] or [address], [place] [phone] Phone number for both [place] and [place] locations is the same.  Urgent Medical and [facility] [address] [place] [phone]   [facility] [address] [place] or [address] [phone]   [facility] [address] [place] [phone], phone; [phone], fax Sees patients 1st and 3rd Saturday of every month.  Must not qualify for public or private insurance (i.e. Medicaid, Medicare, [place] Health Choice, Veterans' Benefits)  Household income should be no more than 200% of the poverty level The clinic cannot treat you if  you are pregnant or think you are pregnant  Sexually transmitted diseases are not treated at the clinic.    Dental Care: Organization         Address  Phone  Notes  [facility]  Department of [facility] [address] [place] [phone] Accepts children up to age 65 who are enrolled in [place] or [place]; pregnant women with a Medicaid card; and children who have applied for Medicaid or [place] Health Choice, but were declined, whose parents can pay a reduced fee at time of service.  [facility] of [facility]  [address], [place] [phone] Accepts children up to age 26 who are enrolled in [place] or [place]; pregnant women with a Medicaid card; and children who have applied for Medicaid or [place] Health Choice, but were declined, whose parents can pay a reduced fee at time of service.  [place] Adult Dental Access PROGRAM  [place] [phone] Patients are seen by appointment only. Walk-ins are not accepted. [place] will see patients 26 years of age and older. Monday - Tuesday (8am-5pm) Most Wednesdays (8:30-5pm) $30 per visit, cash only  [facility] Adult Dental Access PROGRAM  [address], [facility] [phone] Patients are seen by appointment only. Walk-ins are not accepted. [place] will see patients 48 years of age and older. One Wednesday Evening (Monthly: Volunteer Based).  $30 per visit, cash only  [place]  [phone] for adults; Children under age 19, call Graduate Pediatric Dentistry at [phone]. Children aged 47-14, please call [phone] to request a pediatric application.  Dental services are provided in all areas of dental care including fillings, crowns and bridges, complete and partial dentures, implants, gum treatment, root canals, and extractions. Preventive care is also provided. Treatment is  provided to both adults and children. Patients are selected via a lottery and there is often a waiting list.   [facility] [address] [place]  [phone] [email or web address]   [facility] [address] [place], [place] [phone], Ext. [phone] Second and Fourth Thursday of each month, opens at 6:30 AM; Clinic ends at 9 AM.  Patients are seen on a first-come first-served basis, and a limited number are seen during each clinic.   [facility]  [address] Hillard Danker [place], [place] [phone]   Eligibility Requirements You must have lived in [place], [place], or [place] counties for at least the last three months.   You cannot be eligible for state or federal sponsored [employer], including [employer], [place], or [employer].   You generally cannot be eligible for healthcare insurance through your employer.    How to apply: Eligibility screenings are held every Tuesday and Wednesday afternoon from 1:00 pm until 4:00 pm. You do not need an appointment for the interview!  [facility] [address], [place], [place]   [place]  [place] Department  [place]  [phone]    Behavioral Health Resources in the Community: Intensive Outpatient Programs Organization         Address  Phone  Notes  [place]. [address], [place], [place] [phone]   [facility] Outpatient [address] [address], [place], [place]   ADS: Alcohol & Drug Svcs [address], [place], [place]   [place] [address]  [place], [place] or [phone]   Substance Abuse Resources Organization  Address  Phone  Notes  Alcohol and Drug Services  [phone]   [place]  [phone]   The  [place]  [phone]   Chinita Pester  [phone]   Residential & Outpatient Substance Abuse Program  [phone]   Psychological Services Organization         Address  Phone  Notes  [facility] [place]  Ewa Gentry  [phone]   [place] [address] [place] or [phone]    [place] Crisis Teams Organization         Address  Phone  Notes  [facility], Mobile Crisis Care Unit  [phone]   [facility]  [address] [place], [place]   [place] [address] [place] [phone]    Self-Help/Support Groups Organization         Address  Phone             Notes  [place]. of [place] - variety of support groups  [place] Call for more information  Narcotics Anonymous (NA), Caring Services [address], [employer] [place]  2 meetings at this location   Special educational needs teacher         Address  Phone  Notes  [facility]AP Residential Treatment [place],    [place]  [phone]   [facility]  [address], [place] [address], [place], [place]   [place], [place] [phone] Admissions: 8am-3pm M-F  Incentives Substance [place] [address]    [place], [place] [phone]   [facility] [address] [place], [place], [place]   [facility] [address]  [place], [place]   [facility]ight Programs - Intensive Outpatient [address] Kristeen Mans 21, [place], [place]   [facility] ([place].,  [place], [place] [phone] or [phone]   Residential Treatment Services (RTS) [address] [place], [place] Accepts Medicaid  [place] [address],  [place] [phone] Substance Abuse/Addiction Treatment     [facility] [place] [facility] [place] Organization         Address  Phone  Notes  [facility]  [phone]   Domenic Schwab, PhD [address] Arlis Porta [place], [place]   [phone] or [phone]   [place], [place] [phone]   [facility] [address] [place], [place] [phone] Insurance/Medicaid/sponsorship through [facility] and Families [address], [address] [place]                                    [place], [place] [phone] [place] [address], [place] [phone]    Dr. Adele Schilder  [phone]   [facility] Dept. 1) [address] [place] 2) [place] 3)  [place] [address], [place] [phone]  [phone]   [place] [phone] or [phone] (After Hours)

## 2015-05-27 NOTE — ED Notes (Signed)
Pt states fell approx 1530hrs today on wet cement at the pool today, states pain is worse when trying to walk on rt foot

## 2015-05-27 NOTE — ED Notes (Signed)
Pt in c/o falling and rolling ankle. No noted swelling or ecchymosis.

## 2015-07-16 ENCOUNTER — Encounter (HOSPITAL_BASED_OUTPATIENT_CLINIC_OR_DEPARTMENT_OTHER): Payer: Self-pay | Admitting: *Deleted

## 2015-07-16 ENCOUNTER — Emergency Department (HOSPITAL_BASED_OUTPATIENT_CLINIC_OR_DEPARTMENT_OTHER)
Admission: EM | Admit: 2015-07-16 | Discharge: 2015-07-16 | Disposition: A | Discharge status: Home / Self Care | Payer: Self-pay | Attending: Emergency Medicine | Admitting: Emergency Medicine

## 2015-07-16 DIAGNOSIS — Z720 Tobacco use: Secondary | ICD-10-CM | POA: Insufficient documentation

## 2015-07-16 DIAGNOSIS — G40909 Epilepsy, unspecified, not intractable, without status epilepticus: Secondary | ICD-10-CM | POA: Insufficient documentation

## 2015-07-16 DIAGNOSIS — J45909 Unspecified asthma, uncomplicated: Secondary | ICD-10-CM | POA: Insufficient documentation

## 2015-07-16 DIAGNOSIS — Z87820 Personal history of traumatic brain injury: Secondary | ICD-10-CM | POA: Insufficient documentation

## 2015-07-16 DIAGNOSIS — K088 Other specified disorders of teeth and supporting structures: Secondary | ICD-10-CM | POA: Insufficient documentation

## 2015-07-16 DIAGNOSIS — G43909 Migraine, unspecified, not intractable, without status migrainosus: Secondary | ICD-10-CM | POA: Insufficient documentation

## 2015-07-16 DIAGNOSIS — Z79899 Other long term (current) drug therapy: Secondary | ICD-10-CM | POA: Insufficient documentation

## 2015-07-16 DIAGNOSIS — Z880 Allergy status to penicillin: Secondary | ICD-10-CM | POA: Insufficient documentation

## 2015-07-16 DIAGNOSIS — K0889 Other specified disorders of teeth and supporting structures: Secondary | ICD-10-CM

## 2015-07-16 MED ORDER — IBUPROFEN 600 MG PO TABS: 600.0000 mg | ORAL_TABLET | Freq: Four times a day (QID) | ORAL | Status: DC | PRN

## 2015-07-16 MED ORDER — CLINDAMYCIN HCL 300 MG PO CAPS: 300.0000 mg | ORAL_CAPSULE | Freq: Four times a day (QID) | ORAL | Status: DC

## 2015-07-16 NOTE — ED Provider Notes (Signed)
CSN: [id number]     Arrival date & time 07/16/15  1219 History   First MD Initiated Contact with Patient 07/16/15 1237     Chief Complaint  Patient presents with  . Dental Pain     (Consider location/radiation/quality/duration/timing/severity/associated sxs/prior Treatment) HPI Comments: Patient presents with dental pain. She states she's had ongoing problems with her teeth. Over the last 3 weeks she's had increased pain to her left upper teeth. She also has pain in her right jaw. She states she broke her jaw several years ago and thinks it might be related to that. She denies any facial swelling. She states it hurts to eat. She denies any fevers or nausea. She has a not been able to get in to see a dentist due to lack of insurance.  Patient is a 39 y.o. female presenting with tooth pain.  Dental Pain Associated symptoms: no fever, no headaches and no neck pain     Past Medical History  Diagnosis Date  . Migraine   . Seizures   . Brain injury   . Asthma   . Bronchitis, chronic   . Hypoglycemia    Past Surgical History  Procedure Laterality Date  . Tubal ligation    . Traumatic brain injury    . Video bronchoscopy Bilateral 11/23/2013    Procedure: VIDEO BRONCHOSCOPY WITH FLUORO;  Surgeon: Rigoberto Noel, MD;  Location: [facility];  Service: Cardiopulmonary;  Laterality: Bilateral;   Family History  Problem Relation Age of Onset  . Hypertension Mother   . Diabetes Mother   . Heart disease Maternal Uncle    Social History  Substance Use Topics  . Smoking status: Light Tobacco Smoker -- 0.50 packs/day for 24 years    Types: Cigarettes  . Smokeless tobacco: Never Used  . Alcohol Use: No   OB History    Gravida Para Term Preterm AB TAB SAB Ectopic Multiple Living   6 3 3  0 3 1 1   3      Review of Systems  Constitutional: Negative for fever and fatigue.  HENT: Positive for dental problem.   Gastrointestinal: Negative for nausea and vomiting.  Musculoskeletal: Negative  for neck pain.  Skin: Negative for rash.  Neurological: Negative for light-headedness and headaches.      Allergies  Cephalosporins; Penicillins; Erythromycin; Tramadol; and Vicodin  Home Medications   Prior to Admission medications   Medication Sig Start Date End Date Taking? Authorizing Provider  albuterol (PROVENTIL HFA;VENTOLIN HFA) 108 (90 BASE) MCG/ACT inhaler Inhale 2 puffs into the lungs every 4 (four) hours as needed for wheezing or shortness of breath. 11/24/13   Shanker Kristeen Mans, MD  clindamycin (CLEOCIN) 300 MG capsule Take 1 capsule (300 mg total) by mouth 4 (four) times daily. X 7 days 07/16/15   [name], MD  gabapentin (NEURONTIN) 300 MG capsule Take 300 mg by mouth 3 (three) times daily.    Historical Provider, MD  ibuprofen (ADVIL,MOTRIN) 600 MG tablet Take 1 tablet (600 mg total) by mouth every 6 (six) hours as needed. 07/16/15   [name], MD  methocarbamol (ROBAXIN) 500 MG tablet Take 1 tablet (500 mg total) by mouth 2 (two) times daily. 02/03/15   Ezequiel Essex, MD  oxyCODONE-acetaminophen (PERCOCET/ROXICET) 5-325 MG per tablet 1 to 2 tabs PO q6hrs  PRN for pain 05/27/15   Elmyra Ricks Pisciotta, PA-C  phenytoin (DILANTIN) 100 MG ER capsule Take 2 capsules (200 mg total) by mouth 2 (two) times daily. 02/03/15   Annie Main  Rancour, MD  Tiotropium Bromide Monohydrate (SPIRIVA RESPIMAT) 2.5 MCG/ACT AERS Inhale 2 puffs into the lungs daily. Patient taking differently: Inhale 2 puffs into the lungs daily as needed (for shortness of breath).  08/30/14   Rigoberto Noel, MD   BP 143/84 mmHg  Pulse 90  Temp(Src) 98 F (36.7 C) (Oral)  Resp 20  Ht 5\' 1"  (1.549 m)  Wt 140 lb (63.504 kg)  BMI 26.47 kg/m2  SpO2 98%  LMP 06/25/2015 Physical Exam  Constitutional: She is oriented to person, place, and time. She appears well-developed and well-nourished.  HENT:  Positive multiple decayed or missing teeth. She has tenderness along the eroded teeth involving the left upper incisor  and bicuspid. There is no induration or fluctuance, no facial swelling, no trismus. She also has some tenderness to the TMJ on the right. There is no swelling or deformity noted. There is no malocclusion noted.  Cardiovascular: Normal rate and normal heart sounds.   Pulmonary/Chest: Effort normal.  Neurological: She is alert and oriented to person, place, and time.  Skin: Skin is warm and dry.    ED Course  Procedures (including critical care time) Labs Review Labs Reviewed - No data to display  Imaging Review No results found. I have personally reviewed and evaluated these images and lab results as part of my medical decision-making.   EKG Interpretation None      MDM   Final diagnoses:  Pain, dental    Patient was offered a dental injection but she refused. She was given prescriptions for clindamycin and ibuprofen. She was encouraged to follow-up with the dentist as an outpatient.    [name], MD 07/16/15 1322

## 2015-07-16 NOTE — ED Notes (Signed)
Dental pain x 3 weeks. States pain is making her right ear hurt.

## 2015-07-16 NOTE — Discharge Instructions (Signed)

## 2015-07-16 NOTE — ED Notes (Signed)
States does have some bad teeth  But now the right side of her face:ear forehead and jaw all hurt.  Onset x 3 weeks

## 2015-09-30 IMAGING — DX DG CHEST 2V
2 series · 2 of 2 positions shown · non-contrast
Comparison: PA and lateral chest of August 16, 2014

CLINICAL DATA: Two weeks of cough and congestion, history of
tobacco use, COPD

EXAM:
CHEST  2 VIEW

[chest pa]
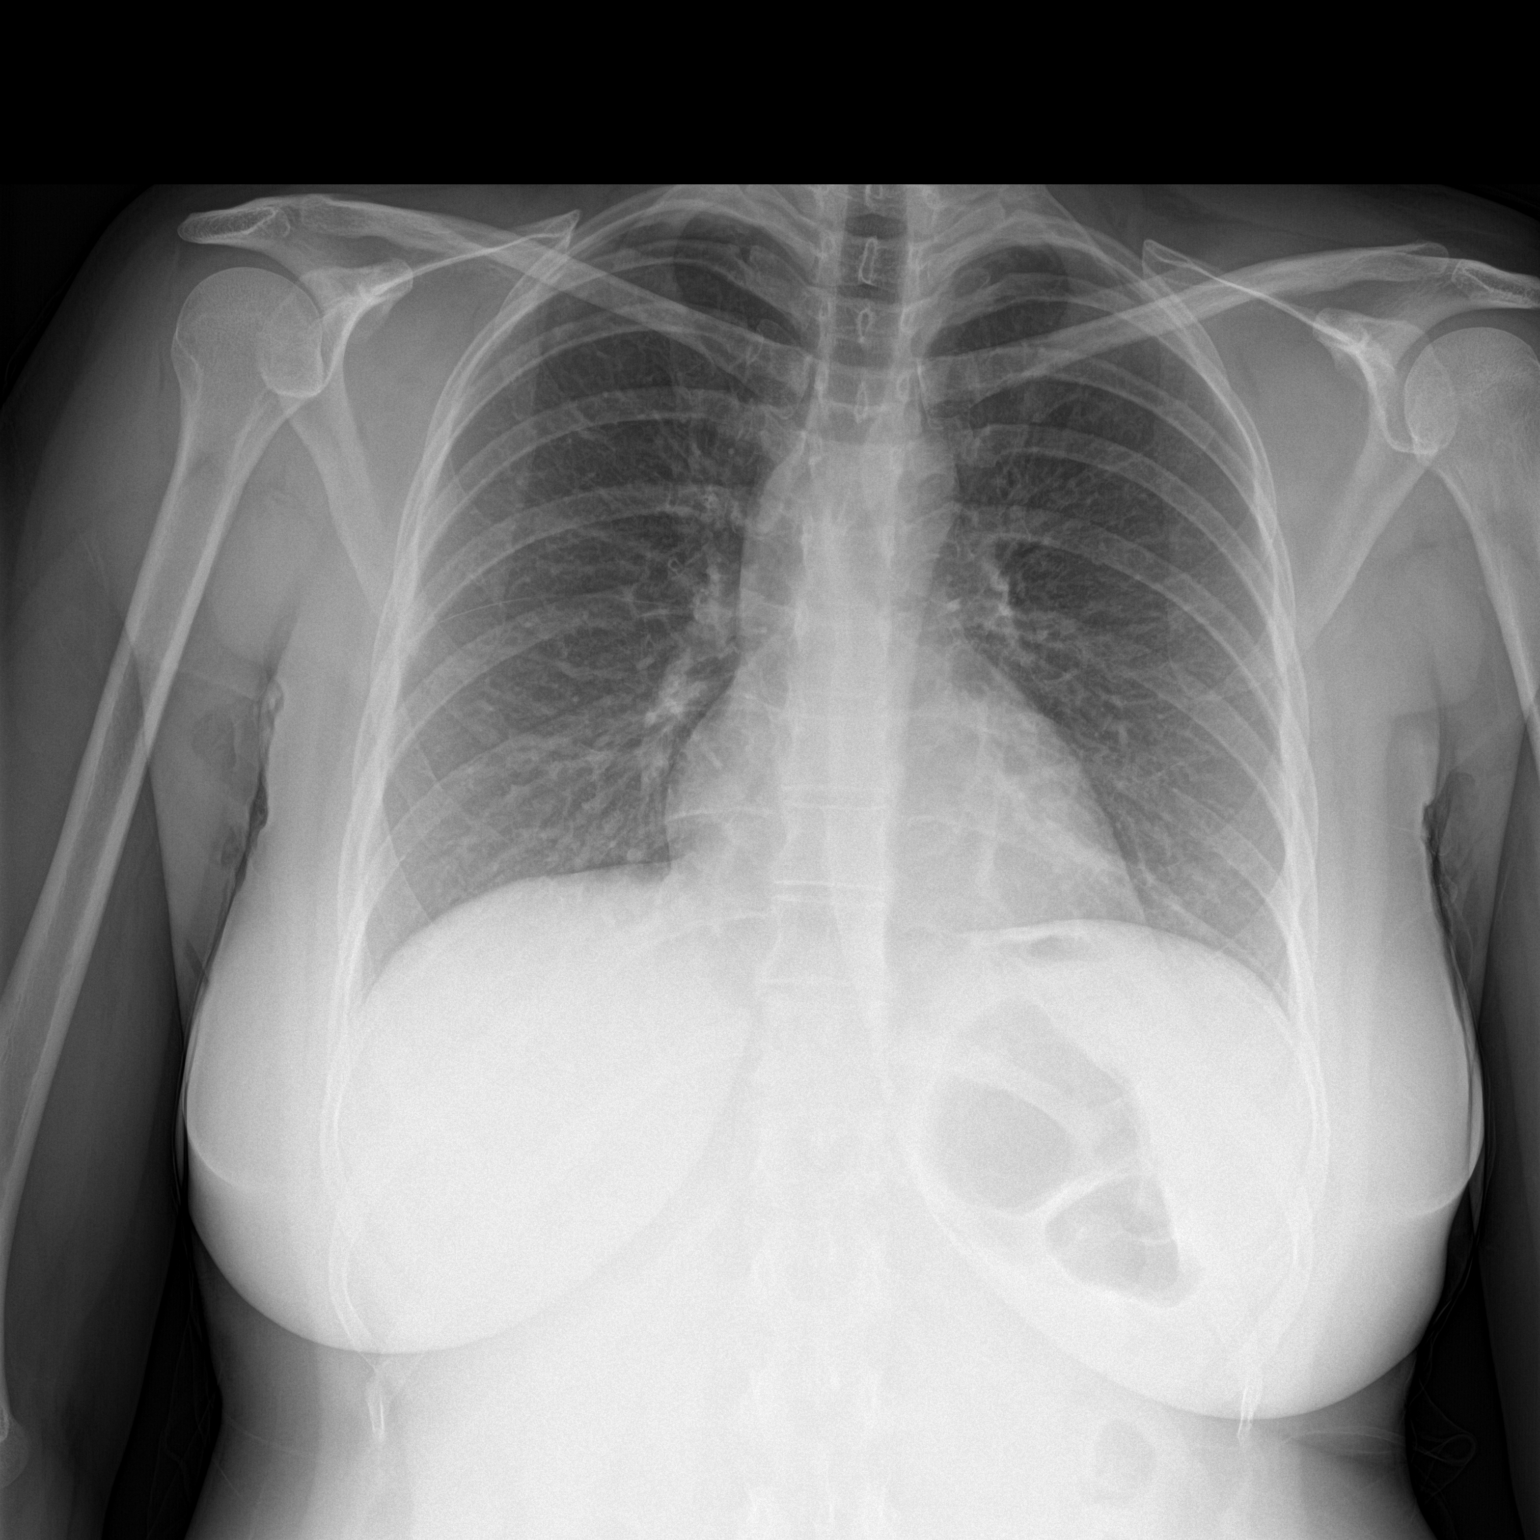

[chest lat]
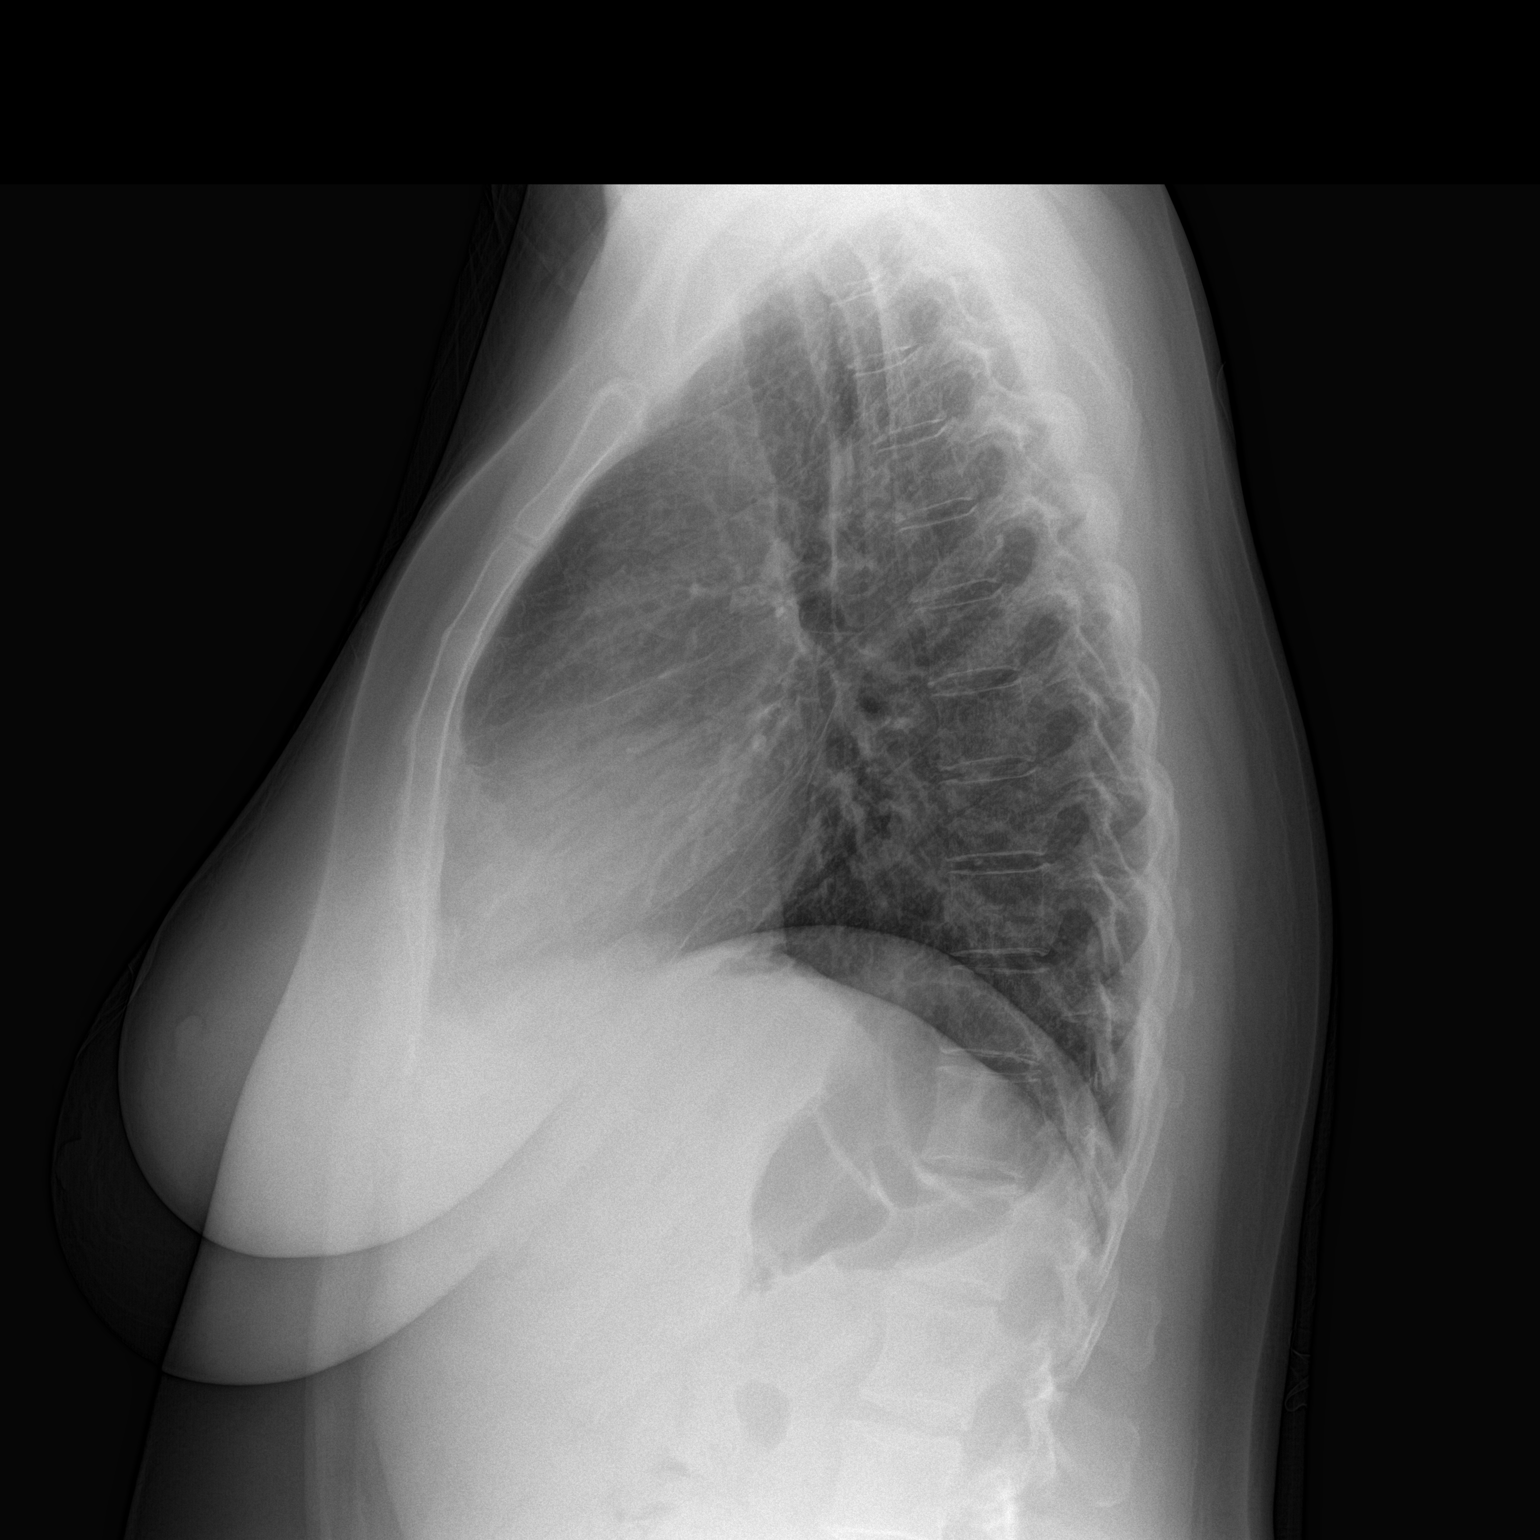

[2 of 2 positions shown; findings below may reference images not displayed]

FINDINGS: The lungs are adequately inflated and clear. The interstitial
markings are coarse but stable. The heart and pulmonary vascularity
are normal. The mediastinum is normal in width. There is no pleural
effusion. The bony thorax is unremarkable.
IMPRESSION: There is no active cardiopulmonary disease.

## 2015-12-01 ENCOUNTER — Emergency Department (HOSPITAL_BASED_OUTPATIENT_CLINIC_OR_DEPARTMENT_OTHER)
Admission: EM | Admit: 2015-12-01 | Discharge: 2015-12-01 | Disposition: A | Discharge status: Home / Self Care | Payer: BLUE CROSS/BLUE SHIELD | Attending: Emergency Medicine | Admitting: Emergency Medicine

## 2015-12-01 ENCOUNTER — Encounter (HOSPITAL_BASED_OUTPATIENT_CLINIC_OR_DEPARTMENT_OTHER): Payer: Self-pay | Admitting: *Deleted

## 2015-12-01 DIAGNOSIS — K0889 Other specified disorders of teeth and supporting structures: Secondary | ICD-10-CM | POA: Diagnosis present

## 2015-12-01 DIAGNOSIS — G43909 Migraine, unspecified, not intractable, without status migrainosus: Secondary | ICD-10-CM | POA: Insufficient documentation

## 2015-12-01 DIAGNOSIS — Z8639 Personal history of other endocrine, nutritional and metabolic disease: Secondary | ICD-10-CM | POA: Insufficient documentation

## 2015-12-01 DIAGNOSIS — Z880 Allergy status to penicillin: Secondary | ICD-10-CM | POA: Insufficient documentation

## 2015-12-01 DIAGNOSIS — Z79899 Other long term (current) drug therapy: Secondary | ICD-10-CM | POA: Insufficient documentation

## 2015-12-01 DIAGNOSIS — J45909 Unspecified asthma, uncomplicated: Secondary | ICD-10-CM | POA: Diagnosis not present

## 2015-12-01 DIAGNOSIS — F17210 Nicotine dependence, cigarettes, uncomplicated: Secondary | ICD-10-CM | POA: Diagnosis not present

## 2015-12-01 DIAGNOSIS — Z87820 Personal history of traumatic brain injury: Secondary | ICD-10-CM | POA: Diagnosis not present

## 2015-12-01 DIAGNOSIS — K029 Dental caries, unspecified: Secondary | ICD-10-CM | POA: Diagnosis not present

## 2015-12-01 MED ORDER — CLINDAMYCIN HCL 300 MG PO CAPS: 300.0000 mg | ORAL_CAPSULE | Freq: Four times a day (QID) | ORAL | Status: DC

## 2015-12-01 NOTE — ED Provider Notes (Signed)
CSN: [id number]     Arrival date & time 12/01/15  1054 History   First MD Initiated Contact with Patient 12/01/15 1111     Chief Complaint  Patient presents with  . Dental Pain     (Consider location/radiation/quality/duration/timing/severity/associated sxs/prior Treatment) HPI  40 year old female presents with right mandibular dental pain that started at 3 AM. Has been unable to go back to sleep since then due to the severity of pain. Patient has tried ibuprofen with no relief. Denies fevers, facial swelling, trouble swallowing, or trouble breathing. Patient rates her pain as severe. Has had issues with this tooth for some time and knows that she needs to get a cavity filled but has not been able to do so. No recent trauma.  Past Medical History  Diagnosis Date  . Migraine   . Seizures ([place])   . Brain injury ([place])   . Asthma   . Bronchitis, chronic ([place])   . Hypoglycemia    Past Surgical History  Procedure Laterality Date  . Tubal ligation    . Traumatic brain injury    . Video bronchoscopy Bilateral 11/23/2013    Procedure: VIDEO BRONCHOSCOPY WITH FLUORO;  Surgeon: Rigoberto Noel, MD;  Location: [facility];  Service: Cardiopulmonary;  Laterality: Bilateral;   Family History  Problem Relation Age of Onset  . Hypertension Mother   . Diabetes Mother   . Heart disease Maternal Uncle    Social History  Substance Use Topics  . Smoking status: Light Tobacco Smoker -- 0.50 packs/day for 24 years    Types: Cigarettes  . Smokeless tobacco: Never Used  . Alcohol Use: No   OB History    Gravida Para Term Preterm AB TAB SAB Ectopic Multiple Living   6 3 3  0 3 1 1   3      Review of Systems  Constitutional: Negative for fever.  HENT: Positive for dental problem. Negative for facial swelling, sore throat and trouble swallowing.   Respiratory: Negative for shortness of breath.   All other systems reviewed and are negative.     Allergies  Cephalosporins; Penicillins;  Erythromycin; Tramadol; and Vicodin  Home Medications   Prior to Admission medications   Medication Sig Start Date End Date Taking? Authorizing Provider  albuterol (PROVENTIL HFA;VENTOLIN HFA) 108 (90 BASE) MCG/ACT inhaler Inhale 2 puffs into the lungs every 4 (four) hours as needed for wheezing or shortness of breath. 11/24/13   Shanker Kristeen Mans, MD  clindamycin (CLEOCIN) 300 MG capsule Take 1 capsule (300 mg total) by mouth 4 (four) times daily. X 7 days 12/01/15   Sherwood Gambler, MD  gabapentin (NEURONTIN) 300 MG capsule Take 300 mg by mouth 3 (three) times daily.    Historical Provider, MD  ibuprofen (ADVIL,MOTRIN) 600 MG tablet Take 1 tablet (600 mg total) by mouth every 6 (six) hours as needed. 07/16/15   [name], MD  methocarbamol (ROBAXIN) 500 MG tablet Take 1 tablet (500 mg total) by mouth 2 (two) times daily. 02/03/15   Ezequiel Essex, MD  oxyCODONE-acetaminophen (PERCOCET/ROXICET) 5-325 MG per tablet 1 to 2 tabs PO q6hrs  PRN for pain 05/27/15   Elmyra Ricks Pisciotta, PA-C  phenytoin (DILANTIN) 100 MG ER capsule Take 2 capsules (200 mg total) by mouth 2 (two) times daily. 02/03/15   Ezequiel Essex, MD  Tiotropium Bromide Monohydrate (SPIRIVA RESPIMAT) 2.5 MCG/ACT AERS Inhale 2 puffs into the lungs daily. Patient taking differently: Inhale 2 puffs into the lungs daily as needed (for shortness of breath).  08/30/14   Rigoberto Noel, MD   BP 117/68 mmHg  Pulse 72  Temp(Src) 97.4 F (36.3 C) (Oral)  Resp 20  Ht 5\' 1"  (1.549 m)  Wt 145 lb (65.772 kg)  BMI 27.41 kg/m2  SpO2 100% Physical Exam  Constitutional: She is oriented to person, place, and time. She appears well-developed and well-nourished.  HENT:  Head: Normocephalic and atraumatic.  Right Ear: External ear normal.  Left Ear: External ear normal.  Nose: Nose normal.  Mouth/Throat: Abnormal dentition. Dental caries present. No dental abscesses.    No obvious dental abscess or gingival swelling. No swelling or oropharynx.  Multiple missing teeth  Eyes: Right eye exhibits no discharge. Left eye exhibits no discharge.  Neck: Normal range of motion. Neck supple.  Cardiovascular: Regular rhythm.   Pulmonary/Chest: Effort normal.  Abdominal: She exhibits no distension.  Neurological: She is alert and oriented to person, place, and time.  Skin: Skin is warm and dry.  Nursing note and vitals reviewed.   ED Course  Procedures (including critical care time) Labs Review Labs Reviewed - No data to display  Imaging Review No results found. I have personally reviewed and evaluated these images and lab results as part of my medical decision-making.   EKG Interpretation None      MDM   Final diagnoses:  Dental caries  Pain, dental    Patient with recurrent dental pain. She has a chronically appearing tooth in her right mandible. There is no significant swelling or obvious abscess. No signs of impending airway compromise or deep space infection. I discussed antibiotics as well as NSAIDs. Patient seems to want something stronger but I do not think narcotics are warranted in what appears to be an otherwise uncomplicated acute on chronic dental pain. She is requesting the dentist on call for follow-up.    Sherwood Gambler, MD 12/02/15 [phone]

## 2015-12-01 NOTE — ED Notes (Signed)
Pt states she awoke at 0300hrs with dental pain

## 2015-12-01 NOTE — ED Notes (Signed)
Presents with bottom right front tooth ache, states awoke at 0300hrs with pain

## 2015-12-01 NOTE — ED Notes (Signed)
DC instructions reviewed with pt, discussed importance of taking all of abx as prescribed by EDP, also non-phamacological pain control with ice pack and using ibuprofen OTC for pain. Also stressed importance of calling a local dentist for appointment. Opportunity for questions provided. Teach Back Method used

## 2016-05-14 ENCOUNTER — Encounter (HOSPITAL_BASED_OUTPATIENT_CLINIC_OR_DEPARTMENT_OTHER): Payer: Self-pay | Admitting: *Deleted

## 2016-05-14 ENCOUNTER — Emergency Department (HOSPITAL_BASED_OUTPATIENT_CLINIC_OR_DEPARTMENT_OTHER)
Admission: EM | Admit: 2016-05-14 | Discharge: 2016-05-14 | Disposition: A | Discharge status: Home / Self Care | Payer: BLUE CROSS/BLUE SHIELD | Attending: Emergency Medicine | Admitting: Emergency Medicine

## 2016-05-14 DIAGNOSIS — A599 Trichomoniasis, unspecified: Secondary | ICD-10-CM | POA: Insufficient documentation

## 2016-05-14 DIAGNOSIS — N390 Urinary tract infection, site not specified: Secondary | ICD-10-CM

## 2016-05-14 DIAGNOSIS — F17210 Nicotine dependence, cigarettes, uncomplicated: Secondary | ICD-10-CM | POA: Insufficient documentation

## 2016-05-14 LAB — URINALYSIS, ROUTINE W REFLEX MICROSCOPIC
Bilirubin Urine: NEGATIVE
GLUCOSE, UA: NEGATIVE mg/dL
KETONES UR: NEGATIVE mg/dL
NITRITE: NEGATIVE
PH: 5.5 (ref 5.0–8.0)
Protein, ur: 30 mg/dL — AB
Specific Gravity, Urine: 1.043 — ABNORMAL HIGH (ref 1.005–1.030)

## 2016-05-14 LAB — URINE MICROSCOPIC-ADD ON

## 2016-05-14 LAB — WET PREP, GENITAL
CLUE CELLS WET PREP: NONE SEEN
Sperm: NONE SEEN
YEAST WET PREP: NONE SEEN

## 2016-05-14 LAB — PREGNANCY, URINE: Preg Test, Ur: NEGATIVE

## 2016-05-14 MED ORDER — CIPROFLOXACIN HCL 500 MG PO TABS: 500.0000 mg | ORAL_TABLET | Freq: Two times a day (BID) | ORAL | Status: DC

## 2016-05-14 MED ORDER — METRONIDAZOLE 500 MG PO TABS: 500.0000 mg | ORAL_TABLET | Freq: Two times a day (BID) | ORAL | Status: DC

## 2016-05-14 MED ORDER — AZITHROMYCIN 1 G PO PACK
1.0000 g | PACK | Freq: Once | ORAL | Status: AC
  Administered 2016-05-14: 1 g via ORAL
  Filled 2016-05-14: qty 1

## 2016-05-14 NOTE — ED Notes (Signed)
Pt refused blood work after 2 attempts. MD made aware.

## 2016-05-14 NOTE — ED Notes (Signed)
Pt c/o vaginal discharge and itching x 1 week

## 2016-05-14 NOTE — ED Provider Notes (Signed)
CSN: [id number]     Arrival date & time 05/14/16  1531 History   First MD Initiated Contact with Patient 05/14/16 1704     Chief Complaint  Patient presents with  . Vaginal Discharge     (Consider location/radiation/quality/duration/timing/severity/associated sxs/prior Treatment) HPI Patient poor she's had some vaginal discharge and itching for approximately one week. No associated bowel pain. No fever no vomiting. She reports that she contracted Trichomonas from her partner approximately 2 years ago. She reports that they have used condoms since out but she is concerned that that might have happened again. Past Medical History  Diagnosis Date  . Migraine   . Seizures ([place])   . Brain injury ([place])   . Asthma   . Bronchitis, chronic ([place])   . Hypoglycemia    Past Surgical History  Procedure Laterality Date  . Tubal ligation    . Traumatic brain injury    . Video bronchoscopy Bilateral 11/23/2013    Procedure: VIDEO BRONCHOSCOPY WITH FLUORO;  Surgeon: Rigoberto Noel, MD;  Location: [facility];  Service: Cardiopulmonary;  Laterality: Bilateral;   Family History  Problem Relation Age of Onset  . Hypertension Mother   . Diabetes Mother   . Heart disease Maternal Uncle    Social History  Substance Use Topics  . Smoking status: Light Tobacco Smoker -- 0.50 packs/day for 24 years    Types: Cigarettes  . Smokeless tobacco: Never Used  . Alcohol Use: No   OB History    Gravida Para Term Preterm AB TAB SAB Ectopic Multiple Living   6 3 3  0 3 1 1   3      Review of Systems Constitutional: No fevers no chills no malaise GI: No vomiting, abdominal pain, diarrhea.   Allergies  Cephalosporins; Penicillins; Erythromycin; Tramadol; and Vicodin  Home Medications   Prior to Admission medications   Medication Sig Start Date End Date Taking? Authorizing Provider  albuterol (PROVENTIL HFA;VENTOLIN HFA) 108 (90 BASE) MCG/ACT inhaler Inhale 2 puffs into the lungs every 4 (four) hours as  needed for wheezing or shortness of breath. 11/24/13   Shanker Kristeen Mans, MD  ciprofloxacin (CIPRO) 500 MG tablet Take 1 tablet (500 mg total) by mouth 2 (two) times daily. One po bid x 7 days 05/14/16   Charlesetta Shanks, MD  gabapentin (NEURONTIN) 300 MG capsule Take 300 mg by mouth 3 (three) times daily.    Historical Provider, MD  metroNIDAZOLE (FLAGYL) 500 MG tablet Take 1 tablet (500 mg total) by mouth 2 (two) times daily. One po bid x 7 days 05/14/16   Charlesetta Shanks, MD  Tiotropium Bromide Monohydrate (SPIRIVA RESPIMAT) 2.5 MCG/ACT AERS Inhale 2 puffs into the lungs daily. Patient taking differently: Inhale 2 puffs into the lungs daily as needed (for shortness of breath).  08/30/14   Rigoberto Noel, MD   BP 138/97 mmHg  Pulse 72  Temp(Src) 98.7 F (37.1 C) (Oral)  Resp 16  Ht 5\' 1"  (1.549 m)  Wt 151 lb (68.493 kg)  BMI 28.55 kg/m2  SpO2 100%  LMP 05/07/2016 Physical Exam  Constitutional: She is oriented to person, place, and time. She appears well-developed and well-nourished. No distress.  Eyes: EOM are normal. No scleral icterus.  Cardiovascular: Normal rate, regular rhythm, normal heart sounds and intact distal pulses.   Pulmonary/Chest: Effort normal and breath sounds normal.  Abdominal: Soft. She exhibits no distension and no mass. There is no tenderness. There is no rebound and no guarding.  Genitourinary:  External female genitalia is normal. Speculum exam, copious yellow discharge in the vaginal vault. Mild cervical friability. No cervical motion tenderness and no uterine tenderness or axial tenderness to palpation.  Musculoskeletal: Normal range of motion. She exhibits no edema.  Neurological: She is alert and oriented to person, place, and time. Coordination normal.  Skin: Skin is warm and dry.  Psychiatric: She has a normal mood and affect.    ED Course  Procedures (including critical care time) Labs Review Labs Reviewed  WET PREP, GENITAL - Abnormal; Notable for the  following:    Trich, Wet Prep PRESENT (*)    WBC, Wet Prep HPF POC MANY (*)    All other components within normal limits  URINALYSIS, ROUTINE W REFLEX MICROSCOPIC (NOT AT [facility]) - Abnormal; Notable for the following:    Color, Urine AMBER (*)    APPearance TURBID (*)    Specific Gravity, Urine 1.043 (*)    Hgb urine dipstick MODERATE (*)    Protein, ur 30 (*)    Leukocytes, UA LARGE (*)    All other components within normal limits  URINE MICROSCOPIC-ADD ON - Abnormal; Notable for the following:    Squamous Epithelial / LPF 6-30 (*)    Bacteria, UA FEW (*)    All other components within normal limits  URINE CULTURE  PREGNANCY, URINE  GC/CHLAMYDIA PROBE AMP ([place]) NOT AT [facility]    Imaging Review No results found. I have personally reviewed and evaluated these images and lab results as part of my medical decision-making.   EKG Interpretation None      MDM   Final diagnoses:  Trichomonas infection  UTI (lower urinary tract infection)   Patient is nontoxic and alert. She is treated for Trichomonas and cervicitis. Patient has no reproducible lower abdominal pain. She reports severe penicillin allergy and cephalosporin allergy with throat closure. Treatment was given of 1 g Zithromax emergency department, prescriptions for ciprofloxacin and Flagyl for one weeks duration.    Charlesetta Shanks, MD 05/14/16 [phone]

## 2016-05-14 NOTE — Discharge Instructions (Signed)
Trichomoniasis Trichomoniasis is an infection caused by an organism called Trichomonas. The infection can affect both women and men. In women, the outer female genitalia and the vagina are affected. In men, the penis is mainly affected, but the prostate and other reproductive organs can also be involved. Trichomoniasis is a sexually transmitted infection (STI) and is most often passed to another person through sexual contact.  RISK FACTORS  Having unprotected sexual intercourse.  Having sexual intercourse with an infected partner. SIGNS AND SYMPTOMS  Symptoms of trichomoniasis in women include:  Abnormal gray-green frothy vaginal discharge.  Itching and irritation of the vagina.  Itching and irritation of the area outside the vagina. Symptoms of trichomoniasis in men include:   Penile discharge with or without pain.  Pain during urination. This results from inflammation of the urethra. DIAGNOSIS  Trichomoniasis may be found during a Pap test or physical exam. Your health care provider may use one of the following methods to help diagnose this infection:  Testing the pH of the vagina with a test tape.  Using a vaginal swab test that checks for the Trichomonas organism. A test is available that provides results within a few minutes.  Examining a urine sample.  Testing vaginal secretions. Your health care provider may test you for other STIs, including HIV. TREATMENT   You may be given medicine to fight the infection. Women should inform their health care provider if they could be or are pregnant. Some medicines used to treat the infection should not be taken during pregnancy.  Your health care provider may recommend over-the-counter medicines or creams to decrease itching or irritation.  Your sexual partner will need to be treated if infected.  Your health care provider may test you for infection again 3 months after treatment. HOME CARE INSTRUCTIONS   Take medicines only as  directed by your health care provider.  Take over-the-counter medicine for itching or irritation as directed by your health care provider.  Do not have sexual intercourse while you have the infection.  Women should not douche or wear tampons while they have the infection.  Discuss your infection with your partner. Your partner may have gotten the infection from you, or you may have gotten it from your partner.  Have your sex partner get examined and treated if necessary.  Practice safe, informed, and protected sex.  See your health care provider for other STI testing. SEEK MEDICAL CARE IF:   You still have symptoms after you finish your medicine.  You develop abdominal pain.  You have pain when you urinate.  You have bleeding after sexual intercourse.  You develop a rash.  Your medicine makes you sick or makes you throw up (vomit). MAKE SURE YOU:  Understand these instructions.  Will watch your condition.  Will get help right away if you are not doing well or get worse.   This information is not intended to replace advice given to you by your health care provider. Make sure you discuss any questions you have with your health care provider.   Document Released: 04/29/2001 Document Revised: 11/24/2014 Document Reviewed: 08/15/2013 Elsevier Interactive Patient Education 2016 Elsevier [employer] Urinary Tract Infection Urinary tract infections (UTIs) can develop anywhere along your urinary tract. Your urinary tract is your body's drainage system for removing wastes and extra water. Your urinary tract includes two kidneys, two ureters, a bladder, and a urethra. Your kidneys are a pair of bean-shaped organs. Each kidney is about the size of your fist. They are  located below your ribs, one on each side of your spine. CAUSES Infections are caused by microbes, which are microscopic organisms, including fungi, viruses, and bacteria. These organisms are so small that they can only be seen  through a microscope. Bacteria are the microbes that most commonly cause UTIs. SYMPTOMS  Symptoms of UTIs may vary by age and gender of the patient and by the location of the infection. Symptoms in young women typically include a frequent and intense urge to urinate and a painful, burning feeling in the bladder or urethra during urination. Older women and men are more likely to be tired, shaky, and weak and have muscle aches and abdominal pain. A fever may mean the infection is in your kidneys. Other symptoms of a kidney infection include pain in your back or sides below the ribs, nausea, and vomiting. DIAGNOSIS To diagnose a UTI, your caregiver will ask you about your symptoms. Your caregiver will also ask you to provide a urine sample. The urine sample will be tested for bacteria and white blood cells. White blood cells are made by your body to help fight infection. TREATMENT  Typically, UTIs can be treated with medication. Because most UTIs are caused by a bacterial infection, they usually can be treated with the use of antibiotics. The choice of antibiotic and length of treatment depend on your symptoms and the type of bacteria causing your infection. HOME CARE INSTRUCTIONS  If you were prescribed antibiotics, take them exactly as your caregiver instructs you. Finish the medication even if you feel better after you have only taken some of the medication.  Drink enough water and fluids to keep your urine clear or pale yellow.  Avoid caffeine, tea, and carbonated beverages. They tend to irritate your bladder.  Empty your bladder often. Avoid holding urine for long periods of time.  Empty your bladder before and after sexual intercourse.  After a bowel movement, women should cleanse from front to back. Use each tissue only once. SEEK MEDICAL CARE IF:   You have back pain.  You develop a fever.  Your symptoms do not begin to resolve within 3 days. SEEK IMMEDIATE MEDICAL CARE IF:   You  have severe back pain or lower abdominal pain.  You develop chills.  You have nausea or vomiting.  You have continued burning or discomfort with urination. MAKE SURE YOU:   Understand these instructions.  Will watch your condition.  Will get help right away if you are not doing well or get worse.   This information is not intended to replace advice given to you by your health care provider. Make sure you discuss any questions you have with your health care provider.   Document Released: 08/13/2005 Document Revised: 07/25/2015 Document Reviewed: 12/12/2011 Elsevier Interactive Patient Education [employer].

## 2016-05-15 LAB — GC/CHLAMYDIA PROBE AMP (~~LOC~~) NOT AT ARMC
CHLAMYDIA, DNA PROBE: NEGATIVE
NEISSERIA GONORRHEA: NEGATIVE

## 2016-05-16 LAB — URINE CULTURE: Culture: NO GROWTH

## 2016-07-05 ENCOUNTER — Emergency Department (HOSPITAL_BASED_OUTPATIENT_CLINIC_OR_DEPARTMENT_OTHER)
Admission: EM | Admit: 2016-07-05 | Discharge: 2016-07-05 | Disposition: A | Discharge status: Home / Self Care | Payer: Self-pay | Attending: Emergency Medicine | Admitting: Emergency Medicine

## 2016-07-05 ENCOUNTER — Emergency Department (HOSPITAL_BASED_OUTPATIENT_CLINIC_OR_DEPARTMENT_OTHER): Payer: Self-pay

## 2016-07-05 ENCOUNTER — Encounter (HOSPITAL_BASED_OUTPATIENT_CLINIC_OR_DEPARTMENT_OTHER): Payer: Self-pay | Admitting: *Deleted

## 2016-07-05 DIAGNOSIS — K219 Gastro-esophageal reflux disease without esophagitis: Secondary | ICD-10-CM | POA: Insufficient documentation

## 2016-07-05 DIAGNOSIS — J449 Chronic obstructive pulmonary disease, unspecified: Secondary | ICD-10-CM | POA: Insufficient documentation

## 2016-07-05 DIAGNOSIS — J45909 Unspecified asthma, uncomplicated: Secondary | ICD-10-CM | POA: Insufficient documentation

## 2016-07-05 DIAGNOSIS — F17210 Nicotine dependence, cigarettes, uncomplicated: Secondary | ICD-10-CM | POA: Insufficient documentation

## 2016-07-05 DIAGNOSIS — N939 Abnormal uterine and vaginal bleeding, unspecified: Secondary | ICD-10-CM | POA: Insufficient documentation

## 2016-07-05 LAB — CBC
HEMATOCRIT: 38.9 % (ref 36.0–46.0)
HEMOGLOBIN: 13 g/dL (ref 12.0–15.0)
MCH: 31.5 pg (ref 26.0–34.0)
MCHC: 33.4 g/dL (ref 30.0–36.0)
MCV: 94.2 fL (ref 78.0–100.0)
Platelets: 343 10*3/uL (ref 150–400)
RBC: 4.13 MIL/uL (ref 3.87–5.11)
RDW: 13.6 % (ref 11.5–15.5)
WBC: 16 10*3/uL — AB (ref 4.0–10.5)

## 2016-07-05 LAB — COMPREHENSIVE METABOLIC PANEL
ALBUMIN: 4 g/dL (ref 3.5–5.0)
ALK PHOS: 66 U/L (ref 38–126)
ALT: 15 U/L (ref 14–54)
AST: 22 U/L (ref 15–41)
Anion gap: 8 (ref 5–15)
BUN: 10 mg/dL (ref 6–20)
CALCIUM: 10.1 mg/dL (ref 8.9–10.3)
CHLORIDE: 104 mmol/L (ref 101–111)
CO2: 26 mmol/L (ref 22–32)
CREATININE: 0.7 mg/dL (ref 0.44–1.00)
GFR calc Af Amer: >60 mL/min (ref >60)
GFR calc non Af Amer: >60 mL/min (ref >60)
GLUCOSE: 93 mg/dL (ref 65–99)
Potassium: 3.2 mmol/L — ABNORMAL LOW (ref 3.5–5.1)
SODIUM: 138 mmol/L (ref 135–145)
Total Bilirubin: 0.1 mg/dL — ABNORMAL LOW (ref 0.3–1.2)
Total Protein: 7.1 g/dL (ref 6.5–8.1)

## 2016-07-05 LAB — LIPASE, BLOOD: Lipase: 17 U/L (ref 11–51)

## 2016-07-05 LAB — TROPONIN I: Troponin I: <0.03 ng/mL (ref <0.03)

## 2016-07-05 MED ORDER — OMEPRAZOLE 20 MG PO CPDR: 20.0000 mg | DELAYED_RELEASE_CAPSULE | Freq: Every day | ORAL | 1 refills | Status: DC

## 2016-07-05 MED ORDER — GI COCKTAIL ~~LOC~~
30.0000 mL | Freq: Once | ORAL | Status: AC
Start: 2016-07-05 — End: 2016-07-05
  Administered 2016-07-05: 30 mL via ORAL
  Filled 2016-07-05: qty 30

## 2016-07-05 MED ORDER — METRONIDAZOLE 500 MG PO TABS: 500.0000 mg | ORAL_TABLET | Freq: Three times a day (TID) | ORAL | 0 refills | Status: AC

## 2016-07-05 NOTE — Discharge Instructions (Signed)
Take antacid medications as we discussed.  You were also given a refill of your flagyl.  Make an appointment to see a gastroenterologist or primary care doctor. Return as needed for worsening symptoms.

## 2016-07-05 NOTE — ED Notes (Signed)
Three unsuccessful attempts for IV access (blood has been drawn); per MD -- okay not to attempt IV insertion at this time.

## 2016-07-05 NOTE — ED Notes (Signed)
Patient transported to X-ray 

## 2016-07-05 NOTE — ED Triage Notes (Signed)
Pt reports cental chest pain for approx 2weeks that worsens with eating. States she feels as if every time she eats, the food gets stuck behind breastbone area. Denies fever, nausea (with exception of n/v 2 days ago), diarrhea. Reports sob while trying to eat and/or drink. Denies dizziness, light-headedness.

## 2016-07-05 NOTE — ED Provider Notes (Addendum)
[facility] DEPT [facility] Provider Note   CSN: [id number] Arrival date & time: 07/05/16  0714    History   Chief Complaint Chief Complaint  Patient presents with  . Chest Pain  . Vaginal Itching    HPI Christine Campbell is a 40 y.o. female.  HPI Sx started a few weeks ago.   Whenever she eats she will get a pain in her chest and feels like the food gets stuck in her throat.  It seems to get better after 30 minutes.  The pain goes to the back in the shoulder burn.  The pain is sharp and stabbing.  She has a burning in her stomach as well.  She has vomited.  Last time was day before yesterday..  No fever.  No diarrhea.  She does have a history of GERD.   She has tried tums without relief.     Past Medical History:  Diagnosis Date  . Asthma   . Brain injury ([place])   . Bronchitis, chronic ([place])   . Hypoglycemia   . Migraine   . Seizures [facility])     Patient Active Problem List   Diagnosis Date Noted  . COPD (chronic obstructive pulmonary disease) with chronic bronchitis ([place]) 05/18/2014  . Interstitial lung disease ([place]) 11/21/2013  . Seizures ([place]) 11/21/2013    Past Surgical History:  Procedure Laterality Date  . traumatic brain injury    . TUBAL LIGATION    . VIDEO BRONCHOSCOPY Bilateral 11/23/2013   Procedure: VIDEO BRONCHOSCOPY WITH FLUORO;  Surgeon: Rigoberto Noel, MD;  Location: [facility];  Service: Cardiopulmonary;  Laterality: Bilateral;    OB History    Gravida Para Term Preterm AB Living   6 3 3  0 3 3   SAB TAB Ectopic Multiple Live Births   1 1             Home Medications    Prior to Admission medications   Medication Sig Start Date End Date Taking? Authorizing Provider  albuterol (PROVENTIL HFA;VENTOLIN HFA) 108 (90 BASE) MCG/ACT inhaler Inhale 2 puffs into the lungs every 4 (four) hours as needed for wheezing or shortness of breath. 11/24/13   Shanker Kristeen Mans, MD  ciprofloxacin (CIPRO) 500 MG tablet Take 1 tablet (500 mg total) by mouth 2 (two) times  daily. One po bid x 7 days 05/14/16   Charlesetta Shanks, MD  gabapentin (NEURONTIN) 300 MG capsule Take 300 mg by mouth 3 (three) times daily.    Historical Provider, MD  metroNIDAZOLE (FLAGYL) 500 MG tablet Take 1 tablet (500 mg total) by mouth 3 (three) times daily. Take one tablet three times daily for 7 days 07/05/16 07/26/16  Dorie Rank, MD  omeprazole (PRILOSEC) 20 MG capsule Take 1 capsule (20 mg total) by mouth daily. 07/05/16   Dorie Rank, MD  Tiotropium Bromide Monohydrate (SPIRIVA RESPIMAT) 2.5 MCG/ACT AERS Inhale 2 puffs into the lungs daily. Patient taking differently: Inhale 2 puffs into the lungs daily as needed (for shortness of breath).  08/30/14   Rigoberto Noel, MD    Family History Family History  Problem Relation Age of Onset  . Hypertension Mother   . Diabetes Mother   . Heart disease Maternal Uncle     Social History Social History  Substance Use Topics  . Smoking status: Light Tobacco Smoker    Packs/day: 0.50    Years: 24.00    Types: Cigarettes  . Smokeless tobacco: Never Used  . Alcohol use No  Allergies   Cephalosporins; Penicillins; Erythromycin; Tramadol; and Vicodin [hydrocodone-acetaminophen]   Review of Systems Review of Systems  Genitourinary: Positive for vaginal bleeding.       Currently on her menses, also with vaginal itching, concerned she might have an infection, hx of having this before  All other systems reviewed and are negative.    Physical Exam Updated Vital Signs BP 108/60 (BP Location: Right Arm)   Pulse 64   Temp 97.9 F (36.6 C)   Resp 12   Ht 5\' 1"  (1.549 m)   Wt 63.5 kg   LMP 06/17/2016 (Approximate)   SpO2 99%   BMI 26.45 kg/m   Physical Exam  Constitutional: She appears well-developed and well-nourished. No distress.  HENT:  Head: Normocephalic and atraumatic.  Right Ear: External ear normal.  Left Ear: External ear normal.  Eyes: Conjunctivae are normal. Right eye exhibits no discharge. Left eye exhibits no  discharge. No scleral icterus.  Neck: Neck supple. No tracheal deviation present.  Cardiovascular: Normal rate.   Pulmonary/Chest: Effort normal. No stridor. No respiratory distress.  Abdominal: Soft. Normal appearance. She exhibits no distension and no mass. There is no hepatosplenomegaly. There is tenderness in the epigastric area. There is no rigidity and no guarding. No hernia.  Musculoskeletal: She exhibits no edema.  Neurological: She is alert. Cranial nerve deficit: no gross deficits.  Skin: Skin is warm and dry. No rash noted.  Psychiatric: She has a normal mood and affect.  Nursing note and vitals reviewed.    ED Treatments / Results  Labs (all labs ordered are listed, but only abnormal results are displayed) Labs Reviewed  CBC - Abnormal; Notable for the following:       Result Value   WBC 16.0 (*)    All other components within normal limits  COMPREHENSIVE METABOLIC PANEL - Abnormal; Notable for the following:    Potassium 3.2 (*)    Total Bilirubin 0.1 (*)    All other components within normal limits  TROPONIN I  LIPASE, BLOOD    EKG  EKG Interpretation  Date/Time:  Saturday July 05 2016 07:46:21 EDT Ventricular Rate:  72 PR Interval:    QRS Duration: 87 QT Interval:  368 QTC Calculation: 403 R Axis:   44 Text Interpretation:  Sinus rhythm No significant change since last tracing Confirmed by Korry Dalgleish  MD-J, Raylynn Hersh ([id number]) on 07/05/2016 7:49:34 AM       Radiology Dg Chest 2 View  Result Date: 07/05/2016 CLINICAL DATA:  Chest pain and shortness of breath EXAM: CHEST  2 VIEW COMPARISON:  Mar 20, 2015 FINDINGS: The lungs are clear. The heart size and pulmonary vascularity are normal. No adenopathy. No pneumothorax. No bone lesions. IMPRESSION: No edema or consolidation. Electronically Signed   By: Lowella Grip III M.D.   On: 07/05/2016 08:23    Procedures Procedures (including critical care time)  Medications Ordered in ED Medications  gi cocktail  (Maalox,Lidocaine,Donnatal) (30 mLs Oral Given 07/05/16 0803)     Initial Impression / Assessment and Plan / ED Course  I have reviewed the triage vital signs and the nursing notes.  Pertinent labs & imaging results that were available during my care of the patient were reviewed by me and considered in my medical decision making (see chart for details).  Clinical Course  Comment By Time  I discussed evaluating her chest pain as well as her vaginal itching. Patient does not want to help the pelvic exam. She would just like a  prescription for Flagyl. I explained to the patient would be inappropriate for me to just give her the prescription. I would be happy to an exam and treat her accordingly. Patient does not want to have that done right now because of her menses. Dorie Rank, MD 08/19 [phone]  Abdomen was reexamined.  No epigastric or right upper quadrant tenderness Dorie Rank, MD 08/19 0915  CXR normal.  Labs reviewed   Dorie Rank, MD 08/19 0932  CBC elevated but appears to be chronically elevated.  Doubt serious infection Dorie Rank, MD 08/19 [phone]    I suspect her symptoms are related to esophagitis. I will start her on Prilosec. I recommend she follow up with a primary care doctor and I also gave her the name of the gastroenterology group.  I doubt that her symptoms are related to pneumonia, acute coronary syndrome, ulnar embolism, dissection, cholecystitis or other emergent condition.  Patient also requested a refill for Flagyl. Patient states the last time she was given the prescription she did not finish the course because she lost the pill bottle. I will give Her refill of Flagyl. Final Clinical Impressions(s) / ED Diagnoses   Final diagnoses:  Gastroesophageal reflux disease, esophagitis presence not specified    New Prescriptions New Prescriptions   OMEPRAZOLE (PRILOSEC) 20 MG CAPSULE    Take 1 capsule (20 mg total) by mouth daily.     Dorie Rank, MD 07/05/16 [phone]  Late physical  exam entry   Dorie Rank, MD 08/06/16 1406

## 2016-11-26 ENCOUNTER — Other Ambulatory Visit: Payer: Self-pay | Admitting: Family Medicine

## 2016-11-26 ENCOUNTER — Encounter: Payer: Self-pay | Admitting: Family Medicine

## 2016-11-26 ENCOUNTER — Ambulatory Visit: Payer: BLUE CROSS/BLUE SHIELD | Admitting: Family Medicine

## 2016-11-26 ENCOUNTER — Ambulatory Visit: Payer: Self-pay | Attending: Family Medicine | Admitting: Family Medicine

## 2016-11-26 VITALS — BP 118/82 | HR 81 | Temp 98.4°F | Ht 61.0 in | Wt 156.6 lb

## 2016-11-26 DIAGNOSIS — Z885 Allergy status to narcotic agent status: Secondary | ICD-10-CM | POA: Insufficient documentation

## 2016-11-26 DIAGNOSIS — Z888 Allergy status to other drugs, medicaments and biological substances status: Secondary | ICD-10-CM | POA: Insufficient documentation

## 2016-11-26 DIAGNOSIS — M5441 Lumbago with sciatica, right side: Secondary | ICD-10-CM

## 2016-11-26 DIAGNOSIS — M5442 Lumbago with sciatica, left side: Secondary | ICD-10-CM

## 2016-11-26 DIAGNOSIS — Z87820 Personal history of traumatic brain injury: Secondary | ICD-10-CM | POA: Insufficient documentation

## 2016-11-26 DIAGNOSIS — G8929 Other chronic pain: Secondary | ICD-10-CM

## 2016-11-26 DIAGNOSIS — Z9889 Other specified postprocedural states: Secondary | ICD-10-CM | POA: Insufficient documentation

## 2016-11-26 DIAGNOSIS — Z13228 Encounter for screening for other metabolic disorders: Secondary | ICD-10-CM | POA: Insufficient documentation

## 2016-11-26 DIAGNOSIS — Z880 Allergy status to penicillin: Secondary | ICD-10-CM | POA: Insufficient documentation

## 2016-11-26 DIAGNOSIS — R569 Unspecified convulsions: Secondary | ICD-10-CM | POA: Insufficient documentation

## 2016-11-26 DIAGNOSIS — Z79899 Other long term (current) drug therapy: Secondary | ICD-10-CM | POA: Insufficient documentation

## 2016-11-26 DIAGNOSIS — R200 Anesthesia of skin: Secondary | ICD-10-CM | POA: Insufficient documentation

## 2016-11-26 DIAGNOSIS — F329 Major depressive disorder, single episode, unspecified: Secondary | ICD-10-CM | POA: Insufficient documentation

## 2016-11-26 DIAGNOSIS — M545 Low back pain: Secondary | ICD-10-CM | POA: Insufficient documentation

## 2016-11-26 DIAGNOSIS — J45909 Unspecified asthma, uncomplicated: Secondary | ICD-10-CM | POA: Insufficient documentation

## 2016-11-26 MED ORDER — LEVETIRACETAM 500 MG PO TABS: 500.0000 mg | ORAL_TABLET | Freq: Two times a day (BID) | ORAL | 3 refills | Status: DC

## 2016-11-26 MED ORDER — MELOXICAM 7.5 MG PO TABS: 7.5000 mg | ORAL_TABLET | Freq: Every day | ORAL | 1 refills | Status: DC

## 2016-11-26 MED ORDER — METHOCARBAMOL 750 MG PO TABS: 750.0000 mg | ORAL_TABLET | Freq: Three times a day (TID) | ORAL | 1 refills | Status: DC | PRN

## 2016-11-26 MED FILL — levETIRAcetam 500 MG TABS: 500 | 30 days supply | Qty: 60 | Fill #0

## 2016-11-26 MED FILL — METHOCARBAMOL 750 MG TABLET: 750 | 30 days supply | Qty: 90 | Fill #0

## 2016-11-26 MED FILL — MELOXICAM 7.5 MG TABLET: 7.5 | 60 days supply | Qty: 60 | Fill #0

## 2016-11-26 NOTE — Patient Instructions (Signed)

## 2016-11-26 NOTE — Progress Notes (Signed)
Supposed to be on seizure medication but cannot afford it Is not on any meds at this time
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
## 2016-11-27 ENCOUNTER — Telehealth: Payer: Self-pay | Admitting: Family Medicine

## 2016-11-27 NOTE — Telephone Encounter (Signed)
Pt. Called stating that she was seen yesterday and the medications that were prescribed by her PCP are making her sick. She is having nausea and vomiting. Please f/u  methocarbamol (ROBAXIN-750) 750 MG tablet  meloxicam (MOBIC) 7.5 MG tablet

## 2016-11-28 ENCOUNTER — Other Ambulatory Visit: Payer: Self-pay

## 2016-11-28 LAB — DRUG ABUSE PANEL 10-50, U
AMPHETAMINES (1000 NG/ML SCRN): NEGATIVE
BARBITURATES: NEGATIVE
BENZODIAZEPINES: NEGATIVE
COCAINE METABOLITES: NEGATIVE
MARIJUANA MET (50 NG/ML SCRN): NEGATIVE
METHADONE: NEGATIVE
METHAQUALONE: NEGATIVE
OPIATES: NEGATIVE
PHENCYCLIDINE: NEGATIVE
PROPOXYPHENE: NEGATIVE

## 2016-11-28 MED ORDER — TIZANIDINE HCL 4 MG PO TABS: 4.0000 mg | ORAL_TABLET | Freq: Four times a day (QID) | ORAL | 0 refills | Status: DC | PRN

## 2016-11-28 NOTE — Telephone Encounter (Signed)
I have sent a different prescription to her pharmacy.

## 2016-12-01 NOTE — Telephone Encounter (Signed)
Writer called patient to discuss new medication MD sent to the pharmacy.  Patient stated understanding and will pick up the new medication tomorrow.

## 2016-12-05 ENCOUNTER — Other Ambulatory Visit: Payer: Self-pay | Admitting: Family Medicine

## 2016-12-05 ENCOUNTER — Ambulatory Visit: Payer: Self-pay | Attending: Family Medicine

## 2016-12-05 DIAGNOSIS — Z13228 Encounter for screening for other metabolic disorders: Secondary | ICD-10-CM | POA: Insufficient documentation

## 2016-12-05 LAB — LIPID PANEL W/REFLEX DIRECT LDL
CHOL/HDL RATIO: 6.6 ratio — AB (ref <5.0)
Cholesterol: 264 mg/dL — ABNORMAL HIGH (ref <200)
HDL: 40 mg/dL — AB (ref >50)
LDL-Cholesterol: 186 mg/dL — ABNORMAL HIGH
NON-HDL CHOLESTEROL (CALC): 224 mg/dL — AB (ref <130)
Triglycerides: 198 mg/dL — ABNORMAL HIGH (ref <150)

## 2016-12-05 LAB — COMPLETE METABOLIC PANEL WITH GFR
ALBUMIN: 4.4 g/dL (ref 3.6–5.1)
ALK PHOS: 82 U/L (ref 33–115)
ALT: 18 U/L (ref 6–29)
AST: 19 U/L (ref 10–30)
BUN: 11 mg/dL (ref 7–25)
CHLORIDE: 107 mmol/L (ref 98–110)
CO2: 22 mmol/L (ref 20–31)
Calcium: 9.6 mg/dL (ref 8.6–10.2)
Creat: 0.82 mg/dL (ref 0.50–1.10)
GFR, Est African American: >89 mL/min (ref >=60)
GLUCOSE: 105 mg/dL — AB (ref 65–99)
POTASSIUM: 4.1 mmol/L (ref 3.5–5.3)
Sodium: 138 mmol/L (ref 135–146)
Total Bilirubin: 0.3 mg/dL (ref 0.2–1.2)
Total Protein: 7.4 g/dL (ref 6.1–8.1)

## 2016-12-05 MED ORDER — ATORVASTATIN CALCIUM 20 MG PO TABS: 20.0000 mg | ORAL_TABLET | Freq: Every day | ORAL | 3 refills | Status: DC

## 2016-12-05 NOTE — Progress Notes (Signed)
Patient denies any alcohol and latex allergies.

## 2016-12-05 NOTE — Patient Instructions (Signed)
Patient is aware of receiving a FU call regarding results. 

## 2016-12-19 MED FILL — ATORVASTATIN 20 MG TABLET: 20 | 30 days supply | Qty: 30 | Fill #0

## 2016-12-19 MED FILL — tiZANidine HCL 4 MG TABS: 4 | 23 days supply | Qty: 90 | Fill #0

## 2016-12-24 ENCOUNTER — Telehealth: Payer: Self-pay | Admitting: Family Medicine

## 2016-12-24 ENCOUNTER — Ambulatory Visit: Payer: Self-pay

## 2016-12-24 ENCOUNTER — Encounter: Payer: Self-pay | Admitting: Family Medicine

## 2016-12-24 ENCOUNTER — Ambulatory Visit: Payer: Self-pay | Attending: Family Medicine | Admitting: Family Medicine

## 2016-12-24 VITALS — BP 107/77 | HR 95 | Temp 98.2°F | Ht 61.0 in | Wt 155.4 lb

## 2016-12-24 DIAGNOSIS — E785 Hyperlipidemia, unspecified: Secondary | ICD-10-CM | POA: Insufficient documentation

## 2016-12-24 DIAGNOSIS — M542 Cervicalgia: Secondary | ICD-10-CM | POA: Insufficient documentation

## 2016-12-24 DIAGNOSIS — J449 Chronic obstructive pulmonary disease, unspecified: Secondary | ICD-10-CM | POA: Insufficient documentation

## 2016-12-24 DIAGNOSIS — E7800 Pure hypercholesterolemia, unspecified: Secondary | ICD-10-CM

## 2016-12-24 DIAGNOSIS — Z87820 Personal history of traumatic brain injury: Secondary | ICD-10-CM | POA: Insufficient documentation

## 2016-12-24 DIAGNOSIS — Z23 Encounter for immunization: Secondary | ICD-10-CM

## 2016-12-24 DIAGNOSIS — Z79899 Other long term (current) drug therapy: Secondary | ICD-10-CM | POA: Insufficient documentation

## 2016-12-24 DIAGNOSIS — M5441 Lumbago with sciatica, right side: Secondary | ICD-10-CM | POA: Insufficient documentation

## 2016-12-24 DIAGNOSIS — M5442 Lumbago with sciatica, left side: Secondary | ICD-10-CM | POA: Insufficient documentation

## 2016-12-24 DIAGNOSIS — R112 Nausea with vomiting, unspecified: Secondary | ICD-10-CM | POA: Insufficient documentation

## 2016-12-24 DIAGNOSIS — M549 Dorsalgia, unspecified: Secondary | ICD-10-CM | POA: Insufficient documentation

## 2016-12-24 DIAGNOSIS — K219 Gastro-esophageal reflux disease without esophagitis: Secondary | ICD-10-CM | POA: Insufficient documentation

## 2016-12-24 DIAGNOSIS — G8929 Other chronic pain: Secondary | ICD-10-CM | POA: Insufficient documentation

## 2016-12-24 DIAGNOSIS — Z9889 Other specified postprocedural states: Secondary | ICD-10-CM | POA: Insufficient documentation

## 2016-12-24 DIAGNOSIS — R569 Unspecified convulsions: Secondary | ICD-10-CM | POA: Insufficient documentation

## 2016-12-24 DIAGNOSIS — R200 Anesthesia of skin: Secondary | ICD-10-CM | POA: Insufficient documentation

## 2016-12-24 MED ORDER — ALBUTEROL SULFATE HFA 108 (90 BASE) MCG/ACT IN AERS: 2.0000 | INHALATION_SPRAY | RESPIRATORY_TRACT | 2 refills | Status: DC | PRN

## 2016-12-24 MED ORDER — TIOTROPIUM BROMIDE MONOHYDRATE 2.5 MCG/ACT IN AERS: 2.0000 | INHALATION_SPRAY | Freq: Every day | RESPIRATORY_TRACT | 2 refills | Status: DC

## 2016-12-24 MED ORDER — ACETAMINOPHEN-CODEINE #3 300-30 MG PO TABS: 1.0000 | ORAL_TABLET | Freq: Two times a day (BID) | ORAL | 0 refills | Status: DC | PRN

## 2016-12-24 MED ORDER — OMEPRAZOLE 20 MG PO CPDR: 20.0000 mg | DELAYED_RELEASE_CAPSULE | Freq: Every day | ORAL | 2 refills | Status: DC

## 2016-12-24 MED ORDER — GABAPENTIN 300 MG PO CAPS: 300.0000 mg | ORAL_CAPSULE | Freq: Three times a day (TID) | ORAL | 3 refills | Status: DC

## 2016-12-24 MED FILL — ACETAMINOPHEN/COD #3 TABLET: 300-30 | 30 days supply | Qty: 60 | Fill #0

## 2016-12-24 MED FILL — GABAPENTIN 300 MG CAPSULE: 300 | 30 days supply | Qty: 90 | Fill #0

## 2016-12-24 NOTE — Telephone Encounter (Signed)
Patient called the office asking to speak with PCP. Pt forgot to mention to provider that she wants to get back on her migraine medication (tropomax) and if she can call it in. Please follow up.   Thank you.
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
## 2016-12-25 MED ORDER — TOPIRAMATE 50 MG PO TABS: 50.0000 mg | ORAL_TABLET | Freq: Two times a day (BID) | ORAL | 3 refills | Status: DC

## 2016-12-25 NOTE — Telephone Encounter (Signed)
Done

## 2016-12-26 NOTE — Telephone Encounter (Signed)
Patient aware that MD has sent over a prescription for topamax.

## 2017-01-12 ENCOUNTER — Telehealth: Payer: Self-pay | Admitting: Family Medicine

## 2017-01-12 NOTE — Telephone Encounter (Signed)
Pt. Called requesting an appt. Pt. Was told that her PCP had no available appt. Pt. States she has a really bad cough and trouble breathing. Please f/u with pt.

## 2017-01-12 NOTE — Telephone Encounter (Addendum)
Pt states she has a real bad cough and chest cold. No other sx's associated with this: no fever or night sweats Cough for 3 days, productive.  Has used albuterol inhaler without relief.  SOB with doing normal activities. Please advise  PLEASE CALL BACK ON THIS NUMBER: [phone]

## 2017-01-13 ENCOUNTER — Other Ambulatory Visit: Payer: Self-pay | Admitting: Family Medicine

## 2017-01-13 MED ORDER — UMECLIDINIUM BROMIDE 62.5 MCG/INH IN AEPB: 1.0000 | INHALATION_SPRAY | Freq: Every day | RESPIRATORY_TRACT | 1 refills | Status: DC

## 2017-01-13 MED FILL — ?TOPIRAMATE 50 MG TAB: 50 MG | 30 days supply | Qty: 60 | Fill #0

## 2017-01-13 MED FILL — VENTOLIN HFA 90 MCG INHALER: 108 (90 BAS | 16 days supply | Qty: 18 | Fill #0

## 2017-01-13 MED FILL — ?INCRUSE ELLIPTA 62.5 MCG I: 62.5 MCG | 28 days supply | Qty: 28 | Fill #0

## 2017-01-13 MED FILL — levETIRAcetam 500 MG TABS: 500 | 30 days supply | Qty: 60 | Fill #1

## 2017-01-13 MED FILL — ?OMEPRAZOLE DR 20 MG CAPSUL: 20 | 30 days supply | Qty: 30 | Fill #0

## 2017-01-13 MED FILL — GABAPENTIN 300 MG CAPSULE: 300 | 30 days supply | Qty: 90 | Fill #1

## 2017-01-13 MED FILL — ATORVASTATIN 20 MG TABLET: 20 | 30 days supply | Qty: 30 | Fill #1

## 2017-01-13 NOTE — Telephone Encounter (Signed)
Attempt to call patient back to schedule an apt. Today with provider. Unable  To reach. Gentleman answered the phone, stating he will be able to give message after he leaves dialysis at 11:30.

## 2017-01-14 ENCOUNTER — Encounter: Payer: Self-pay | Admitting: Family Medicine

## 2017-01-14 ENCOUNTER — Ambulatory Visit: Payer: Self-pay | Attending: Family Medicine | Admitting: Family Medicine

## 2017-01-14 ENCOUNTER — Ambulatory Visit: Payer: Self-pay | Admitting: Family Medicine

## 2017-01-14 VITALS — BP 136/81 | HR 84 | Temp 97.9°F | Ht 61.0 in | Wt 155.8 lb

## 2017-01-14 DIAGNOSIS — F410 Panic disorder [episodic paroxysmal anxiety] without agoraphobia: Secondary | ICD-10-CM | POA: Insufficient documentation

## 2017-01-14 DIAGNOSIS — Z880 Allergy status to penicillin: Secondary | ICD-10-CM | POA: Insufficient documentation

## 2017-01-14 DIAGNOSIS — F418 Other specified anxiety disorders: Secondary | ICD-10-CM

## 2017-01-14 DIAGNOSIS — Z885 Allergy status to narcotic agent status: Secondary | ICD-10-CM | POA: Insufficient documentation

## 2017-01-14 DIAGNOSIS — J4489 Other specified chronic obstructive pulmonary disease: Secondary | ICD-10-CM

## 2017-01-14 DIAGNOSIS — F419 Anxiety disorder, unspecified: Secondary | ICD-10-CM

## 2017-01-14 DIAGNOSIS — Z87820 Personal history of traumatic brain injury: Secondary | ICD-10-CM | POA: Insufficient documentation

## 2017-01-14 DIAGNOSIS — R569 Unspecified convulsions: Secondary | ICD-10-CM | POA: Insufficient documentation

## 2017-01-14 DIAGNOSIS — J449 Chronic obstructive pulmonary disease, unspecified: Secondary | ICD-10-CM | POA: Insufficient documentation

## 2017-01-14 DIAGNOSIS — F32A Depression, unspecified: Secondary | ICD-10-CM

## 2017-01-14 DIAGNOSIS — B001 Herpesviral vesicular dermatitis: Secondary | ICD-10-CM

## 2017-01-14 DIAGNOSIS — E785 Hyperlipidemia, unspecified: Secondary | ICD-10-CM | POA: Insufficient documentation

## 2017-01-14 DIAGNOSIS — G8929 Other chronic pain: Secondary | ICD-10-CM

## 2017-01-14 DIAGNOSIS — F329 Major depressive disorder, single episode, unspecified: Secondary | ICD-10-CM | POA: Insufficient documentation

## 2017-01-14 DIAGNOSIS — Z9889 Other specified postprocedural states: Secondary | ICD-10-CM | POA: Insufficient documentation

## 2017-01-14 MED ORDER — VALACYCLOVIR HCL 1 G PO TABS: 1000.0000 mg | ORAL_TABLET | Freq: Two times a day (BID) | ORAL | 0 refills | Status: DC

## 2017-01-14 MED ORDER — PREDNISONE 20 MG PO TABS: 20.0000 mg | ORAL_TABLET | Freq: Every day | ORAL | 0 refills | Status: DC

## 2017-01-14 MED ORDER — HYDROXYZINE HCL 25 MG PO TABS: 25.0000 mg | ORAL_TABLET | Freq: Three times a day (TID) | ORAL | 1 refills | Status: DC | PRN

## 2017-01-14 MED FILL — hydrOXYzine HCL 25 MG TABS: 25 | 30 days supply | Qty: 90 | Fill #0

## 2017-01-14 MED FILL — ?VALACYCLOVIR HCL 1 GRAM TA: 1 | 7 days supply | Qty: 14 | Fill #0

## 2017-01-14 MED FILL — ?PREDNISONE 20 MG TABLET: 20 | 5 days supply | Qty: 5 | Fill #0
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
## 2017-01-15 ENCOUNTER — Telehealth: Payer: Self-pay

## 2017-01-15 NOTE — Telephone Encounter (Signed)
Patient called today in tears requesting that MD prescribe Prozac for her depression and she is also requesting clonidine 0.1mg  which patient has taken prn for anxiety and panic attacks.  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 Patient states she know that she turned down the medications when she saw MD yesterday however she is feeling very anxious and depressed today and after thinking about it has decided to try the med's.

## 2017-01-16 MED ORDER — CLONIDINE HCL 0.1 MG PO TABS: 0.1000 mg | ORAL_TABLET | Freq: Two times a day (BID) | ORAL | 1 refills | Status: DC | PRN

## 2017-01-16 MED ORDER — FLUOXETINE HCL 20 MG PO TABS: 20.0000 mg | ORAL_TABLET | Freq: Every day | ORAL | 3 refills | Status: DC

## 2017-01-16 MED FILL — cloNIDine HCL 0.1 MG TABS: 0.1 | 30 days supply | Qty: 60 | Fill #0

## 2017-01-16 MED FILL — ?FLUOXETINE HCL 20MG TABLET: 20 | 30 days supply | Qty: 30 | Fill #0

## 2017-01-16 NOTE — Telephone Encounter (Signed)
Done

## 2017-01-16 NOTE — Addendum Note (Signed)
Addended by: Arnoldo Morale on: 01/16/2017 08:13 AM   Modules accepted: Orders

## 2017-01-19 ENCOUNTER — Telehealth: Payer: Self-pay | Admitting: Family Medicine

## 2017-01-19 ENCOUNTER — Other Ambulatory Visit: Payer: Self-pay | Admitting: Family Medicine

## 2017-01-19 NOTE — Telephone Encounter (Signed)
Patient is requesting a prescription for anxiety and instead was prescribed bp meds.  Patient is also requesting an antibiotic for her soar throat.  Please follow up

## 2017-01-19 NOTE — Telephone Encounter (Signed)
Addressed in another message.

## 2017-01-19 NOTE — Telephone Encounter (Signed)
error 

## 2017-01-19 NOTE — Telephone Encounter (Signed)
We had discussed initiation of antibiotic at her last visit with me however her chart flags allergies to multiple antibiotics with development of hives (which she was made aware of), she received prednisone instead; I am unable to prescribe Z-Pak for her due to her allergies. She received clonidine because the message indicated she needed clonidine for anxiety. I had placed her on hydroxyzine and if anxiety symptoms are still persistent and she wants Klonopin, I would suggest she make an appointment with mental health at [facility] to receive Klonopin.

## 2017-01-19 NOTE — Telephone Encounter (Signed)
Writer spoke with patient this morning.  She is requesting z-pak for worsening sore throat and for panic attacks she did not want the clonidine but the klonopin 2 mg.  The clonidine makes her extremely sleepy and she states that she has low BP.  She states it was the klonopin that helps with her anxiety attacks and doesn't make her drowsy or sleepy. She is requesting that it is sent to our pharmacy as soon as possible.

## 2017-01-19 NOTE — Telephone Encounter (Signed)
Pt requesting call back from nurse concerning what was discussed earlier today

## 2017-01-19 NOTE — Telephone Encounter (Signed)
Patient was informed that medication request was sent to PCP and she would be notified once there has been a response. Pt requesting for another call be from nurse when available

## 2017-01-19 NOTE — Telephone Encounter (Signed)
Patient is very hopeful that she will be able to pick up antibiotic for sore throat today.

## 2017-01-20 ENCOUNTER — Telehealth: Payer: Self-pay | Admitting: Family Medicine

## 2017-01-20 ENCOUNTER — Emergency Department (HOSPITAL_BASED_OUTPATIENT_CLINIC_OR_DEPARTMENT_OTHER)
Admission: EM | Admit: 2017-01-20 | Discharge: 2017-01-20 | Disposition: A | Discharge status: Home / Self Care | Payer: Self-pay | Attending: Emergency Medicine | Admitting: Emergency Medicine

## 2017-01-20 ENCOUNTER — Encounter (HOSPITAL_BASED_OUTPATIENT_CLINIC_OR_DEPARTMENT_OTHER): Payer: Self-pay | Admitting: Emergency Medicine

## 2017-01-20 ENCOUNTER — Other Ambulatory Visit: Payer: Self-pay | Admitting: *Deleted

## 2017-01-20 DIAGNOSIS — J029 Acute pharyngitis, unspecified: Secondary | ICD-10-CM | POA: Insufficient documentation

## 2017-01-20 DIAGNOSIS — F17210 Nicotine dependence, cigarettes, uncomplicated: Secondary | ICD-10-CM | POA: Insufficient documentation

## 2017-01-20 DIAGNOSIS — J45909 Unspecified asthma, uncomplicated: Secondary | ICD-10-CM | POA: Insufficient documentation

## 2017-01-20 DIAGNOSIS — J449 Chronic obstructive pulmonary disease, unspecified: Secondary | ICD-10-CM

## 2017-01-20 DIAGNOSIS — J4489 Other specified chronic obstructive pulmonary disease: Secondary | ICD-10-CM

## 2017-01-20 LAB — RAPID STREP SCREEN (MED CTR MEBANE ONLY): STREPTOCOCCUS, GROUP A SCREEN (DIRECT): NEGATIVE

## 2017-01-20 MED ORDER — NAPROXEN 500 MG PO TABS: 500.0000 mg | ORAL_TABLET | Freq: Two times a day (BID) | ORAL | 0 refills | Status: DC

## 2017-01-20 MED ORDER — TIOTROPIUM BROMIDE MONOHYDRATE 2.5 MCG/ACT IN AERS: 2.0000 | INHALATION_SPRAY | Freq: Every day | RESPIRATORY_TRACT | 3 refills | Status: DC

## 2017-01-20 MED ORDER — ALBUTEROL SULFATE HFA 108 (90 BASE) MCG/ACT IN AERS: 2.0000 | INHALATION_SPRAY | RESPIRATORY_TRACT | 3 refills | Status: DC | PRN

## 2017-01-20 MED ORDER — DEXAMETHASONE 6 MG PO TABS
10.0000 mg | ORAL_TABLET | Freq: Once | ORAL | Status: AC
  Administered 2017-01-20: 14:00:00 10 mg via ORAL
  Filled 2017-01-20: qty 1

## 2017-01-20 MED ORDER — ALBUTEROL SULFATE HFA 108 (90 BASE) MCG/ACT IN AERS: 2.0000 | INHALATION_SPRAY | RESPIRATORY_TRACT | 2 refills | Status: DC | PRN

## 2017-01-20 NOTE — Telephone Encounter (Signed)
Writer spoke with patient and informed patient that she would have to go to [place] to be evaluated for the anxiety meds she is requesting- klonopin. Writer also informed her that MD will not be giving her the zpak due to her many antibiotic allergies.  Patient was not happy and stated she will be going to the ED in [facility].

## 2017-01-20 NOTE — Telephone Encounter (Signed)
PRINTED FOR PASS PROGRAM 

## 2017-01-20 NOTE — ED Provider Notes (Signed)
[place] [facility] DEPT Provider Note   CSN: [id number] Arrival date & time: 01/20/17  1255     History   Chief Complaint Chief Complaint  Patient presents with  . Sore Throat    HPI Christine Campbell is a 41 y.o. female.  HPI  Pt presenting with c/o sore throat.  Pt states that she has been having sore throat for the past 3 days.  She has had no fever.  She was recently treated for a COPD exacerbation.  She has been taking ibuprofen without much relief of her symptoms.  No difficulty breathingo r swallowing.  There are no other associated systemic symptoms, there are no other alleviating or modifying factors.   Past Medical History:  Diagnosis Date  . Asthma   . Brain injury ([place])   . Bronchitis, chronic ([place])   . Hypoglycemia   . Migraine   . Seizures [facility])     Patient Active Problem List   Diagnosis Date Noted  . Anxiety 01/14/2017  . Herpes labialis 01/14/2017  . Chronic pain 01/14/2017  . GERD (gastroesophageal reflux disease) 12/24/2016  . Back pain 12/24/2016  . Neck pain 12/24/2016  . Hyperlipidemia 12/24/2016  . COPD (chronic obstructive pulmonary disease) with chronic bronchitis ([place]) 05/18/2014  . Interstitial lung disease ([place]) 11/21/2013  . Seizures ([place]) 11/21/2013    Past Surgical History:  Procedure Laterality Date  . traumatic brain injury    . TUBAL LIGATION    . VIDEO BRONCHOSCOPY Bilateral 11/23/2013   Procedure: VIDEO BRONCHOSCOPY WITH FLUORO;  Surgeon: Rigoberto Noel, MD;  Location: [facility];  Service: Cardiopulmonary;  Laterality: Bilateral;    OB History    Gravida Para Term Preterm AB Living   6 3 3  0 3 3   SAB TAB Ectopic Multiple Live Births   1 1             Home Medications    Prior to Admission medications   Medication Sig Start Date End Date Taking? Authorizing Provider  acetaminophen-codeine (TYLENOL #3) 300-30 MG tablet Take 1 tablet by mouth every 12 (twelve) hours as needed for moderate pain. 12/24/16   Arnoldo Morale, MD    albuterol (PROVENTIL HFA;VENTOLIN HFA) 108 (90 Base) MCG/ACT inhaler Inhale 2 puffs into the lungs every 4 (four) hours as needed for wheezing or shortness of breath. 01/20/17   Arnoldo Morale, MD  atorvastatin (LIPITOR) 20 MG tablet Take 1 tablet (20 mg total) by mouth daily. Patient not taking: Reported on 12/24/2016 12/05/16   Arnoldo Morale, MD  cloNIDine (CATAPRES) 0.1 MG tablet Take 1 tablet (0.1 mg total) by mouth 2 (two) times daily as needed (for anxiety). 01/16/17   Arnoldo Morale, MD  FLUoxetine (PROZAC) 20 MG tablet Take 1 tablet (20 mg total) by mouth daily. 01/16/17   Arnoldo Morale, MD  gabapentin (NEURONTIN) 300 MG capsule Take 1 capsule (300 mg total) by mouth 3 (three) times daily. 12/24/16   Arnoldo Morale, MD  levETIRAcetam (KEPPRA) 500 MG tablet Take 1 tablet (500 mg total) by mouth 2 (two) times daily. 11/26/16   Arnoldo Morale, MD  naproxen (NAPROSYN) 500 MG tablet Take 1 tablet (500 mg total) by mouth 2 (two) times daily. 01/20/17   Alfonzo Beers, MD  omeprazole (PRILOSEC) 20 MG capsule Take 1 capsule (20 mg total) by mouth daily. 12/24/16   Arnoldo Morale, MD  predniSONE (DELTASONE) 20 MG tablet Take 1 tablet (20 mg total) by mouth daily with breakfast. 01/14/17   Arnoldo Morale, MD  Tiotropium Bromide Monohydrate (SPIRIVA RESPIMAT) 2.5 MCG/ACT AERS Inhale 2 puffs into the lungs daily. 01/20/17   Arnoldo Morale, MD  topiramate (TOPAMAX) 50 MG tablet Take 1 tablet (50 mg total) by mouth 2 (two) times daily. 12/25/16   Arnoldo Morale, MD  umeclidinium bromide (INCRUSE ELLIPTA) 62.5 MCG/INH AEPB Inhale 1 puff into the lungs daily. 01/13/17   Arnoldo Morale, MD  valACYclovir (VALTREX) 1000 MG tablet Take 1 tablet (1,000 mg total) by mouth 2 (two) times daily. 01/14/17   Arnoldo Morale, MD    Family History Family History  Problem Relation Age of Onset  . Hypertension Mother   . Diabetes Mother   . Heart disease Maternal Uncle     Social History Social History  Substance Use Topics  . Smoking status: Light Tobacco  Smoker    Packs/day: 0.25    Years: 24.00    Types: Cigarettes  . Smokeless tobacco: Never Used     Comment: 1-2 daily  . Alcohol use No     Allergies   Cephalosporins; Penicillins; Erythromycin; Tramadol; Vicodin [hydrocodone-acetaminophen]; Erythromycin base; and Hydrocodone-acetaminophen   Review of Systems Review of Systems  ROS reviewed and all otherwise negative except for mentioned in HPI   Physical Exam Updated Vital Signs BP 122/72 (BP Location: Right Arm)   Pulse 82   Temp 98 F (36.7 C) (Oral)   Resp 20   Ht 5\' 1"  (1.549 m)   Wt 65.8 kg   LMP  (LMP Unknown)   SpO2 100%   BMI 27.40 kg/m  Vitals reviewed Physical Exam Physical Examination: General appearance - alert, well appearing, and in no distress Mental status - alert, oriented to person, place, and time Eyes -no conjunctival injection no scleral icterus Mouth - OP with moderate erythema, palate symmetric, uvula midline, no exudate Neck - supple, no significant adenopathy Chest - clear to auscultation, no wheezes, rales or rhonchi, symmetric air entry Heart - normal rate, regular rhythm, normal S1, S2, no murmurs, rubs, clicks or gallops Abdomen - soft, nontender, nondistended, no masses or organomegaly Neurological - alert, oriented, normal speech Extremities - peripheral pulses normal, no pedal edema, no clubbing or cyanosis Skin - normal coloration and turgor, no rashes  ED Treatments / Results  Labs (all labs ordered are listed, but only abnormal results are displayed) Labs Reviewed  RAPID STREP SCREEN (NOT AT [facility])  CULTURE, GROUP A STREP [facility])    EKG  EKG Interpretation None       Radiology No results found.  Procedures Procedures (including critical care time)  Medications Ordered in ED Medications  dexamethasone (DECADRON) tablet 10 mg (10 mg Oral Given 01/20/17 1416)     Initial Impression / Assessment and Plan / ED Course  I have reviewed the triage vital signs and the  nursing notes.  Pertinent labs & imaging results that were available during my care of the patient were reviewed by me and considered in my medical decision making (see chart for details).     Pt presenting with sore throat, no fever.  Rapid strep is negative  Given decadron to help with symptoms.  Throat culture is pending.  Advised naproxen as well.  Discharged with strict return precautions.  Pt agreeable with plan.  Final Clinical Impressions(s) / ED Diagnoses   Final diagnoses:  Viral pharyngitis    New Prescriptions Discharge Medication List as of 01/20/2017  2:31 PM    START taking these medications   Details  naproxen (NAPROSYN) 500 MG tablet  Take 1 tablet (500 mg total) by mouth 2 (two) times daily., Starting Tue 01/20/2017, Print         Alfonzo Beers, MD 01/24/17 [id number]

## 2017-01-20 NOTE — Telephone Encounter (Signed)
Patient called the office to speak with nurse regarding an appt. Pt would not disclose any more information.

## 2017-01-20 NOTE — Discharge Instructions (Signed)
Return to the ED with any concerns including difficulty breathing or swallowing, vomiting and not able to keep down liquids, decreased level of alertness/lethargy, or any other alarming symptoms °

## 2017-01-20 NOTE — ED Triage Notes (Signed)
Pt c/o sore throat x 3-4 days

## 2017-01-22 LAB — CULTURE, GROUP A STREP (THRC)

## 2017-02-03 ENCOUNTER — Telehealth: Payer: Self-pay | Admitting: Family Medicine

## 2017-02-03 NOTE — Telephone Encounter (Signed)
Inquiring about specialist that Dr Jarold Song was supposed to have gotten her for her neck and back. States that her depression medication is not working, she feels more depressed now taking the meds. Please f/u with pt.

## 2017-02-04 ENCOUNTER — Encounter: Payer: Self-pay | Admitting: Physical Medicine & Rehabilitation

## 2017-02-06 ENCOUNTER — Other Ambulatory Visit: Payer: Self-pay | Admitting: Family Medicine

## 2017-02-06 ENCOUNTER — Telehealth: Payer: Self-pay | Admitting: Family Medicine

## 2017-02-06 DIAGNOSIS — B001 Herpesviral vesicular dermatitis: Secondary | ICD-10-CM

## 2017-02-06 MED FILL — GABAPENTIN 300 MG CAPSULE: 300 | 30 days supply | Qty: 90 | Fill #2

## 2017-02-06 MED FILL — ATORVASTATIN 20 MG TABLET: 20 | 30 days supply | Qty: 30 | Fill #2

## 2017-02-06 MED FILL — ?OMEPRAZOLE DR 20 MG CAPSUL: 20 | 30 days supply | Qty: 30 | Fill #1

## 2017-02-06 MED FILL — ?FLUOXETINE HCL 20MG TABLET: 20 | 30 days supply | Qty: 30 | Fill #1

## 2017-02-06 MED FILL — levETIRAcetam 500 MG TABS: 500 | 30 days supply | Qty: 60 | Fill #2

## 2017-02-06 MED FILL — TOPIRAMATE 50 MG TABLET: 50 | 30 days supply | Qty: 60 | Fill #1

## 2017-02-06 NOTE — Telephone Encounter (Signed)
She has requested for [place] as the only treatment for her anxiety in recent past and has declined other medications for anxiety . She was previously informed she would need to go to [facility] for management of mental health issues. She is welcome to come into the clinic to be assessed for increase in dose of current medications however she will not be receiving Klonopin.

## 2017-02-06 NOTE — Telephone Encounter (Signed)
Patient called the office to speak with PCP regarding her medication. Patient stated that both her anxiety and depression medication are not working. Pt is experiencing more depression now than before. Please follow up.  Thank you.

## 2017-02-06 NOTE — Telephone Encounter (Signed)
Notes in Epic indicate her chart is being reviewed by pain management. I had previously advised on [facility] for mental health care - please reiterate.

## 2017-02-06 NOTE — Telephone Encounter (Signed)
Late entry: attempt to contact patient. Left message on voicemail to return call [phone].

## 2017-02-10 ENCOUNTER — Emergency Department (HOSPITAL_BASED_OUTPATIENT_CLINIC_OR_DEPARTMENT_OTHER): Payer: MEDICAID

## 2017-02-10 ENCOUNTER — Inpatient Hospital Stay (HOSPITAL_BASED_OUTPATIENT_CLINIC_OR_DEPARTMENT_OTHER)
Admission: EM | Admit: 2017-02-10 | Discharge: 2017-02-15 | DRG: 871 | Disposition: A | Discharge status: Home / Self Care | Payer: MEDICAID | Attending: Internal Medicine | Admitting: Internal Medicine

## 2017-02-10 ENCOUNTER — Encounter (HOSPITAL_BASED_OUTPATIENT_CLINIC_OR_DEPARTMENT_OTHER): Payer: Self-pay | Admitting: Emergency Medicine

## 2017-02-10 DIAGNOSIS — Z833 Family history of diabetes mellitus: Secondary | ICD-10-CM

## 2017-02-10 DIAGNOSIS — Z888 Allergy status to other drugs, medicaments and biological substances status: Secondary | ICD-10-CM

## 2017-02-10 DIAGNOSIS — J440 Chronic obstructive pulmonary disease with acute lower respiratory infection: Secondary | ICD-10-CM | POA: Diagnosis present

## 2017-02-10 DIAGNOSIS — F419 Anxiety disorder, unspecified: Secondary | ICD-10-CM | POA: Diagnosis present

## 2017-02-10 DIAGNOSIS — J189 Pneumonia, unspecified organism: Secondary | ICD-10-CM | POA: Diagnosis present

## 2017-02-10 DIAGNOSIS — E876 Hypokalemia: Secondary | ICD-10-CM | POA: Diagnosis present

## 2017-02-10 DIAGNOSIS — F17210 Nicotine dependence, cigarettes, uncomplicated: Secondary | ICD-10-CM | POA: Diagnosis present

## 2017-02-10 DIAGNOSIS — J9690 Respiratory failure, unspecified, unspecified whether with hypoxia or hypercapnia: Secondary | ICD-10-CM | POA: Diagnosis present

## 2017-02-10 DIAGNOSIS — G40909 Epilepsy, unspecified, not intractable, without status epilepticus: Secondary | ICD-10-CM

## 2017-02-10 DIAGNOSIS — J181 Lobar pneumonia, unspecified organism: Secondary | ICD-10-CM

## 2017-02-10 DIAGNOSIS — E8779 Other fluid overload: Secondary | ICD-10-CM | POA: Diagnosis present

## 2017-02-10 DIAGNOSIS — J45909 Unspecified asthma, uncomplicated: Secondary | ICD-10-CM | POA: Diagnosis present

## 2017-02-10 DIAGNOSIS — Z87820 Personal history of traumatic brain injury: Secondary | ICD-10-CM

## 2017-02-10 DIAGNOSIS — F329 Major depressive disorder, single episode, unspecified: Secondary | ICD-10-CM | POA: Diagnosis present

## 2017-02-10 DIAGNOSIS — Z720 Tobacco use: Secondary | ICD-10-CM

## 2017-02-10 DIAGNOSIS — Z885 Allergy status to narcotic agent status: Secondary | ICD-10-CM

## 2017-02-10 DIAGNOSIS — M542 Cervicalgia: Secondary | ICD-10-CM | POA: Diagnosis present

## 2017-02-10 DIAGNOSIS — J9601 Acute respiratory failure with hypoxia: Secondary | ICD-10-CM | POA: Diagnosis present

## 2017-02-10 DIAGNOSIS — R0902 Hypoxemia: Secondary | ICD-10-CM

## 2017-02-10 DIAGNOSIS — Z8249 Family history of ischemic heart disease and other diseases of the circulatory system: Secondary | ICD-10-CM

## 2017-02-10 DIAGNOSIS — Z765 Malingerer [conscious simulation]: Secondary | ICD-10-CM

## 2017-02-10 DIAGNOSIS — R0781 Pleurodynia: Secondary | ICD-10-CM

## 2017-02-10 DIAGNOSIS — Z79899 Other long term (current) drug therapy: Secondary | ICD-10-CM

## 2017-02-10 DIAGNOSIS — G8929 Other chronic pain: Secondary | ICD-10-CM | POA: Diagnosis present

## 2017-02-10 DIAGNOSIS — E785 Hyperlipidemia, unspecified: Secondary | ICD-10-CM | POA: Diagnosis present

## 2017-02-10 DIAGNOSIS — A419 Sepsis, unspecified organism: Principal | ICD-10-CM | POA: Diagnosis present

## 2017-02-10 DIAGNOSIS — E784 Other hyperlipidemia: Secondary | ICD-10-CM

## 2017-02-10 DIAGNOSIS — Z880 Allergy status to penicillin: Secondary | ICD-10-CM

## 2017-02-10 DIAGNOSIS — B001 Herpesviral vesicular dermatitis: Secondary | ICD-10-CM | POA: Diagnosis present

## 2017-02-10 HISTORY — DX: Headache, unspecified: R51.9

## 2017-02-10 HISTORY — DX: Pneumonia, unspecified organism: J18.9

## 2017-02-10 HISTORY — DX: Anemia, unspecified: D64.9

## 2017-02-10 HISTORY — DX: Depression, unspecified: F32.A

## 2017-02-10 HISTORY — DX: Other chronic pain: G89.29

## 2017-02-10 HISTORY — DX: Major depressive disorder, single episode, unspecified: F32.9

## 2017-02-10 HISTORY — DX: Headache: R51

## 2017-02-10 HISTORY — DX: Unspecified osteoarthritis, unspecified site: M19.90

## 2017-02-10 HISTORY — DX: Anxiety disorder, unspecified: F41.9

## 2017-02-10 HISTORY — DX: Pure hypercholesterolemia, unspecified: E78.00

## 2017-02-10 HISTORY — DX: Dorsalgia, unspecified: M54.9

## 2017-02-10 HISTORY — DX: Cervicalgia: M54.2

## 2017-02-10 HISTORY — DX: Chronic obstructive pulmonary disease, unspecified: J44.9

## 2017-02-10 HISTORY — DX: Unspecified intracranial injury with loss of consciousness of unspecified duration, initial encounter: S06.9X9A

## 2017-02-10 HISTORY — DX: Gastro-esophageal reflux disease without esophagitis: K21.9

## 2017-02-10 LAB — COMPREHENSIVE METABOLIC PANEL
ALK PHOS: 109 U/L (ref 38–126)
ALT: 19 U/L (ref 14–54)
AST: 27 U/L (ref 15–41)
Albumin: 3.6 g/dL (ref 3.5–5.0)
Anion gap: 9 (ref 5–15)
BUN: 9 mg/dL (ref 6–20)
CALCIUM: 8.8 mg/dL — AB (ref 8.9–10.3)
CO2: 24 mmol/L (ref 22–32)
CREATININE: 0.98 mg/dL (ref 0.44–1.00)
Chloride: 105 mmol/L (ref 101–111)
Glucose, Bld: 143 mg/dL — ABNORMAL HIGH (ref 65–99)
Potassium: 2.9 mmol/L — ABNORMAL LOW (ref 3.5–5.1)
Sodium: 138 mmol/L (ref 135–145)
Total Bilirubin: 0.4 mg/dL (ref 0.3–1.2)
Total Protein: 7.3 g/dL (ref 6.5–8.1)

## 2017-02-10 LAB — CBC WITH DIFFERENTIAL/PLATELET
Basophils Absolute: 0 10*3/uL (ref 0.0–0.1)
Basophils Relative: 0 %
EOS PCT: 1 %
Eosinophils Absolute: 0.2 10*3/uL (ref 0.0–0.7)
HCT: 35.8 % — ABNORMAL LOW (ref 36.0–46.0)
HEMOGLOBIN: 11.9 g/dL — AB (ref 12.0–15.0)
LYMPHS ABS: 2.8 10*3/uL (ref 0.7–4.0)
Lymphocytes Relative: 14 %
MCH: 30.9 pg (ref 26.0–34.0)
MCHC: 33.2 g/dL (ref 30.0–36.0)
MCV: 93 fL (ref 78.0–100.0)
Monocytes Absolute: 0.9 10*3/uL (ref 0.1–1.0)
Monocytes Relative: 5 %
NEUTROS PCT: 80 %
Neutro Abs: 15.7 10*3/uL — ABNORMAL HIGH (ref 1.7–7.7)
Platelets: 372 10*3/uL (ref 150–400)
RBC: 3.85 MIL/uL — ABNORMAL LOW (ref 3.87–5.11)
RDW: 13.5 % (ref 11.5–15.5)
WBC: 19.7 10*3/uL — AB (ref 4.0–10.5)

## 2017-02-10 LAB — RAPID URINE DRUG SCREEN, HOSP PERFORMED
AMPHETAMINES: NOT DETECTED
BENZODIAZEPINES: NOT DETECTED
Barbiturates: NOT DETECTED
Cocaine: NOT DETECTED
OPIATES: POSITIVE — AB
TETRAHYDROCANNABINOL: NOT DETECTED

## 2017-02-10 LAB — LACTATE DEHYDROGENASE: LDH: 236 U/L — ABNORMAL HIGH (ref 98–192)

## 2017-02-10 LAB — ETHANOL: Alcohol, Ethyl (B): <5 mg/dL (ref <5)

## 2017-02-10 LAB — SEDIMENTATION RATE: Sed Rate: 52 mm/hr — ABNORMAL HIGH (ref 0–22)

## 2017-02-10 LAB — D-DIMER, QUANTITATIVE (NOT AT ARMC): D DIMER QUANT: 1 ug{FEU}/mL — AB (ref 0.00–0.50)

## 2017-02-10 LAB — I-STAT CG4 LACTIC ACID, ED: Lactic Acid, Venous: 2.59 mmol/L (ref 0.5–1.9)

## 2017-02-10 LAB — PROCALCITONIN: Procalcitonin: 0.14 ng/mL

## 2017-02-10 LAB — STREP PNEUMONIAE URINARY ANTIGEN: STREP PNEUMO URINARY ANTIGEN: NEGATIVE

## 2017-02-10 LAB — TROPONIN I

## 2017-02-10 MED ORDER — ALBUTEROL (5 MG/ML) CONTINUOUS INHALATION SOLN
10.0000 mg/h | INHALATION_SOLUTION | RESPIRATORY_TRACT | Status: DC
  Administered 2017-02-10: 10 mg/h via RESPIRATORY_TRACT
  Filled 2017-02-10 (×2): qty 20

## 2017-02-10 MED ORDER — METHYLPREDNISOLONE SODIUM SUCC 125 MG IJ SOLR
125.0000 mg | Freq: Once | INTRAMUSCULAR | Status: AC
  Administered 2017-02-10: 125 mg via INTRAVENOUS
  Filled 2017-02-10: qty 2

## 2017-02-10 MED ORDER — NICOTINE 21 MG/24HR TD PT24
21.0000 mg | MEDICATED_PATCH | Freq: Every day | TRANSDERMAL | Status: DC
  Administered 2017-02-10 – 2017-02-15 (×6): 21 mg via TRANSDERMAL
  Filled 2017-02-10 (×7): qty 1

## 2017-02-10 MED ORDER — SODIUM CHLORIDE 0.9 % IV BOLUS (SEPSIS)
2000.0000 mL | Freq: Once | INTRAVENOUS | Status: AC
  Administered 2017-02-10: 2000 mL via INTRAVENOUS

## 2017-02-10 MED ORDER — VANCOMYCIN HCL IN DEXTROSE 1-5 GM/200ML-% IV SOLN
1000.0000 mg | Freq: Once | INTRAVENOUS | Status: AC
  Administered 2017-02-10: 1000 mg via INTRAVENOUS
  Filled 2017-02-10: qty 200

## 2017-02-10 MED ORDER — SODIUM CHLORIDE 0.9 % IV SOLN
INTRAVENOUS | Status: DC
  Administered 2017-02-10 – 2017-02-11 (×3): via INTRAVENOUS

## 2017-02-10 MED ORDER — LORAZEPAM 1 MG PO TABS
1.0000 mg | ORAL_TABLET | Freq: Four times a day (QID) | ORAL | Status: DC | PRN
  Administered 2017-02-10 – 2017-02-12 (×3): 1 mg via ORAL
  Filled 2017-02-10 (×3): qty 1

## 2017-02-10 MED ORDER — LEVETIRACETAM 500 MG PO TABS
500.0000 mg | ORAL_TABLET | Freq: Two times a day (BID) | ORAL | Status: DC
  Administered 2017-02-10 – 2017-02-15 (×11): 500 mg via ORAL
  Filled 2017-02-10 (×11): qty 1

## 2017-02-10 MED ORDER — HYDROCOD POLST-CPM POLST ER 10-8 MG/5ML PO SUER
5.0000 mL | Freq: Two times a day (BID) | ORAL | Status: DC | PRN
  Administered 2017-02-10 – 2017-02-15 (×11): 5 mL via ORAL
  Filled 2017-02-10 (×11): qty 5

## 2017-02-10 MED ORDER — IPRATROPIUM BROMIDE 0.02 % IN SOLN
RESPIRATORY_TRACT | Status: AC
  Administered 2017-02-10: 0.5 mg
  Filled 2017-02-10: qty 2.5

## 2017-02-10 MED ORDER — LEVOFLOXACIN IN D5W 750 MG/150ML IV SOLN
750.0000 mg | INTRAVENOUS | Status: DC
  Administered 2017-02-11: 750 mg via INTRAVENOUS
  Filled 2017-02-10: qty 150

## 2017-02-10 MED ORDER — ACETAMINOPHEN 325 MG PO TABS
650.0000 mg | ORAL_TABLET | ORAL | Status: DC | PRN
  Administered 2017-02-10 – 2017-02-12 (×3): 650 mg via ORAL
  Filled 2017-02-10 (×3): qty 2

## 2017-02-10 MED ORDER — IOPAMIDOL (ISOVUE-370) INJECTION 76%
100.0000 mL | Freq: Once | INTRAVENOUS | Status: AC | PRN
  Administered 2017-02-10: 100 mL via INTRAVENOUS

## 2017-02-10 MED ORDER — GABAPENTIN 300 MG PO CAPS
300.0000 mg | ORAL_CAPSULE | Freq: Three times a day (TID) | ORAL | Status: DC
  Administered 2017-02-10 – 2017-02-15 (×16): 300 mg via ORAL
  Filled 2017-02-10 (×16): qty 1

## 2017-02-10 MED ORDER — NICOTINE 21 MG/24HR TD PT24: 21.0000 mg | MEDICATED_PATCH | Freq: Every day | TRANSDERMAL | Status: DC

## 2017-02-10 MED ORDER — KETOROLAC TROMETHAMINE 30 MG/ML IJ SOLN
30.0000 mg | Freq: Once | INTRAMUSCULAR | Status: AC
  Administered 2017-02-10: 30 mg via INTRAVENOUS
  Filled 2017-02-10: qty 1

## 2017-02-10 MED ORDER — LEVALBUTEROL HCL 0.63 MG/3ML IN NEBU
0.6300 mg | INHALATION_SOLUTION | Freq: Three times a day (TID) | RESPIRATORY_TRACT | Status: DC | PRN
  Administered 2017-02-10 – 2017-02-13 (×5): 0.63 mg via RESPIRATORY_TRACT
  Filled 2017-02-10 (×6): qty 3

## 2017-02-10 MED ORDER — KETOROLAC TROMETHAMINE 15 MG/ML IJ SOLN
15.0000 mg | Freq: Four times a day (QID) | INTRAMUSCULAR | Status: DC
  Administered 2017-02-10 – 2017-02-11 (×5): 15 mg via INTRAVENOUS
  Filled 2017-02-10 (×5): qty 1

## 2017-02-10 MED ORDER — THIAMINE HCL 100 MG/ML IJ SOLN
100.0000 mg | Freq: Every day | INTRAMUSCULAR | Status: DC
  Filled 2017-02-10: qty 2

## 2017-02-10 MED ORDER — FOLIC ACID 1 MG PO TABS
1.0000 mg | ORAL_TABLET | Freq: Every day | ORAL | Status: DC
  Administered 2017-02-10 – 2017-02-15 (×6): 1 mg via ORAL
  Filled 2017-02-10 (×6): qty 1

## 2017-02-10 MED ORDER — LEVOFLOXACIN IN D5W 750 MG/150ML IV SOLN
750.0000 mg | Freq: Once | INTRAVENOUS | Status: AC
  Administered 2017-02-10: 750 mg via INTRAVENOUS
  Filled 2017-02-10: qty 150

## 2017-02-10 MED ORDER — DIPHENHYDRAMINE HCL 25 MG PO CAPS
50.0000 mg | ORAL_CAPSULE | Freq: Once | ORAL | Status: AC
  Administered 2017-02-10: 50 mg via ORAL
  Filled 2017-02-10: qty 2

## 2017-02-10 MED ORDER — PANTOPRAZOLE SODIUM 40 MG PO TBEC
40.0000 mg | DELAYED_RELEASE_TABLET | Freq: Every day | ORAL | Status: DC
Start: 2017-02-10 — End: 2017-02-15
  Administered 2017-02-11 – 2017-02-15 (×5): 40 mg via ORAL
  Filled 2017-02-10 (×5): qty 1

## 2017-02-10 MED ORDER — POTASSIUM CHLORIDE 10 MEQ/100ML IV SOLN
10.0000 meq | INTRAVENOUS | Status: AC
  Administered 2017-02-10 (×2): 10 meq via INTRAVENOUS
  Filled 2017-02-10 (×2): qty 100

## 2017-02-10 MED ORDER — HYDROXYZINE HCL 25 MG PO TABS
50.0000 mg | ORAL_TABLET | Freq: Three times a day (TID) | ORAL | Status: DC | PRN
  Administered 2017-02-12: 50 mg via ORAL
  Filled 2017-02-10 (×3): qty 2

## 2017-02-10 MED ORDER — VALACYCLOVIR HCL 500 MG PO TABS
1000.0000 mg | ORAL_TABLET | Freq: Two times a day (BID) | ORAL | Status: DC
  Administered 2017-02-10 – 2017-02-14 (×8): 1000 mg via ORAL
  Filled 2017-02-10 (×10): qty 2

## 2017-02-10 MED ORDER — ADULT MULTIVITAMIN W/MINERALS CH
1.0000 | ORAL_TABLET | Freq: Every day | ORAL | Status: DC
  Administered 2017-02-10 – 2017-02-15 (×6): 1 via ORAL
  Filled 2017-02-10 (×6): qty 1

## 2017-02-10 MED ORDER — FLUOXETINE HCL 20 MG PO CAPS
20.0000 mg | ORAL_CAPSULE | Freq: Every day | ORAL | Status: DC
  Administered 2017-02-10 – 2017-02-15 (×6): 20 mg via ORAL
  Filled 2017-02-10 (×6): qty 1

## 2017-02-10 MED ORDER — ENOXAPARIN SODIUM 40 MG/0.4ML ~~LOC~~ SOLN
40.0000 mg | SUBCUTANEOUS | Status: DC
  Administered 2017-02-11 – 2017-02-14 (×4): 40 mg via SUBCUTANEOUS
  Filled 2017-02-10 (×4): qty 0.4

## 2017-02-10 MED ORDER — BENZONATATE 100 MG PO CAPS
200.0000 mg | ORAL_CAPSULE | Freq: Three times a day (TID) | ORAL | Status: DC | PRN
  Administered 2017-02-10 – 2017-02-15 (×12): 200 mg via ORAL
  Filled 2017-02-10 (×12): qty 2

## 2017-02-10 MED ORDER — LORAZEPAM 2 MG/ML IJ SOLN
1.0000 mg | Freq: Four times a day (QID) | INTRAMUSCULAR | Status: DC | PRN
  Administered 2017-02-11 – 2017-02-12 (×3): 1 mg via INTRAVENOUS
  Filled 2017-02-10 (×3): qty 1

## 2017-02-10 MED ORDER — VITAMIN B-1 100 MG PO TABS
100.0000 mg | ORAL_TABLET | Freq: Every day | ORAL | Status: DC
  Administered 2017-02-10 – 2017-02-15 (×6): 100 mg via ORAL
  Filled 2017-02-10 (×6): qty 1

## 2017-02-10 NOTE — ED Notes (Signed)
Pt speaking in complete sentences without changes in O2 sats.  Noted noisy inspiration at throat but it is not consistent.

## 2017-02-10 NOTE — ED Notes (Signed)
Patient requesting to go outside after treatment.  RN aware

## 2017-02-10 NOTE — ED Notes (Addendum)
CAT restarted after CT.
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
## 2017-02-10 NOTE — ED Triage Notes (Signed)
Pt having URI symptoms, with cough, difficulty taking deep breaths and sob.  Pt has history of asthma and bronchitis.  Pt started having chest pain today with severe sob.

## 2017-02-10 NOTE — Progress Notes (Signed)
   Pt coming from from [facility] for treatment of a bilateral lobar pneumonia and acute hypoxemic respiratory failure with associated chest pain that is likely pleuritic in nature. Pt is stable and appropriate for Telemetry bed. Accepted under observation status to the [facility] team.   Linna Darner, MD [facility] 02/10/2017, 11:58 AM

## 2017-02-10 NOTE — ED Notes (Signed)
XR at bedside

## 2017-02-10 NOTE — ED Provider Notes (Signed)
[facility] DEPT [facility] Provider Note   CSN: [id number] Arrival date & time: 02/10/17  1062     History   Chief Complaint Chief Complaint  Patient presents with  . Cough  . Chest Pain    HPI Christine Campbell is a 41 y.o. female.  HPI Patient with history of asthma and chronic bronchitis presents with shortness of breath, wheezing, nonproductive cough and central chest pain starting today. Describes the chest pain is sharp and worse with deep breathing. No fever or chills. No new lower extremity swelling or pain. No recent extended travel. States she used her nebulizer at home with little improvement of her symptoms. Past Medical History:  Diagnosis Date  . Asthma   . Brain injury ([place])   . Bronchitis, chronic ([place])   . Chronic back pain   . Chronic neck pain   . Hypoglycemia   . Migraine   . Seizures [facility])     Patient Active Problem List   Diagnosis Date Noted  . Respiratory failure ([place]) 02/10/2017  . Anxiety 01/14/2017  . Herpes labialis 01/14/2017  . Chronic pain 01/14/2017  . GERD (gastroesophageal reflux disease) 12/24/2016  . Back pain 12/24/2016  . Neck pain 12/24/2016  . Hyperlipidemia 12/24/2016  . 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 Interstitial lung disease ([place]) 11/21/2013  . Seizures ([place]) 11/21/2013    Past Surgical History:  Procedure Laterality Date  . traumatic brain injury    . TUBAL LIGATION    . VIDEO BRONCHOSCOPY Bilateral 11/23/2013   Procedure: VIDEO BRONCHOSCOPY WITH FLUORO;  Surgeon: Rigoberto Noel, MD;  Location: [place];  Service: Cardiopulmonary;  Laterality: Bilateral;    OB History    Gravida Para Term Preterm AB Living   6 3 3  0 3 3   SAB TAB Ectopic Multiple Live Births   1 1             Home Medications    Prior to Admission medications   Medication Sig Start Date End Date Taking? Authorizing Provider  FLUoxetine (PROZAC) 20 MG tablet Take 1 tablet (20 mg total)  by mouth daily. 01/16/17  Yes Arnoldo Morale, MD  gabapentin (NEURONTIN) 300 MG capsule Take 1 capsule (300 mg total) by mouth 3 (three) times daily. 12/24/16  Yes Arnoldo Morale, MD  levETIRAcetam (KEPPRA) 500 MG tablet Take 1 tablet (500 mg total) by mouth 2 (two) times daily. 11/26/16  Yes Arnoldo Morale, MD  omeprazole (PRILOSEC) 20 MG capsule Take 1 capsule (20 mg total) by mouth daily. 12/24/16  Yes Arnoldo Morale, MD  umeclidinium bromide (INCRUSE ELLIPTA) 62.5 MCG/INH AEPB Inhale 1 puff into the lungs daily. 01/13/17  Yes Arnoldo Morale, MD  valACYclovir (VALTREX) 1000 MG tablet TAKE 1 TABLET BY MOUTH 2 TIMES DAILY. 02/09/17  Yes Arnoldo Morale, MD  albuterol (PROVENTIL HFA;VENTOLIN HFA) 108 (90 Base) MCG/ACT inhaler Inhale 2 puffs into the lungs every 4 (four) hours as needed for wheezing or shortness of breath. 01/20/17   Arnoldo Morale, MD  atorvastatin (LIPITOR) 20 MG tablet Take 1 tablet (20 mg total) by mouth daily. Patient not taking: Reported on 12/24/2016 12/05/16   Arnoldo Morale, MD    Family History Family History  Problem Relation Age of Onset  . Hypertension Mother   . Diabetes Mother   . Heart disease Maternal Uncle     Social History Social History  Substance Use Topics  . Smoking status: Light Tobacco Smoker    Packs/day: 0.25  Years: 24.00    Types: Cigarettes  . Smokeless tobacco: Never Used     Comment: 1-2 daily  . Alcohol use No     Allergies   Cephalosporins; Penicillins; Codeine; Erythromycin; Tramadol; Vicodin [hydrocodone-acetaminophen]; Erythromycin base; and Hydrocodone-acetaminophen   Review of Systems Review of Systems  Constitutional: Negative for chills and fever.  HENT: Negative for congestion, sore throat, trouble swallowing and voice change.   Respiratory: Positive for cough, shortness of breath and wheezing.   Cardiovascular: Positive for chest pain. Negative for palpitations and leg swelling.  Gastrointestinal: Negative for abdominal pain, diarrhea, nausea  and vomiting.  Musculoskeletal: Negative for back pain, myalgias, neck pain and neck stiffness.  Skin: Negative for rash and wound.  Neurological: Negative for weakness, numbness and headaches.  All other systems reviewed and are negative.    Physical Exam Updated Vital Signs BP 109/66   Pulse (!) 114   Temp 98.9 F (37.2 C) (Oral)   Resp (!) 31   Ht 5\' 1"  (1.549 m)   Wt 155 lb (70.3 kg)   LMP 12/22/2016   SpO2 97%   BMI 29.29 kg/m   Physical Exam  Constitutional: She is oriented to person, place, and time. She appears well-developed and well-nourished.  HENT:  Head: Normocephalic and atraumatic.  Mouth/Throat: Oropharynx is clear and moist.  Erythematous oropharynx. Uvula is midline. No trismus.  Eyes: EOM are normal. Pupils are equal, round, and reactive to light.  Neck: Normal range of motion. Neck supple.  Cardiovascular: Regular rhythm.   Tachycardia  Pulmonary/Chest: No stridor.  Increased respiratory effort. Shallow inspiration. Diminished bilateral bases.  Abdominal: Soft. Bowel sounds are normal. There is no tenderness. There is no rebound and no guarding.  Musculoskeletal: Normal range of motion. She exhibits no edema or tenderness.  No lower extremity swelling, asymmetry or tenderness.  Lymphadenopathy:    She has no cervical adenopathy.  Neurological: She is alert and oriented to person, place, and time.  Moving all extremities without deficit. Sensation fully intact.  Skin: Skin is warm and dry. Capillary refill takes less than 2 seconds. No rash noted. No erythema.  Psychiatric: She has a normal mood and affect. Her behavior is normal.  Nursing note and vitals reviewed.    ED Treatments / Results  Labs (all labs ordered are listed, but only abnormal results are displayed) Labs Reviewed  CBC WITH DIFFERENTIAL/PLATELET - Abnormal; Notable for the following:       Result Value   WBC 19.7 (*)    RBC 3.85 (*)    Hemoglobin 11.9 (*)    HCT 35.8 (*)     Neutro Abs 15.7 (*)    All other components within normal limits  COMPREHENSIVE METABOLIC PANEL - Abnormal; Notable for the following:    Potassium 2.9 (*)    Glucose, Bld 143 (*)    Calcium 8.8 (*)    All other components within normal limits  D-DIMER, QUANTITATIVE (NOT AT [facility]) - Abnormal; Notable for the following:    D-Dimer, Quant 1.00 (*)    All other components within normal limits  I-STAT CG4 LACTIC ACID, ED - Abnormal; Notable for the following:    Lactic Acid, Venous 2.59 (*)    All other components within normal limits  CULTURE, BLOOD (ROUTINE X 2)  CULTURE, BLOOD (ROUTINE X 2)  TROPONIN I    EKG  EKG Interpretation  Date/Time:  Tuesday February 10 2017 09:34:49 EDT Ventricular Rate:  115 PR Interval:    QRS  Duration: 85 QT Interval:  330 QTC Calculation: 457 R Axis:   49 Text Interpretation:  Sinus tachycardia Confirmed by Lita Mains  MD, Kapono Luhn ([id number]) on 02/10/2017 9:53:10 AM       Radiology Ct Angio Chest Pe W And/or Wo Contrast  Result Date: 02/10/2017 CLINICAL DATA:  Shortness of breath and cough EXAM: CT ANGIOGRAPHY CHEST WITH CONTRAST TECHNIQUE: Multidetector CT imaging of the chest was performed using the standard protocol during bolus administration of intravenous contrast. Multiplanar CT image reconstructions and MIPs were obtained to evaluate the vascular anatomy. CONTRAST:  100 mL Isovue 370. COMPARISON:  None. FINDINGS: Cardiovascular: Thoracic aorta shows no aneurysmal dilatation or dissection. Pulmonary artery demonstrates a normal branching pattern without filling defect to suggest pulmonary embolism. No cardiac enlargement is seen. No coronary calcifications are noted. Mediastinum/Nodes: Thoracic inlet is within normal limits. No axillary adenopathy is identified. Scattered small hilar lymph nodes are seen. No significant mediastinal lymph adenopathy is noted. Lungs/Pleura: Lungs are well aerated bilaterally but demonstrate bilateral infiltrative changes  similar to that seen on recent chest x-ray. No sizable effusion is noted. No parenchymal nodules are seen. Upper Abdomen: Diffuse decreased attenuation liver is noted consistent with fatty infiltration. The remainder the upper abdomen is within normal limits. Musculoskeletal: The osseous structures are within normal limits. Review of the MIP images confirms the above findings. IMPRESSION: Bilateral pulmonary infiltrates No evidence of pulmonary emboli. Electronically Signed   By: Inez Catalina M.D.   On: 02/10/2017 11:07   Dg Chest Port 1 View  Result Date: 02/10/2017 CLINICAL DATA:  Chest pain.  Shortness of breath. EXAM: PORTABLE CHEST 1 VIEW COMPARISON:  05/05/2016 . FINDINGS: Mediastinum and hilar structures are normal. Heart size normal. Bilateral pulmonary infiltrates noted. No pleural effusion or pneumothorax IMPRESSION: Bilateral pulmonary infiltrates noted. Findings consist with pneumonia. Electronically Signed   By: Marcello Moores  Register   On: 02/10/2017 10:05    Procedures Procedures (including critical care time)  Medications Ordered in ED Medications  albuterol (PROVENTIL,VENTOLIN) solution continuous neb (10 mg/hr Nebulization New Bag/Given 02/10/17 1009)  levofloxacin (LEVAQUIN) IVPB 750 mg (750 mg Intravenous New Bag/Given 02/10/17 1104)  vancomycin (VANCOCIN) IVPB 1000 mg/200 mL premix (1,000 mg Intravenous New Bag/Given 02/10/17 1115)  nicotine (NICODERM CQ - dosed in mg/24 hours) patch 21 mg (21 mg Transdermal Patch Applied 02/10/17 1129)  potassium chloride 10 mEq in 100 mL IVPB (10 mEq Intravenous New Bag/Given 02/10/17 1135)  methylPREDNISolone sodium succinate (SOLU-MEDROL) 125 mg/2 mL injection 125 mg (125 mg Intravenous Given 02/10/17 0952)  ipratropium (ATROVENT) 0.02 % nebulizer solution (0.5 mg  Given 02/10/17 1009)  ketorolac (TORADOL) 30 MG/ML injection 30 mg (30 mg Intravenous Given 02/10/17 1103)  iopamidol (ISOVUE-370) 76 % injection 100 mL (100 mLs Intravenous Contrast Given  02/10/17 1045)  sodium chloride 0.9 % bolus 2,000 mL (2,000 mLs Intravenous New Bag/Given 02/10/17 1132)   CRITICAL CARE Performed by: Lita Mains, Hildred Mollica Total critical care time: 25 minutes Critical care time was exclusive of separately billable procedures and treating other patients. Critical care was necessary to treat or prevent imminent or life-threatening deterioration. Critical care was time spent personally by me on the following activities: development of treatment plan with patient and/or surrogate as well as nursing, discussions with consultants, evaluation of patient's response to treatment, examination of patient, obtaining history from patient or surrogate, ordering and performing treatments and interventions, ordering and review of laboratory studies, ordering and review of radiographic studies, pulse oximetry and re-evaluation of patient's condition.  Initial Impression /  Assessment and Plan / ED Course  I have reviewed the triage vital signs and the nursing notes.  Pertinent labs & imaging results that were available during my care of the patient were reviewed by me and considered in my medical decision making (see chart for details).     Patient noted to desaturate to 88 with good waveform on pulse oximeter. Received hour-long breathing treatment with minimal relief. Given IV steroids. Chest x-ray demonstrates bilateral infiltrates. Given acuity of symptom onset, hypoxia and elevated d-dimer, CT imaging of the chest was performed. No underlying masses, PE or evidence of septic emboli. Start on take mice and Levaquin. Blood cultures were drawn and an initial lactic acid was mildly elevated. Given IV fluids and potassium replacement. Discuss with Dr. Barbaraann Faster, tried hospitalists. Will accept in transfer to [facility]  Final Clinical Impressions(s) / ED Diagnoses   Final diagnoses:  Community acquired pneumonia, unspecified laterality  Pleuritic chest pain  Hypoxia    New  Prescriptions New Prescriptions   No medications on file     Julianne Rice, MD 02/10/17 1205

## 2017-02-11 DIAGNOSIS — G8929 Other chronic pain: Secondary | ICD-10-CM

## 2017-02-11 DIAGNOSIS — B001 Herpesviral vesicular dermatitis: Secondary | ICD-10-CM

## 2017-02-11 DIAGNOSIS — M542 Cervicalgia: Secondary | ICD-10-CM

## 2017-02-11 DIAGNOSIS — J189 Pneumonia, unspecified organism: Secondary | ICD-10-CM

## 2017-02-11 DIAGNOSIS — E876 Hypokalemia: Secondary | ICD-10-CM

## 2017-02-11 DIAGNOSIS — Z87820 Personal history of traumatic brain injury: Secondary | ICD-10-CM

## 2017-02-11 LAB — RESPIRATORY PANEL BY PCR
ADENOVIRUS-RVPPCR: NOT DETECTED
Bordetella pertussis: NOT DETECTED
CORONAVIRUS HKU1-RVPPCR: NOT DETECTED
CORONAVIRUS NL63-RVPPCR: NOT DETECTED
CORONAVIRUS OC43-RVPPCR: NOT DETECTED
Chlamydophila pneumoniae: NOT DETECTED
Coronavirus 229E: NOT DETECTED
INFLUENZA A-RVPPCR: NOT DETECTED
Influenza B: NOT DETECTED
MYCOPLASMA PNEUMONIAE-RVPPCR: NOT DETECTED
Metapneumovirus: NOT DETECTED
PARAINFLUENZA VIRUS 1-RVPPCR: NOT DETECTED
PARAINFLUENZA VIRUS 4-RVPPCR: NOT DETECTED
Parainfluenza Virus 2: NOT DETECTED
Parainfluenza Virus 3: NOT DETECTED
Respiratory Syncytial Virus: NOT DETECTED
Rhinovirus / Enterovirus: NOT DETECTED

## 2017-02-11 LAB — BASIC METABOLIC PANEL
ANION GAP: 8 (ref 5–15)
BUN: 7 mg/dL (ref 6–20)
CALCIUM: 8.6 mg/dL — AB (ref 8.9–10.3)
CHLORIDE: 111 mmol/L (ref 101–111)
CO2: 22 mmol/L (ref 22–32)
Creatinine, Ser: 0.7 mg/dL (ref 0.44–1.00)
GFR calc Af Amer: >60 mL/min (ref >60)
GFR calc non Af Amer: >60 mL/min (ref >60)
GLUCOSE: 103 mg/dL — AB (ref 65–99)
POTASSIUM: 3.6 mmol/L (ref 3.5–5.1)
Sodium: 141 mmol/L (ref 135–145)

## 2017-02-11 LAB — HIV ANTIBODY (ROUTINE TESTING W REFLEX): HIV SCREEN 4TH GENERATION: NONREACTIVE

## 2017-02-11 LAB — LEGIONELLA PNEUMOPHILA SEROGP 1 UR AG: L. pneumophila Serogp 1 Ur Ag: NEGATIVE

## 2017-02-11 MED ORDER — GUAIFENESIN ER 600 MG PO TB12
1200.0000 mg | ORAL_TABLET | Freq: Two times a day (BID) | ORAL | Status: DC
  Administered 2017-02-11 – 2017-02-15 (×9): 1200 mg via ORAL
  Filled 2017-02-11 (×9): qty 2

## 2017-02-11 MED ORDER — SIMETHICONE 80 MG PO CHEW: 80.0000 mg | CHEWABLE_TABLET | Freq: Four times a day (QID) | ORAL | Status: DC | PRN

## 2017-02-11 MED ORDER — HYDROCODONE-HOMATROPINE 5-1.5 MG/5ML PO SYRP
5.0000 mL | ORAL_SOLUTION | Freq: Once | ORAL | Status: AC
  Administered 2017-02-11: 5 mL via ORAL
  Filled 2017-02-11: qty 5

## 2017-02-11 MED ORDER — POTASSIUM CHLORIDE CRYS ER 20 MEQ PO TBCR
40.0000 meq | EXTENDED_RELEASE_TABLET | Freq: Once | ORAL | Status: AC
  Administered 2017-02-11: 40 meq via ORAL
  Filled 2017-02-11: qty 2

## 2017-02-11 MED ORDER — MAGNESIUM SULFATE 2 GM/50ML IV SOLN
2.0000 g | Freq: Once | INTRAVENOUS | Status: AC
  Administered 2017-02-11: 2 g via INTRAVENOUS
  Filled 2017-02-11: qty 50

## 2017-02-11 MED ORDER — LEVOFLOXACIN 750 MG PO TABS
750.0000 mg | ORAL_TABLET | Freq: Every day | ORAL | Status: AC
  Administered 2017-02-12 – 2017-02-14 (×3): 750 mg via ORAL
  Filled 2017-02-11 (×3): qty 1

## 2017-02-11 MED ORDER — ORAL CARE MOUTH RINSE
15.0000 mL | Freq: Two times a day (BID) | OROMUCOSAL | Status: DC
  Administered 2017-02-12 – 2017-02-14 (×2): 15 mL via OROMUCOSAL

## 2017-02-11 MED ORDER — CALCIUM CARBONATE ANTACID 500 MG PO CHEW
2.0000 | CHEWABLE_TABLET | Freq: Three times a day (TID) | ORAL | Status: DC
  Administered 2017-02-11 – 2017-02-15 (×6): 400 mg via ORAL
  Filled 2017-02-11 (×9): qty 2

## 2017-02-11 MED ORDER — OXYCODONE-ACETAMINOPHEN 5-325 MG PO TABS
1.0000 | ORAL_TABLET | ORAL | Status: DC | PRN
  Administered 2017-02-11 – 2017-02-12 (×7): 1 via ORAL
  Filled 2017-02-11 (×7): qty 1

## 2017-02-11 NOTE — Progress Notes (Signed)
   02/11/17 1605  Clinical Encounter Type  Visited With Patient  Visit Type Follow-up  Spiritual Encounters  Spiritual Needs Emotional  Stress Factors  Patient Stress Factors Health changes  Pt fatigued and concerned about getting a soda. Referred her to nursing.

## 2017-02-11 NOTE — Progress Notes (Signed)
   02/11/17 1425  Clinical Encounter Type  Visited With Patient  Visit Type Other (Comment) ([place] consult)  Spiritual Encounters  Spiritual Needs Prayer  Stress Factors  Patient Stress Factors None identified  Introduction to Pt. Pt asked to come back later.

## 2017-02-11 NOTE — Significant Event (Signed)
Called twice by RN that patient complaining about inadequately controlled pain, she requested opioids. Patient currently on Toradol scheduled, she is on Percocet 5/325 every 4 hours. Patient also requested from the RN to call the doctor to come at bedside to discuss this matter before her and her daughter. No changes in medications for now, patient has known drug seeking behavior. She is on Neurontin, Tussionex, Toradol and Percocet as needed. Blood pressure is soft for IV narcotics, added K-pad.  Birdie Hopes Pager: [phone] 02/11/2017, 5:17 PM

## 2017-02-11 NOTE — Progress Notes (Signed)
Patient crying out stating that she in extreme pain, Dr. Hartford Poli notified, no change in orders at this time.  Patient requesting to see the Dr. Along with her daughter.  This was relayed to MD at this time.  Patient is stating that she demands that she is taken care of per [facility] stating that she isn't having her pain adequately controlled.
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
## 2017-02-12 DIAGNOSIS — R0902 Hypoxemia: Secondary | ICD-10-CM

## 2017-02-12 LAB — BASIC METABOLIC PANEL
ANION GAP: 6 (ref 5–15)
BUN: 6 mg/dL (ref 6–20)
CALCIUM: 8.6 mg/dL — AB (ref 8.9–10.3)
CO2: 23 mmol/L (ref 22–32)
CREATININE: 0.66 mg/dL (ref 0.44–1.00)
Chloride: 108 mmol/L (ref 101–111)
GFR calc Af Amer: >60 mL/min (ref >60)
GLUCOSE: 135 mg/dL — AB (ref 65–99)
Potassium: 4.3 mmol/L (ref 3.5–5.1)
Sodium: 137 mmol/L (ref 135–145)

## 2017-02-12 LAB — LACTIC ACID, PLASMA: Lactic Acid, Venous: 0.9 mmol/L (ref 0.5–1.9)

## 2017-02-12 LAB — PROCALCITONIN: PROCALCITONIN: 0.18 ng/mL

## 2017-02-12 LAB — MAGNESIUM: Magnesium: 1.9 mg/dL (ref 1.7–2.4)

## 2017-02-12 MED ORDER — OXYCODONE-ACETAMINOPHEN 5-325 MG PO TABS
1.0000 | ORAL_TABLET | ORAL | Status: DC | PRN
Start: 2017-02-12 — End: 2017-02-15
  Administered 2017-02-12 – 2017-02-15 (×20): 2 via ORAL
  Filled 2017-02-12 (×20): qty 2

## 2017-02-12 MED ORDER — ONDANSETRON 4 MG PO TBDP
4.0000 mg | ORAL_TABLET | Freq: Four times a day (QID) | ORAL | Status: DC | PRN
  Administered 2017-02-12: 4 mg via ORAL
  Filled 2017-02-12: qty 1

## 2017-02-12 NOTE — Progress Notes (Deleted)
Spoke with Dr. Coladonato of Nephrology in regards to foley. MD stated that if bladder scan <300 does not need foley at this time for strict I/O as pt is ambulatory and continent. Bladder scan showed maximum of 92. Pt educated to need for strict I/O - verbalized understanding.   Christine Wyeth S [place], RN 

## 2017-02-12 NOTE — Telephone Encounter (Signed)
Writer called patient who is presently in the hospital with pneumonia.  Pain management did get in touch with her and she has an appt with them on April 30th.  Patient will call [facility] and schedule an appt for her depression.

## 2017-02-12 NOTE — Progress Notes (Signed)
Patient O2 sats 77-80% on 4L. Patient unable to take deep breaths because it hurts too bad. Respiratory was called. Patient given breathing treatment. Patient continues to c/o of headache from the coughing. Patient states that, "nothing is helping". Will continue to monitor

## 2017-02-12 NOTE — Care Management Note (Signed)
Case Management Note  Patient Details  Name: Christine Campbell MRN: [id number] Date of Birth: 1975-12-22  Subjective/Objective:  Pt admitted on 02/10/17 with severe bilateral pneumonia.  PTA, pt independent of ADLS.                   Action/Plan: Will follow for discharge planning as pt progresses.  Pt is uninsured; may need medication assistance at discharge.  Pt lists Dr. Jarold Song as PCP.    Expected Discharge Date:                  Expected Discharge Plan:  Home/Self Care  In-House Referral:     Discharge planning Services  CM Consult  Post Acute Care Choice:    Choice offered to:     DME Arranged:    DME Agency:     HH Arranged:    HH Agency:     Status of Service:  In process, will continue to follow  If discussed at Long Length of Stay Meetings, dates discussed:    Additional Comments:  Ella Bodo, RN 02/12/2017, 2:45 PM
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
## 2017-02-13 ENCOUNTER — Inpatient Hospital Stay (HOSPITAL_COMMUNITY): Payer: MEDICAID

## 2017-02-13 LAB — BASIC METABOLIC PANEL
Anion gap: 9 (ref 5–15)
CHLORIDE: 104 mmol/L (ref 101–111)
CO2: 26 mmol/L (ref 22–32)
Calcium: 8.8 mg/dL — ABNORMAL LOW (ref 8.9–10.3)
Creatinine, Ser: 0.74 mg/dL (ref 0.44–1.00)
GFR calc Af Amer: >60 mL/min (ref >60)
GFR calc non Af Amer: >60 mL/min (ref >60)
Glucose, Bld: 155 mg/dL — ABNORMAL HIGH (ref 65–99)
POTASSIUM: 3.7 mmol/L (ref 3.5–5.1)
SODIUM: 139 mmol/L (ref 135–145)

## 2017-02-13 MED ORDER — FUROSEMIDE 10 MG/ML IJ SOLN
40.0000 mg | Freq: Once | INTRAMUSCULAR | Status: AC
  Administered 2017-02-13: 40 mg via INTRAVENOUS
  Filled 2017-02-13: qty 4

## 2017-02-13 MED ORDER — LEVALBUTEROL HCL 0.63 MG/3ML IN NEBU
0.6300 mg | INHALATION_SOLUTION | Freq: Three times a day (TID) | RESPIRATORY_TRACT | Status: DC
  Administered 2017-02-13 – 2017-02-15 (×8): 0.63 mg via RESPIRATORY_TRACT
  Filled 2017-02-13 (×8): qty 3

## 2017-02-13 MED ORDER — POTASSIUM CHLORIDE CRYS ER 20 MEQ PO TBCR
40.0000 meq | EXTENDED_RELEASE_TABLET | Freq: Once | ORAL | Status: AC
  Administered 2017-02-13: 40 meq via ORAL
  Filled 2017-02-13: qty 2
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
## 2017-02-14 LAB — BRAIN NATRIURETIC PEPTIDE: B NATRIURETIC PEPTIDE 5: 67.6 pg/mL (ref 0.0–100.0)

## 2017-02-14 LAB — BASIC METABOLIC PANEL
Anion gap: 8 (ref 5–15)
BUN: 10 mg/dL (ref 6–20)
CHLORIDE: 101 mmol/L (ref 101–111)
CO2: 30 mmol/L (ref 22–32)
Calcium: 8.8 mg/dL — ABNORMAL LOW (ref 8.9–10.3)
Creatinine, Ser: 0.71 mg/dL (ref 0.44–1.00)
GFR calc Af Amer: >60 mL/min (ref >60)
GFR calc non Af Amer: >60 mL/min (ref >60)
GLUCOSE: 102 mg/dL — AB (ref 65–99)
Potassium: 3.5 mmol/L (ref 3.5–5.1)
Sodium: 139 mmol/L (ref 135–145)

## 2017-02-14 MED ORDER — FUROSEMIDE 10 MG/ML IJ SOLN
40.0000 mg | Freq: Once | INTRAMUSCULAR | Status: AC
  Administered 2017-02-14: 40 mg via INTRAVENOUS
  Filled 2017-02-14: qty 4

## 2017-02-14 MED ORDER — LEVOFLOXACIN 750 MG PO TABS
750.0000 mg | ORAL_TABLET | Freq: Every day | ORAL | Status: DC
  Administered 2017-02-15: 750 mg via ORAL
  Filled 2017-02-14: qty 1

## 2017-02-14 MED ORDER — POTASSIUM CHLORIDE 20 MEQ/15ML (10%) PO SOLN
40.0000 meq | Freq: Four times a day (QID) | ORAL | Status: DC
  Filled 2017-02-14: qty 30

## 2017-02-14 MED ORDER — POTASSIUM CHLORIDE CRYS ER 20 MEQ PO TBCR
40.0000 meq | EXTENDED_RELEASE_TABLET | Freq: Four times a day (QID) | ORAL | Status: DC
  Filled 2017-02-14: qty 2

## 2017-02-14 MED ORDER — POTASSIUM CHLORIDE CRYS ER 20 MEQ PO TBCR
40.0000 meq | EXTENDED_RELEASE_TABLET | Freq: Four times a day (QID) | ORAL | Status: AC
  Administered 2017-02-14 (×2): 40 meq via ORAL
  Filled 2017-02-14 (×2): qty 2

## 2017-02-14 MED ORDER — LEVALBUTEROL HCL 0.63 MG/3ML IN NEBU: 0.6300 mg | INHALATION_SOLUTION | RESPIRATORY_TRACT | Status: DC | PRN

## 2017-02-14 NOTE — Progress Notes (Signed)
Pt complained of a fluttering in her chest/heart. Vital signs stable. EKG in chart. MD aware. Will continue to monitor.
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
## 2017-02-15 LAB — BASIC METABOLIC PANEL
Anion gap: 11 (ref 5–15)
BUN: 12 mg/dL (ref 6–20)
CALCIUM: 9.4 mg/dL (ref 8.9–10.3)
CO2: 29 mmol/L (ref 22–32)
Chloride: 96 mmol/L — ABNORMAL LOW (ref 101–111)
Creatinine, Ser: 0.8 mg/dL (ref 0.44–1.00)
GFR calc Af Amer: >60 mL/min (ref >60)
GLUCOSE: 105 mg/dL — AB (ref 65–99)
POTASSIUM: 3.8 mmol/L (ref 3.5–5.1)
Sodium: 136 mmol/L (ref 135–145)

## 2017-02-15 LAB — CULTURE, BLOOD (ROUTINE X 2)
CULTURE: NO GROWTH
Culture: NO GROWTH

## 2017-02-15 NOTE — Progress Notes (Signed)
Pt ambulated 200 ft in hallway on room air. Oxygen saturation remained 97% during walk. Pt tolerated well and returned to room.   Grant Fontana BSN, RN
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
## 2017-02-15 NOTE — Progress Notes (Signed)
Pt discharged home with family. IV and telemetry box removed. Pt received discharge instructions and all questions were answered. Pt discharged with all of her belongings. Pt left the unit via wheelchair and was accompanied by the charge nurse.   Grant Fontana BSN, RN

## 2017-02-16 ENCOUNTER — Encounter (HOSPITAL_COMMUNITY): Payer: Self-pay | Admitting: *Deleted

## 2017-02-16 ENCOUNTER — Telehealth (HOSPITAL_COMMUNITY): Payer: Self-pay | Admitting: *Deleted

## 2017-02-16 NOTE — Telephone Encounter (Signed)
Telephoned patient at home number and advised patient of negative pap smear results. HPV was negative. Next pap smear due in three years. Patient voiced understanding.

## 2017-02-17 ENCOUNTER — Telehealth: Payer: Self-pay
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
## 2017-02-19 ENCOUNTER — Inpatient Hospital Stay: Payer: Self-pay

## 2017-02-25 ENCOUNTER — Encounter: Payer: Self-pay | Admitting: Family Medicine

## 2017-02-25 ENCOUNTER — Ambulatory Visit: Payer: Self-pay | Attending: Family Medicine | Admitting: Family Medicine

## 2017-02-25 VITALS — BP 126/82 | HR 76 | Temp 98.0°F | Ht 61.0 in | Wt 157.0 lb

## 2017-02-25 DIAGNOSIS — J440 Chronic obstructive pulmonary disease with acute lower respiratory infection: Secondary | ICD-10-CM | POA: Insufficient documentation

## 2017-02-25 DIAGNOSIS — Z87820 Personal history of traumatic brain injury: Secondary | ICD-10-CM | POA: Insufficient documentation

## 2017-02-25 DIAGNOSIS — F329 Major depressive disorder, single episode, unspecified: Secondary | ICD-10-CM | POA: Insufficient documentation

## 2017-02-25 DIAGNOSIS — E784 Other hyperlipidemia: Secondary | ICD-10-CM | POA: Insufficient documentation

## 2017-02-25 DIAGNOSIS — E7849 Other hyperlipidemia: Secondary | ICD-10-CM

## 2017-02-25 DIAGNOSIS — Z885 Allergy status to narcotic agent status: Secondary | ICD-10-CM | POA: Insufficient documentation

## 2017-02-25 DIAGNOSIS — E7800 Pure hypercholesterolemia, unspecified: Secondary | ICD-10-CM | POA: Insufficient documentation

## 2017-02-25 DIAGNOSIS — Z79899 Other long term (current) drug therapy: Secondary | ICD-10-CM | POA: Insufficient documentation

## 2017-02-25 DIAGNOSIS — R569 Unspecified convulsions: Secondary | ICD-10-CM | POA: Insufficient documentation

## 2017-02-25 DIAGNOSIS — G4700 Insomnia, unspecified: Secondary | ICD-10-CM | POA: Insufficient documentation

## 2017-02-25 DIAGNOSIS — M549 Dorsalgia, unspecified: Secondary | ICD-10-CM | POA: Insufficient documentation

## 2017-02-25 DIAGNOSIS — R5383 Other fatigue: Secondary | ICD-10-CM

## 2017-02-25 DIAGNOSIS — Z888 Allergy status to other drugs, medicaments and biological substances status: Secondary | ICD-10-CM | POA: Insufficient documentation

## 2017-02-25 DIAGNOSIS — R630 Anorexia: Secondary | ICD-10-CM | POA: Insufficient documentation

## 2017-02-25 DIAGNOSIS — R000 Tachycardia, unspecified: Secondary | ICD-10-CM | POA: Insufficient documentation

## 2017-02-25 DIAGNOSIS — F419 Anxiety disorder, unspecified: Secondary | ICD-10-CM | POA: Insufficient documentation

## 2017-02-25 DIAGNOSIS — F418 Other specified anxiety disorders: Secondary | ICD-10-CM

## 2017-02-25 DIAGNOSIS — J181 Lobar pneumonia, unspecified organism: Secondary | ICD-10-CM | POA: Insufficient documentation

## 2017-02-25 DIAGNOSIS — K219 Gastro-esophageal reflux disease without esophagitis: Secondary | ICD-10-CM | POA: Insufficient documentation

## 2017-02-25 DIAGNOSIS — G8929 Other chronic pain: Secondary | ICD-10-CM | POA: Insufficient documentation

## 2017-02-25 DIAGNOSIS — Z880 Allergy status to penicillin: Secondary | ICD-10-CM | POA: Insufficient documentation

## 2017-02-25 DIAGNOSIS — Z9889 Other specified postprocedural states: Secondary | ICD-10-CM | POA: Insufficient documentation

## 2017-02-25 DIAGNOSIS — F17210 Nicotine dependence, cigarettes, uncomplicated: Secondary | ICD-10-CM | POA: Insufficient documentation

## 2017-02-25 DIAGNOSIS — J449 Chronic obstructive pulmonary disease, unspecified: Secondary | ICD-10-CM

## 2017-02-25 DIAGNOSIS — F32A Depression, unspecified: Secondary | ICD-10-CM

## 2017-02-25 MED ORDER — OMEPRAZOLE 20 MG PO CPDR: 20.0000 mg | DELAYED_RELEASE_CAPSULE | Freq: Every day | ORAL | 3 refills | Status: DC

## 2017-02-25 MED ORDER — ATORVASTATIN CALCIUM 20 MG PO TABS: 20.0000 mg | ORAL_TABLET | Freq: Every day | ORAL | 3 refills | Status: AC

## 2017-02-25 MED ORDER — BUSPIRONE HCL 7.5 MG PO TABS: 7.5000 mg | ORAL_TABLET | Freq: Two times a day (BID) | ORAL | 3 refills | Status: AC

## 2017-02-25 MED ORDER — FLUOXETINE HCL 20 MG PO TABS: 40.0000 mg | ORAL_TABLET | Freq: Every day | ORAL | 3 refills | Status: AC

## 2017-02-25 MED ORDER — LEVETIRACETAM 500 MG PO TABS: 500.0000 mg | ORAL_TABLET | Freq: Two times a day (BID) | ORAL | 3 refills | Status: AC

## 2017-02-25 NOTE — Progress Notes (Signed)
prozac not helping  Would like to have the nicoderm patch
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
## 2017-02-25 NOTE — Patient Instructions (Signed)
Generalized Anxiety Disorder, Adult Generalized anxiety disorder (GAD) is a mental health disorder. People with this condition constantly worry about everyday events. Unlike normal anxiety, worry related to GAD is not triggered by a specific event. These worries also do not fade or get better with time. GAD interferes with life functions, including relationships, work, and school. GAD can vary from mild to severe. People with severe GAD can have intense waves of anxiety with physical symptoms (panic attacks). What are the causes? The exact cause of GAD is not known. What increases the risk? This condition is more likely to develop in:  Women.  People who have a family history of anxiety disorders.  People who are very shy.  People who experience very stressful life events, such as the death of a loved one.  People who have a very stressful family environment. What are the signs or symptoms? People with GAD often worry excessively about many things in their lives, such as their health and family. They may also be overly concerned about:  Doing well at work.  Being on time.  Natural disasters.  Friendships. Physical symptoms of GAD include:  Fatigue.  Muscle tension or having muscle twitches.  Trembling or feeling shaky.  Being easily startled.  Feeling like your heart is pounding or racing.  Feeling out of breath or like you cannot take a deep breath.  Having trouble falling asleep or staying asleep.  Sweating.  Nausea, diarrhea, or irritable bowel syndrome (IBS).  Headaches.  Trouble concentrating or remembering facts.  Restlessness.  Irritability. How is this diagnosed? Your health care provider can diagnose GAD based on your symptoms and medical history. You will also have a physical exam. The health care provider will ask specific questions about your symptoms, including how severe they are, when they started, and if they come and go. Your health care  provider may ask you about your use of alcohol or drugs, including prescription medicines. Your health care provider may refer you to a mental health specialist for further evaluation. Your health care provider will do a thorough examination and may perform additional tests to rule out other possible causes of your symptoms. To be diagnosed with GAD, a person must have anxiety that:  Is out of his or her control.  Affects several different aspects of his or her life, such as work and relationships.  Causes distress that makes him or her unable to take part in normal activities.  Includes at least three physical symptoms of GAD, such as restlessness, fatigue, trouble concentrating, irritability, muscle tension, or sleep problems. Before your health care provider can confirm a diagnosis of GAD, these symptoms must be present more days than they are not, and they must last for six months or longer. How is this treated? The following therapies are usually used to treat GAD:  Medicine. Antidepressant medicine is usually prescribed for long-term daily control. Antianxiety medicines may be added in severe cases, especially when panic attacks occur.  Talk therapy (psychotherapy). Certain types of talk therapy can be helpful in treating GAD by providing support, education, and guidance. Options include:  Cognitive behavioral therapy (CBT). People learn coping skills and techniques to ease their anxiety. They learn to identify unrealistic or negative thoughts and behaviors and to replace them with positive ones.  Acceptance and commitment therapy (ACT). This treatment teaches people how to be mindful as a way to cope with unwanted thoughts and feelings.  Biofeedback. This process trains you to manage your body's response (  physiological response) through breathing techniques and relaxation methods. You will work with a therapist while machines are used to monitor your physical symptoms.  Stress  management techniques. These include yoga, meditation, and exercise. A mental health specialist can help determine which treatment is best for you. Some people see improvement with one type of therapy. However, other people require a combination of therapies. Follow these instructions at home:  Take over-the-counter and prescription medicines only as told by your health care provider.  Try to maintain a normal routine.  Try to anticipate stressful situations and allow extra time to manage them.  Practice any stress management or self-calming techniques as taught by your health care provider.  Do not punish yourself for setbacks or for not making progress.  Try to recognize your accomplishments, even if they are small.  Keep all follow-up visits as told by your health care provider. This is important. Contact a health care provider if:  Your symptoms do not get better.  Your symptoms get worse.  You have signs of depression, such as:  A persistently sad, cranky, or irritable mood.  Loss of enjoyment in activities that used to bring you joy.  Change in weight or eating.  Changes in sleeping habits.  Avoiding friends or family members.  Loss of energy for normal tasks.  Feelings of guilt or worthlessness. Get help right away if:  You have serious thoughts about hurting yourself or others. If you ever feel like you may hurt yourself or others, or have thoughts about taking your own life, get help right away. You can go to your nearest emergency department or call:  Your local emergency services (911 in the [place]  A suicide crisis helpline, such as the National Suicide Prevention Lifeline at 1-800-273-8255. This is open 24 hours a day. Summary  Generalized anxiety disorder (GAD) is a mental health disorder that involves worry that is not triggered by a specific event.  People with GAD often worry excessively about many things in their lives, such as their health and  family.  GAD may cause physical symptoms such as restlessness, trouble concentrating, sleep problems, frequent sweating, nausea, diarrhea, headaches, and trembling or muscle twitching.  A mental health specialist can help determine which treatment is best for you. Some people see improvement with one type of therapy. However, other people require a combination of therapies. This information is not intended to replace advice given to you by your health care provider. Make sure you discuss any questions you have with your health care provider. Document Released: 02/28/2013 Document Revised: 09/23/2016 Document Reviewed: 09/23/2016 Elsevier Interactive Patient Education  2017 [employer]  

## 2017-02-26 ENCOUNTER — Other Ambulatory Visit: Payer: Self-pay | Admitting: Family Medicine

## 2017-02-26 LAB — TSH: TSH: 2.74 u[IU]/mL (ref 0.450–4.500)

## 2017-02-26 LAB — VITAMIN D 25 HYDROXY (VIT D DEFICIENCY, FRACTURES): VIT D 25 HYDROXY: 18.9 ng/mL — AB (ref 30.0–100.0)

## 2017-02-26 MED ORDER — ERGOCALCIFEROL 1.25 MG (50000 UT) PO CAPS: 50000.0000 [IU] | ORAL_CAPSULE | ORAL | 0 refills | Status: DC

## 2017-02-26 MED FILL — VIT D2 1.25 MG (50,000 UNIT: 1.25 MG | 63 days supply | Qty: 9 | Fill #0

## 2017-03-03 MED FILL — busPIRone HCL 7.5 MG TABS: 7.5 | 30 days supply | Qty: 60 | Fill #0

## 2017-03-03 MED FILL — ?FLUOXETINE HCL 20MG TABLET: 20 | 30 days supply | Qty: 60 | Fill #0

## 2017-03-03 MED FILL — GABAPENTIN 300 MG CAPSULE: 300 | 30 days supply | Qty: 90 | Fill #3

## 2017-03-16 ENCOUNTER — Encounter: Payer: Self-pay | Admitting: Physical Medicine & Rehabilitation

## 2017-03-25 ENCOUNTER — Telehealth: Payer: Self-pay | Admitting: Family Medicine

## 2017-03-25 NOTE — Telephone Encounter (Signed)
Patient called the office asking to speak with PCP regarding her medications. Pt stated that she hasn't received her inhalers and seizures through the mail. Pt is worried because she out of medication. She may need samples in the meantime until things get corrected.  Thank you.

## 2017-03-26 NOTE — Telephone Encounter (Signed)
Unable to see were mail in prescription in preferred pharmacies. Left message on voicemail to call back.  Preferred pharmacies listed in chart are: [facility] and [facility].

## 2017-03-27 NOTE — Telephone Encounter (Signed)
Attempt to return call, unable to leave message on voicemail. Pt to be informed that the mail order prescriptions will come from the company.

## 2017-03-27 NOTE — Telephone Encounter (Signed)
Pt. Returned nurse call. Please f/u °

## 2017-03-30 ENCOUNTER — Other Ambulatory Visit: Payer: Self-pay | Admitting: Family Medicine

## 2017-03-30 DIAGNOSIS — J449 Chronic obstructive pulmonary disease, unspecified: Secondary | ICD-10-CM

## 2017-03-30 DIAGNOSIS — M542 Cervicalgia: Secondary | ICD-10-CM

## 2017-03-30 DIAGNOSIS — G8929 Other chronic pain: Secondary | ICD-10-CM

## 2017-03-30 DIAGNOSIS — M5441 Lumbago with sciatica, right side: Secondary | ICD-10-CM

## 2017-03-30 DIAGNOSIS — M5442 Lumbago with sciatica, left side: Secondary | ICD-10-CM

## 2017-03-30 NOTE — Telephone Encounter (Signed)
PT called to request a refill for the following med umeclidinium bromide (INCRUSE ELLIPTA) 62.5 MCG/INH AEPB  albuterol (PROVENTIL HFA;VENTOLIN HFA) 108 (90 Base) MCG/ACT inhaler  she been calling requesting this prescription and no one call her back, according to her please follow up

## 2017-03-31 MED ORDER — UMECLIDINIUM BROMIDE 62.5 MCG/INH IN AEPB
1.0000 | INHALATION_SPRAY | Freq: Every day | RESPIRATORY_TRACT | 3 refills | Status: DC
Start: 2017-03-31 — End: 2017-05-06

## 2017-03-31 MED ORDER — ALBUTEROL SULFATE HFA 108 (90 BASE) MCG/ACT IN AERS: 2.0000 | INHALATION_SPRAY | RESPIRATORY_TRACT | 3 refills | Status: AC | PRN

## 2017-03-31 MED FILL — INCRUSE ELLIPTA 62.5 MCG IN: 62.5 | 30 days supply | Qty: 30 | Fill #0

## 2017-03-31 MED FILL — !VENTOLIN HFA INHALER: 108 (90 BAS | 16 days supply | Qty: 18 | Fill #0

## 2017-03-31 NOTE — Telephone Encounter (Signed)
Patient was last seen 02/25/17. Patient is requesting refills on albuterol and incruse inhaler. Please advise. Albuterol was refilled 01/18/17 with 3 additional refills.

## 2017-03-31 NOTE — Telephone Encounter (Signed)
Refilled

## 2017-03-31 NOTE — Telephone Encounter (Signed)
Pt aware that medication will be available for pick up on 04/01/17: Informed she will will pick up Albuterol and Incruse Ellipta. Pharmacist will call back in reference to gabapentin.

## 2017-04-06 MED ORDER — GABAPENTIN 300 MG PO CAPS: 300.0000 mg | ORAL_CAPSULE | Freq: Three times a day (TID) | ORAL | 3 refills | Status: DC

## 2017-04-06 MED FILL — GABAPENTIN 300 MG CAPSULE: 300 | 30 days supply | Qty: 90 | Fill #0

## 2017-04-06 NOTE — Telephone Encounter (Signed)
Gabapentin refilled

## 2017-04-06 NOTE — Telephone Encounter (Signed)
Pt. Called requesting a refill on Gabapentin. Please f/u with pt.

## 2017-04-15 ENCOUNTER — Encounter: Payer: MEDICAID | Admitting: Physical Medicine & Rehabilitation

## 2017-04-29 ENCOUNTER — Encounter: Payer: Self-pay | Admitting: Family Medicine

## 2017-04-29 ENCOUNTER — Ambulatory Visit: Payer: Self-pay | Attending: Family Medicine | Admitting: Family Medicine

## 2017-04-29 ENCOUNTER — Other Ambulatory Visit: Payer: Self-pay | Admitting: Family Medicine

## 2017-04-29 VITALS — BP 150/101 | HR 74 | Temp 98.0°F | Resp 16 | Wt 153.0 lb

## 2017-04-29 DIAGNOSIS — R569 Unspecified convulsions: Secondary | ICD-10-CM

## 2017-04-29 DIAGNOSIS — R030 Elevated blood-pressure reading, without diagnosis of hypertension: Secondary | ICD-10-CM | POA: Insufficient documentation

## 2017-04-29 DIAGNOSIS — Z79899 Other long term (current) drug therapy: Secondary | ICD-10-CM | POA: Insufficient documentation

## 2017-04-29 DIAGNOSIS — F419 Anxiety disorder, unspecified: Secondary | ICD-10-CM | POA: Insufficient documentation

## 2017-04-29 DIAGNOSIS — M5441 Lumbago with sciatica, right side: Secondary | ICD-10-CM | POA: Insufficient documentation

## 2017-04-29 DIAGNOSIS — Z87820 Personal history of traumatic brain injury: Secondary | ICD-10-CM | POA: Insufficient documentation

## 2017-04-29 DIAGNOSIS — E7800 Pure hypercholesterolemia, unspecified: Secondary | ICD-10-CM | POA: Insufficient documentation

## 2017-04-29 DIAGNOSIS — G8929 Other chronic pain: Secondary | ICD-10-CM

## 2017-04-29 DIAGNOSIS — M5442 Lumbago with sciatica, left side: Secondary | ICD-10-CM | POA: Insufficient documentation

## 2017-04-29 DIAGNOSIS — J449 Chronic obstructive pulmonary disease, unspecified: Secondary | ICD-10-CM | POA: Insufficient documentation

## 2017-04-29 DIAGNOSIS — R102 Pelvic and perineal pain: Secondary | ICD-10-CM | POA: Insufficient documentation

## 2017-04-29 DIAGNOSIS — Z885 Allergy status to narcotic agent status: Secondary | ICD-10-CM | POA: Insufficient documentation

## 2017-04-29 DIAGNOSIS — Z880 Allergy status to penicillin: Secondary | ICD-10-CM | POA: Insufficient documentation

## 2017-04-29 DIAGNOSIS — K219 Gastro-esophageal reflux disease without esophagitis: Secondary | ICD-10-CM | POA: Insufficient documentation

## 2017-04-29 DIAGNOSIS — F329 Major depressive disorder, single episode, unspecified: Secondary | ICD-10-CM | POA: Insufficient documentation

## 2017-04-29 DIAGNOSIS — M542 Cervicalgia: Secondary | ICD-10-CM | POA: Insufficient documentation

## 2017-04-29 MED ORDER — FLUTICASONE FUROATE-VILANTEROL 100-25 MCG/INH IN AEPB: 1.0000 | INHALATION_SPRAY | Freq: Every day | RESPIRATORY_TRACT | 3 refills | Status: AC

## 2017-04-29 MED ORDER — GABAPENTIN 300 MG PO CAPS: 600.0000 mg | ORAL_CAPSULE | Freq: Two times a day (BID) | ORAL | 3 refills | Status: AC

## 2017-04-29 MED FILL — GABAPENTIN 300 MG CAPSULE: 300 | 30 days supply | Qty: 120 | Fill #0

## 2017-04-29 MED FILL — busPIRone HCL 7.5 MG TABS: 7.5 | 30 days supply | Qty: 60 | Fill #1

## 2017-04-29 MED FILL — ?LEVETIRACETAM 500 MG TABLE: 500 | 30 days supply | Qty: 60 | Fill #3

## 2017-04-29 MED FILL — ?FLUOXETINE HCL 20MG TABLET: 20 | 30 days supply | Qty: 60 | Fill #1
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
## 2017-05-01 MED FILL — !BREO ELLIPTA 100-25 MCG IN: 100-25 | 30 days supply | Qty: 60 | Fill #0

## 2017-05-03 ENCOUNTER — Emergency Department (HOSPITAL_BASED_OUTPATIENT_CLINIC_OR_DEPARTMENT_OTHER): Payer: Self-pay

## 2017-05-03 ENCOUNTER — Encounter (HOSPITAL_BASED_OUTPATIENT_CLINIC_OR_DEPARTMENT_OTHER): Payer: Self-pay | Admitting: Emergency Medicine

## 2017-05-03 ENCOUNTER — Inpatient Hospital Stay (HOSPITAL_BASED_OUTPATIENT_CLINIC_OR_DEPARTMENT_OTHER)
Admission: EM | Admit: 2017-05-03 | Discharge: 2017-05-05 | DRG: 193 | Disposition: A | Discharge status: Home / Self Care | Payer: Self-pay | Attending: Internal Medicine | Admitting: Internal Medicine

## 2017-05-03 DIAGNOSIS — Z880 Allergy status to penicillin: Secondary | ICD-10-CM

## 2017-05-03 DIAGNOSIS — G8929 Other chronic pain: Secondary | ICD-10-CM | POA: Diagnosis present

## 2017-05-03 DIAGNOSIS — Z79899 Other long term (current) drug therapy: Secondary | ICD-10-CM

## 2017-05-03 DIAGNOSIS — J440 Chronic obstructive pulmonary disease with acute lower respiratory infection: Secondary | ICD-10-CM | POA: Diagnosis present

## 2017-05-03 DIAGNOSIS — Z720 Tobacco use: Secondary | ICD-10-CM | POA: Diagnosis present

## 2017-05-03 DIAGNOSIS — G40909 Epilepsy, unspecified, not intractable, without status epilepticus: Secondary | ICD-10-CM

## 2017-05-03 DIAGNOSIS — J9601 Acute respiratory failure with hypoxia: Secondary | ICD-10-CM | POA: Diagnosis present

## 2017-05-03 DIAGNOSIS — J4489 Other specified chronic obstructive pulmonary disease: Secondary | ICD-10-CM | POA: Diagnosis present

## 2017-05-03 DIAGNOSIS — Z8249 Family history of ischemic heart disease and other diseases of the circulatory system: Secondary | ICD-10-CM

## 2017-05-03 DIAGNOSIS — Z885 Allergy status to narcotic agent status: Secondary | ICD-10-CM

## 2017-05-03 DIAGNOSIS — J449 Chronic obstructive pulmonary disease, unspecified: Secondary | ICD-10-CM

## 2017-05-03 DIAGNOSIS — M129 Arthropathy, unspecified: Secondary | ICD-10-CM | POA: Diagnosis present

## 2017-05-03 DIAGNOSIS — Z87820 Personal history of traumatic brain injury: Secondary | ICD-10-CM

## 2017-05-03 DIAGNOSIS — K219 Gastro-esophageal reflux disease without esophagitis: Secondary | ICD-10-CM | POA: Diagnosis present

## 2017-05-03 DIAGNOSIS — E876 Hypokalemia: Secondary | ICD-10-CM | POA: Diagnosis present

## 2017-05-03 DIAGNOSIS — Z833 Family history of diabetes mellitus: Secondary | ICD-10-CM

## 2017-05-03 DIAGNOSIS — J189 Pneumonia, unspecified organism: Principal | ICD-10-CM | POA: Diagnosis present

## 2017-05-03 DIAGNOSIS — G894 Chronic pain syndrome: Secondary | ICD-10-CM | POA: Diagnosis present

## 2017-05-03 DIAGNOSIS — Z91041 Radiographic dye allergy status: Secondary | ICD-10-CM

## 2017-05-03 DIAGNOSIS — J849 Interstitial pulmonary disease, unspecified: Secondary | ICD-10-CM | POA: Diagnosis present

## 2017-05-03 DIAGNOSIS — Z888 Allergy status to other drugs, medicaments and biological substances status: Secondary | ICD-10-CM

## 2017-05-03 DIAGNOSIS — F17210 Nicotine dependence, cigarettes, uncomplicated: Secondary | ICD-10-CM | POA: Diagnosis present

## 2017-05-03 LAB — CBC
HEMATOCRIT: 35.1 % — AB (ref 36.0–46.0)
HEMOGLOBIN: 11.5 g/dL — AB (ref 12.0–15.0)
MCH: 31.1 pg (ref 26.0–34.0)
MCHC: 32.8 g/dL (ref 30.0–36.0)
MCV: 94.9 fL (ref 78.0–100.0)
Platelets: 375 10*3/uL (ref 150–400)
RBC: 3.7 MIL/uL — ABNORMAL LOW (ref 3.87–5.11)
RDW: 14.9 % (ref 11.5–15.5)
WBC: 13.6 10*3/uL — ABNORMAL HIGH (ref 4.0–10.5)

## 2017-05-03 LAB — COMPREHENSIVE METABOLIC PANEL
ALK PHOS: 86 U/L (ref 38–126)
ALT: 17 U/L (ref 14–54)
ANION GAP: 9 (ref 5–15)
AST: 28 U/L (ref 15–41)
Albumin: 3.4 g/dL — ABNORMAL LOW (ref 3.5–5.0)
BILIRUBIN TOTAL: 0.3 mg/dL (ref 0.3–1.2)
BUN: 7 mg/dL (ref 6–20)
CALCIUM: 8.6 mg/dL — AB (ref 8.9–10.3)
CO2: 27 mmol/L (ref 22–32)
Chloride: 103 mmol/L (ref 101–111)
Creatinine, Ser: 0.63 mg/dL (ref 0.44–1.00)
GFR calc non Af Amer: >60 mL/min (ref >60)
Glucose, Bld: 121 mg/dL — ABNORMAL HIGH (ref 65–99)
Potassium: 2.9 mmol/L — ABNORMAL LOW (ref 3.5–5.1)
Sodium: 139 mmol/L (ref 135–145)
TOTAL PROTEIN: 6.7 g/dL (ref 6.5–8.1)

## 2017-05-03 LAB — D-DIMER, QUANTITATIVE: D-Dimer, Quant: 0.64 ug/mL-FEU — ABNORMAL HIGH (ref 0.00–0.50)

## 2017-05-03 LAB — LACTATE DEHYDROGENASE: LDH: 276 U/L — ABNORMAL HIGH (ref 98–192)

## 2017-05-03 LAB — TROPONIN I: Troponin I: <0.03 ng/mL (ref <0.03)

## 2017-05-03 LAB — I-STAT CG4 LACTIC ACID, ED: LACTIC ACID, VENOUS: 1.28 mmol/L (ref 0.5–1.9)

## 2017-05-03 LAB — PREGNANCY, URINE: PREG TEST UR: NEGATIVE

## 2017-05-03 MED ORDER — VANCOMYCIN HCL IN DEXTROSE 750-5 MG/150ML-% IV SOLN
750.0000 mg | Freq: Three times a day (TID) | INTRAVENOUS | Status: DC
  Administered 2017-05-03 – 2017-05-04 (×3): 750 mg via INTRAVENOUS
  Filled 2017-05-03 (×5): qty 150

## 2017-05-03 MED ORDER — ALBUTEROL SULFATE (2.5 MG/3ML) 0.083% IN NEBU: 2.5000 mg | INHALATION_SOLUTION | RESPIRATORY_TRACT | Status: DC | PRN

## 2017-05-03 MED ORDER — NICOTINE 14 MG/24HR TD PT24
14.0000 mg | MEDICATED_PATCH | Freq: Every day | TRANSDERMAL | Status: DC
Start: 2017-05-03 — End: 2017-05-05
  Administered 2017-05-03 – 2017-05-05 (×3): 14 mg via TRANSDERMAL
  Filled 2017-05-03 (×3): qty 1

## 2017-05-03 MED ORDER — ACETAMINOPHEN 325 MG PO TABS
650.0000 mg | ORAL_TABLET | Freq: Once | ORAL | Status: AC
  Administered 2017-05-03: 650 mg via ORAL
  Filled 2017-05-03: qty 2

## 2017-05-03 MED ORDER — OXYCODONE HCL 5 MG PO TABS
5.0000 mg | ORAL_TABLET | Freq: Once | ORAL | Status: AC
  Administered 2017-05-03: 5 mg via ORAL
  Filled 2017-05-03: qty 1

## 2017-05-03 MED ORDER — SODIUM CHLORIDE 0.9% FLUSH
3.0000 mL | Freq: Two times a day (BID) | INTRAVENOUS | Status: DC
  Administered 2017-05-03 – 2017-05-04 (×3): 3 mL via INTRAVENOUS

## 2017-05-03 MED ORDER — VANCOMYCIN HCL IN DEXTROSE 1-5 GM/200ML-% IV SOLN
1000.0000 mg | Freq: Once | INTRAVENOUS | Status: AC
Start: 2017-05-03 — End: 2017-05-03
  Administered 2017-05-03: 1000 mg via INTRAVENOUS
  Filled 2017-05-03: qty 200

## 2017-05-03 MED ORDER — KETOROLAC TROMETHAMINE 15 MG/ML IJ SOLN
15.0000 mg | Freq: Once | INTRAMUSCULAR | Status: AC
  Administered 2017-05-03: 15 mg via INTRAVENOUS
  Filled 2017-05-03: qty 1

## 2017-05-03 MED ORDER — SODIUM CHLORIDE 0.9% FLUSH: 3.0000 mL | INTRAVENOUS | Status: DC | PRN

## 2017-05-03 MED ORDER — IOPAMIDOL (ISOVUE-370) INJECTION 76%
100.0000 mL | Freq: Once | INTRAVENOUS | Status: AC | PRN
  Administered 2017-05-03: 100 mL via INTRAVENOUS

## 2017-05-03 MED ORDER — IPRATROPIUM-ALBUTEROL 0.5-2.5 (3) MG/3ML IN SOLN
3.0000 mL | Freq: Once | RESPIRATORY_TRACT | Status: AC
  Administered 2017-05-03: 3 mL via RESPIRATORY_TRACT
  Filled 2017-05-03: qty 3

## 2017-05-03 MED ORDER — PREDNISONE 20 MG PO TABS
40.0000 mg | ORAL_TABLET | Freq: Two times a day (BID) | ORAL | Status: DC
  Administered 2017-05-04 – 2017-05-05 (×3): 40 mg via ORAL
  Filled 2017-05-03 (×3): qty 2

## 2017-05-03 MED ORDER — SODIUM CHLORIDE 0.9 % IV SOLN
250.0000 mL | INTRAVENOUS | Status: DC | PRN
  Administered 2017-05-03: 250 mL via INTRAVENOUS

## 2017-05-03 MED ORDER — ACETAMINOPHEN 325 MG PO TABS: 650.0000 mg | ORAL_TABLET | Freq: Four times a day (QID) | ORAL | Status: DC | PRN

## 2017-05-03 MED ORDER — ONDANSETRON HCL 4 MG/2ML IJ SOLN: 4.0000 mg | Freq: Four times a day (QID) | INTRAMUSCULAR | Status: DC | PRN

## 2017-05-03 MED ORDER — ENOXAPARIN SODIUM 40 MG/0.4ML ~~LOC~~ SOLN
40.0000 mg | SUBCUTANEOUS | Status: DC
  Administered 2017-05-03 – 2017-05-04 (×2): 40 mg via SUBCUTANEOUS
  Filled 2017-05-03 (×2): qty 0.4

## 2017-05-03 MED ORDER — HYDRALAZINE HCL 20 MG/ML IJ SOLN
10.0000 mg | Freq: Four times a day (QID) | INTRAMUSCULAR | Status: DC | PRN
  Filled 2017-05-03: qty 0.5

## 2017-05-03 MED ORDER — ATORVASTATIN CALCIUM 10 MG PO TABS
20.0000 mg | ORAL_TABLET | Freq: Every day | ORAL | Status: DC
  Administered 2017-05-03 – 2017-05-05 (×3): 20 mg via ORAL
  Filled 2017-05-03 (×3): qty 2

## 2017-05-03 MED ORDER — ONDANSETRON HCL 4 MG PO TABS: 4.0000 mg | ORAL_TABLET | Freq: Four times a day (QID) | ORAL | Status: DC | PRN

## 2017-05-03 MED ORDER — DIPHENHYDRAMINE HCL 50 MG/ML IJ SOLN
25.0000 mg | Freq: Once | INTRAMUSCULAR | Status: AC
Start: 2017-05-03 — End: 2017-05-03
  Administered 2017-05-03: 25 mg via INTRAVENOUS
  Filled 2017-05-03: qty 1

## 2017-05-03 MED ORDER — OXYCODONE HCL 5 MG PO TABS
5.0000 mg | ORAL_TABLET | ORAL | Status: DC | PRN
  Administered 2017-05-03 – 2017-05-04 (×4): 5 mg via ORAL
  Filled 2017-05-03 (×4): qty 1

## 2017-05-03 MED ORDER — POTASSIUM CHLORIDE CRYS ER 20 MEQ PO TBCR
40.0000 meq | EXTENDED_RELEASE_TABLET | Freq: Once | ORAL | Status: AC
  Administered 2017-05-03: 40 meq via ORAL
  Filled 2017-05-03 (×2): qty 2

## 2017-05-03 MED ORDER — AZTREONAM 1 G IJ SOLR
INTRAMUSCULAR | Status: AC
  Filled 2017-05-03: qty 2

## 2017-05-03 MED ORDER — ADULT MULTIVITAMIN W/MINERALS CH
1.0000 | ORAL_TABLET | Freq: Every day | ORAL | Status: DC
  Administered 2017-05-03 – 2017-05-05 (×3): 1 via ORAL
  Filled 2017-05-03 (×3): qty 1

## 2017-05-03 MED ORDER — DEXTROSE 5 % IV SOLN
1.0000 g | Freq: Three times a day (TID) | INTRAVENOUS | Status: DC
  Administered 2017-05-03 – 2017-05-04 (×2): 1 g via INTRAVENOUS
  Filled 2017-05-03 (×3): qty 1

## 2017-05-03 MED ORDER — GABAPENTIN 300 MG PO CAPS
600.0000 mg | ORAL_CAPSULE | Freq: Two times a day (BID) | ORAL | Status: DC
  Administered 2017-05-03 – 2017-05-05 (×5): 600 mg via ORAL
  Filled 2017-05-03 (×4): qty 2

## 2017-05-03 MED ORDER — ACETAMINOPHEN 650 MG RE SUPP: 650.0000 mg | Freq: Four times a day (QID) | RECTAL | Status: DC | PRN

## 2017-05-03 MED ORDER — TIZANIDINE HCL 4 MG PO TABS: 4.0000 mg | ORAL_TABLET | Freq: Three times a day (TID) | ORAL | Status: DC | PRN

## 2017-05-03 MED ORDER — DEXTROSE 5 % IV SOLN
2.0000 g | Freq: Once | INTRAVENOUS | Status: AC
  Administered 2017-05-03: 2 g via INTRAVENOUS
  Filled 2017-05-03: qty 2

## 2017-05-03 MED ORDER — METHYLPREDNISOLONE SODIUM SUCC 125 MG IJ SOLR
125.0000 mg | Freq: Once | INTRAMUSCULAR | Status: AC
  Administered 2017-05-03: 125 mg via INTRAVENOUS
  Filled 2017-05-03: qty 2

## 2017-05-03 MED ORDER — POTASSIUM CHLORIDE CRYS ER 20 MEQ PO TBCR
40.0000 meq | EXTENDED_RELEASE_TABLET | Freq: Once | ORAL | Status: AC
  Administered 2017-05-03: 40 meq via ORAL
  Filled 2017-05-03: qty 2

## 2017-05-03 MED ORDER — FLUTICASONE FUROATE-VILANTEROL 100-25 MCG/INH IN AEPB
1.0000 | INHALATION_SPRAY | Freq: Every day | RESPIRATORY_TRACT | Status: DC
  Administered 2017-05-04 – 2017-05-05 (×2): 1 via RESPIRATORY_TRACT
  Filled 2017-05-03: qty 28

## 2017-05-03 MED ORDER — LEVETIRACETAM 500 MG PO TABS
500.0000 mg | ORAL_TABLET | Freq: Two times a day (BID) | ORAL | Status: DC
  Administered 2017-05-03 – 2017-05-05 (×4): 500 mg via ORAL
  Filled 2017-05-03 (×4): qty 1

## 2017-05-03 MED ORDER — BUSPIRONE HCL 5 MG PO TABS
7.5000 mg | ORAL_TABLET | Freq: Two times a day (BID) | ORAL | Status: DC
  Administered 2017-05-03 – 2017-05-05 (×4): 7.5 mg via ORAL
  Filled 2017-05-03 (×4): qty 2

## 2017-05-03 MED ORDER — FLUOXETINE HCL 20 MG PO CAPS
40.0000 mg | ORAL_CAPSULE | Freq: Every day | ORAL | Status: DC
  Administered 2017-05-03 – 2017-05-05 (×3): 40 mg via ORAL
  Filled 2017-05-03 (×5): qty 2

## 2017-05-03 NOTE — ED Notes (Signed)
Pt requesting pain medication, EDP made aware 
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
## 2017-05-03 NOTE — ED Triage Notes (Signed)
SOB x 3 days, using inhalers without relief. Hx of COPD and asthma.

## 2017-05-03 NOTE — Progress Notes (Signed)
Pharmacy Antibiotic Note  Christine Campbell is a 41 y.o. female admitted on 05/03/2017 with SOB x 3 days.  Pharmacy has been consulted for vancomycin dosing for PNA.  Azactam 2gm IV x 1 ordered by MD given anaphylaxis to PCN and cephalosporins.  Baseline labs reviewed.   Plan: - Vanc 1gm IV x 1, then 750mg  IV Q8H - Monitor renal fxn, clinical progress, vanc trough at Css - F/U with continuing Gram negative coverage (Azactam 1gm IV Q8H) - Consider additional KCL supplementation   Height: 5\' 1"  (154.9 cm) Weight: 152 lb (68.9 kg) IBW/kg (Calculated) : 47.8  Temp (24hrs), Avg:99.1 F (37.3 C), Min:99.1 F (37.3 C), Max:99.1 F (37.3 C)   Recent Labs Lab 05/03/17 0858  WBC 13.6*  CREATININE 0.63    Estimated Creatinine Clearance: 83.8 mL/min (by C-G formula based on SCr of 0.63 mg/dL).    Allergies  Allergen Reactions  . Cephalosporins Anaphylaxis  . Penicillins Anaphylaxis    Has patient had a PCN reaction causing immediate rash, facial/tongue/throat swelling, SOB or lightheadedness with hypotension: Yes Has patient had a PCN reaction causing severe rash involving mucus membranes or skin necrosis: No Has patient had a PCN reaction that required hospitalization No Has patient had a PCN reaction occurring within the last 10 years: No If all of the above answers are "NO", then may proceed with Cephalosporin use.   . Codeine Itching  . Erythromycin Hives  . Meloxicam Nausea And Vomiting  . Methocarbamol Nausea And Vomiting  . Tramadol Hives  . Vicodin [Hydrocodone-Acetaminophen] Hives  . Erythromycin Base Rash  . Hydrocodone-Acetaminophen Rash     Vanc 6/17 >> Azactam   6/17 BCx -     Deniah Saia D. Mina Marble, PharmD, BCPS Pager:  [phone] 05/03/2017, 9:47 AM

## 2017-05-03 NOTE — Progress Notes (Addendum)
Pharmacy Antibiotic Note  Christine Campbell is a 41 y.o. female admitted on 05/03/2017 with HCAP.  Pharmacy has been consulted for vanc/aztreonam dosing. Aztreonam 2 gm given at 10 am, vanc 1 gm given at 1048 am.   Plan: Vancomycin 750 mg  IV every 8 hours.  Goal trough 15-20 mcg/mL.  Aztreonam 1 gm IV q8h F/u renal fxn, wbc, temp, CXR, culture data  Height: 5\' 1"  (154.9 cm) Weight: 152 lb (68.9 kg) IBW/kg (Calculated) : 47.8  Temp (24hrs), Avg:98.6 F (37 C), Min:98.1 F (36.7 C), Max:99.1 F (37.3 C)   Recent Labs Lab 05/03/17 0858 05/03/17 0943  WBC 13.6*  --   CREATININE 0.63  --   LATICACIDVEN  --  1.28    Estimated Creatinine Clearance: 83.8 mL/min (by C-G formula based on SCr of 0.63 mg/dL).    Allergies  Allergen Reactions  . Cephalosporins Anaphylaxis  . Penicillins Anaphylaxis    Has patient had a PCN reaction causing immediate rash, facial/tongue/throat swelling, SOB or lightheadedness with hypotension: Yes Has patient had a PCN reaction causing severe rash involving mucus membranes or skin necrosis: No Has patient had a PCN reaction that required hospitalization No Has patient had a PCN reaction occurring within the last 10 years: No If all of the above answers are "NO", then may proceed with Cephalosporin use.   . Isovue [Iopamidol] Hives    Pt started sneezing post injection, itching, congestion, will need premeds prior to future scans with iv contrast  . Codeine Itching  . Erythromycin Hives  . Meloxicam Nausea And Vomiting  . Methocarbamol Nausea And Vomiting  . Tramadol Hives  . Vicodin [Hydrocodone-Acetaminophen] Hives  . Erythromycin Base Rash  . Hydrocodone-Acetaminophen Rash    Thank you for allowing pharmacy to be a part of this patient's care.  Eudelia Bunch, Pharm.D. [phone] 05/03/2017 5:19 PM

## 2017-05-03 NOTE — ED Notes (Signed)
EDP notified about pt's pain at this time.

## 2017-05-03 NOTE — ED Notes (Signed)
CT tech advised pt having mild allergic reaction to contrast. EDP notified.

## 2017-05-03 NOTE — ED Notes (Signed)
Pt denies tongue swelling or increase in SOB, reports itching and sneezing.
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
## 2017-05-03 NOTE — ED Notes (Signed)
[facility] arrived to transport pt 

## 2017-05-03 NOTE — ED Notes (Signed)
Pt had taken her self off the O2 to go to the restroom. Pt noted to have SpO2 at 86% on R/A. Pt placed back on 2L O2 and bedside commode placed in pt's room.

## 2017-05-04 DIAGNOSIS — J189 Pneumonia, unspecified organism: Principal | ICD-10-CM

## 2017-05-04 DIAGNOSIS — J9601 Acute respiratory failure with hypoxia: Secondary | ICD-10-CM

## 2017-05-04 DIAGNOSIS — Z720 Tobacco use: Secondary | ICD-10-CM

## 2017-05-04 LAB — RESPIRATORY PANEL BY PCR
ADENOVIRUS-RVPPCR: NOT DETECTED
Bordetella pertussis: NOT DETECTED
CORONAVIRUS HKU1-RVPPCR: NOT DETECTED
CORONAVIRUS NL63-RVPPCR: NOT DETECTED
CORONAVIRUS OC43-RVPPCR: NOT DETECTED
Chlamydophila pneumoniae: NOT DETECTED
Coronavirus 229E: NOT DETECTED
Influenza A: NOT DETECTED
Influenza B: NOT DETECTED
METAPNEUMOVIRUS-RVPPCR: NOT DETECTED
Mycoplasma pneumoniae: NOT DETECTED
PARAINFLUENZA VIRUS 2-RVPPCR: NOT DETECTED
PARAINFLUENZA VIRUS 3-RVPPCR: NOT DETECTED
Parainfluenza Virus 1: NOT DETECTED
Parainfluenza Virus 4: NOT DETECTED
RHINOVIRUS / ENTEROVIRUS - RVPPCR: NOT DETECTED
Respiratory Syncytial Virus: NOT DETECTED

## 2017-05-04 LAB — CBC
HCT: 35.2 % — ABNORMAL LOW (ref 36.0–46.0)
HEMOGLOBIN: 11.3 g/dL — AB (ref 12.0–15.0)
MCH: 30.5 pg (ref 26.0–34.0)
MCHC: 32.1 g/dL (ref 30.0–36.0)
MCV: 94.9 fL (ref 78.0–100.0)
Platelets: 379 10*3/uL (ref 150–400)
RBC: 3.71 MIL/uL — AB (ref 3.87–5.11)
RDW: 14.9 % (ref 11.5–15.5)
WBC: 23.3 10*3/uL — ABNORMAL HIGH (ref 4.0–10.5)

## 2017-05-04 LAB — COMPREHENSIVE METABOLIC PANEL
ALK PHOS: 91 U/L (ref 38–126)
ALT: 15 U/L (ref 14–54)
ANION GAP: 8 (ref 5–15)
AST: 19 U/L (ref 15–41)
Albumin: 3.4 g/dL — ABNORMAL LOW (ref 3.5–5.0)
BILIRUBIN TOTAL: 0.1 mg/dL — AB (ref 0.3–1.2)
BUN: 11 mg/dL (ref 6–20)
CALCIUM: 9.3 mg/dL (ref 8.9–10.3)
CO2: 26 mmol/L (ref 22–32)
Chloride: 106 mmol/L (ref 101–111)
Creatinine, Ser: 0.6 mg/dL (ref 0.44–1.00)
GFR calc non Af Amer: >60 mL/min (ref >60)
Glucose, Bld: 128 mg/dL — ABNORMAL HIGH (ref 65–99)
POTASSIUM: 4.1 mmol/L (ref 3.5–5.1)
SODIUM: 140 mmol/L (ref 135–145)
TOTAL PROTEIN: 7 g/dL (ref 6.5–8.1)

## 2017-05-04 LAB — MAGNESIUM: MAGNESIUM: 2 mg/dL (ref 1.7–2.4)

## 2017-05-04 LAB — MRSA PCR SCREENING: MRSA by PCR: NEGATIVE

## 2017-05-04 MED ORDER — GUAIFENESIN ER 600 MG PO TB12
600.0000 mg | ORAL_TABLET | Freq: Two times a day (BID) | ORAL | Status: DC
  Administered 2017-05-04 – 2017-05-05 (×2): 600 mg via ORAL
  Filled 2017-05-04 (×2): qty 1

## 2017-05-04 MED ORDER — AZTREONAM IN DEXTROSE 1 GM/50ML IV SOLN
1.0000 g | Freq: Three times a day (TID) | INTRAVENOUS | Status: DC
  Administered 2017-05-04 – 2017-05-05 (×3): 1 g via INTRAVENOUS
  Filled 2017-05-04 (×5): qty 50

## 2017-05-04 MED ORDER — SENNOSIDES-DOCUSATE SODIUM 8.6-50 MG PO TABS
1.0000 | ORAL_TABLET | Freq: Two times a day (BID) | ORAL | Status: DC
  Administered 2017-05-04: 1 via ORAL
  Filled 2017-05-04 (×2): qty 1

## 2017-05-04 MED ORDER — FAMOTIDINE 20 MG PO TABS
20.0000 mg | ORAL_TABLET | Freq: Every day | ORAL | Status: DC
  Administered 2017-05-04: 20 mg via ORAL
  Filled 2017-05-04: qty 1

## 2017-05-04 MED ORDER — OXYCODONE-ACETAMINOPHEN 5-325 MG PO TABS
1.0000 | ORAL_TABLET | Freq: Three times a day (TID) | ORAL | Status: DC | PRN
  Administered 2017-05-04 – 2017-05-05 (×3): 1 via ORAL
  Filled 2017-05-04 (×3): qty 1

## 2017-05-04 MED ORDER — BENZONATATE 100 MG PO CAPS
100.0000 mg | ORAL_CAPSULE | Freq: Three times a day (TID) | ORAL | Status: DC | PRN
Start: 2017-05-04 — End: 2017-05-05

## 2017-05-04 NOTE — Progress Notes (Signed)
Patient able to ambulated up and down hall with oxygen >95 percent on room air.

## 2017-05-04 NOTE — Care Management Note (Signed)
Case Management Note  Patient Details  Name: Christine Campbell MRN: [id number] Date of Birth: 01-06-1976  Subjective/Objective:                  41 y.o. female with a past medical history of COPD, chronic neck and back pain, multiple arthropathies, history of seizure disorder who was hospitalized in April with pneumonia. She was treated with antibiotics and was discharged home improved. She was saturating normal at the time of discharge. She presented to the emergency department today with complaints of shortness of breath, chest pain ongoing for 3 days. She's had a dry cough. She denies any fever. Some chills. No nausea, vomiting. Denies any leg swelling. No sick contacts. No recent travel. In the emergency department she was given oxygen and nebulizer treatment, which which she felt better. She is not on home oxygen. She was however found to be saturating in the mid to early 80s in the emergency department on room air. She also underwent CT scan of her chest which revealed groundglass opacities with concern for pneumonitis.  Action/Plan: Date:  May 04, 2017  Chart reviewed for concurrent status and case management needs.  Will continue to follow patient progress.  Discharge Planning: following for needs  Expected discharge date: [id number]  Velva Harman, BSN, [place], [place]   Expected Discharge Date:                  Expected Discharge Plan:  Home/Self Care  In-House Referral:     Discharge planning Services  CM Consult  Post Acute Care Choice:    Choice offered to:     DME Arranged:    DME Agency:     HH Arranged:    HH Agency:     Status of Service:  In process, will continue to follow  If discussed at Long Length of Stay Meetings, dates discussed:    Additional Comments:  Leeroy Cha, RN 05/04/2017, 9:47 AM

## 2017-05-04 NOTE — Consult Note (Signed)
Name: Christine Campbell MRN: [id number] DOB: 03-20-1976    ADMISSION DATE:  05/03/2017 CONSULTATION DATE:  05/04/2017  REFERRING MD :  Erlinda Hong, [facility]  CHIEF COMPLAINT:  Cough, dyspnea  HISTORY OF PRESENT ILLNESS:  41 year old smoker With chronic neck and back pain admitted why ED for shortness of breath and cough mostly dry for 3-4 days. She was noted to desaturate into the 80s. CT chest showed bilateral groundglass opacities which have been noted in 01/2017 and in 11/2013. Hence pulmonary was consulted.  She was initially seen by me in 11/2013 when she was admitted with dry cough and exertional dyspnea and similar findings and CT. Unfortunately bronchoscopy could not be completed due to very high sedation requirements. She was wide awake even after 8 mg of Versed and 175 g of fentanyl. We decided to focus on smoking cessation then. She has been lost to follow-up since then but I note multiple ER visits where she was treated for asthma. Interestingly, spirometry done in the office in 04/2014 had shown evidence of moderate airway obstruction She was hospitalized in 01/2017 for a "double pneumonia" when CT scan showed similar findings.  She was able to quit for about one month after last hospitalization with nicotine patches,but now has gone back to smoking-a pack lasts her about 4 days  She denies fevers, hemoptysis or weight loss. There is no significant occupational history. She lives with a friend in a mobile home, no pets, carpets or Obvious mold exposure   STUDIES:  11/2013 -admitted  with pleuritic chest pain, dry cough & mild exertional dyspnea. CT chest -Bilateral scattered ground-glass opacities  concerning for COP Bronchoscopy - unable to perform due to sedation issues (after versed 28m and 1717m Fentanyl was fully awake) .  ANA,RA factor was negative. ESR  75. HIV and ACE level were negative.   Spirometry 04/2014 >> severe airway obstrucn , poor effort, FEV1 46%- 1.28, ratio 54, FVC 73%  CT  angio chest 04/2017 -Groundglass opacities similar to 01/2017 and 11/2013-more likely chronic pneumonitis/interstitial pneumonia   PAST MEDICAL HISTORY :   has a past medical history of Anemia; Anxiety; Arthritis; Asthma; Bronchitis, chronic ([facility] Chronic back pain; Chronic neck pain; COPD (chronic obstructive pulmonary disease) (HCHad[place] Daily headache; Depression; GERD (gastroesophageal reflux disease); High cholesterol; Hypoglycemia; Hypoglycemia; Migraine; Pneumonia (11/2013; 02/10/2017); Seizures ([place] TBI (traumatic brain injury) ([place](08[place]2011); and Tuberculosis (11/2013).  has a past surgical history that includes Video bronchoscopy (Bilateral, 11/23/2013); Brain surgery (~ 2013); Dilation and curettage of uterus (2002); Tubal ligation (2006); Laceration repair (08/2011); and Wisdom tooth extraction (2002). Prior to Admission medications   Medication Sig Start Date End Date Taking? Authorizing Provider  acetaminophen (TYLENOL) 500 MG tablet Take 1,000 mg by mouth every 6 (six) hours as needed (pain).   Yes [provider]  albuterol (PROVENTIL HFA;VENTOLIN HFA) 108 (90 Base) MCG/ACT inhaler Inhale 2 puffs into the lungs every 4 (four) hours as needed for wheezing or shortness of breath. 03/31/17  Yes AmArnoldo MoraleMD  atorvastatin (LIPITOR) 20 MG tablet Take 1 tablet (20 mg total) by mouth daily. 02/25/17  Yes AmArnoldo MoraleMD  busPIRone (BUSPAR) 7.5 MG tablet Take 1 tablet (7.5 mg total) by mouth 2 (two) times daily. 02/25/17  Yes AmArnoldo MoraleMD  FLUoxetine (PROZAC) 20 MG tablet Take 2 tablets (40 mg total) by mouth daily. 02/25/17  Yes Amao, EnCharlane FerrettiMD  fluticasone furoate-vilanterol (BREO ELLIPTA) 100-25 MCG/INH AEPB Inhale 1 puff into the lungs daily. 04/29/17  Yes Amao, Enobong,  MD  gabapentin (NEURONTIN) 300 MG capsule Take 2 capsules (600 mg total) by mouth 2 (two) times daily. 04/29/17  Yes Arnoldo Morale, MD  ibuprofen (ADVIL,MOTRIN) 200 MG tablet Take 800 mg by mouth every 6 (six)  hours as needed (pain).   Yes [provider]  levETIRAcetam (KEPPRA) 500 MG tablet Take 1 tablet (500 mg total) by mouth 2 (two) times daily. 02/25/17  Yes Arnoldo Morale, MD  omeprazole (PRILOSEC) 20 MG capsule Take 1 capsule (20 mg total) by mouth daily. 02/25/17  Yes Arnoldo Morale, MD  tiZANidine (ZANAFLEX) 4 MG tablet Take 4 mg by mouth every 8 (eight) hours as needed for muscle spasms.  12/19/16  Yes [provider]  ergocalciferol (DRISDOL) 50000 units capsule Take 1 capsule (50,000 Units total) by mouth once a week. Patient not taking: Reported on 04/29/2017 02/26/17   Arnoldo Morale, MD  umeclidinium bromide (INCRUSE ELLIPTA) 62.5 MCG/INH AEPB Inhale 1 puff into the lungs daily. Patient not taking: Reported on 05/03/2017 03/31/17   Arnoldo Morale, MD   Allergies  Allergen Reactions  . Cephalosporins Anaphylaxis  . Penicillins Anaphylaxis    Has patient had a PCN reaction causing immediate rash, facial/tongue/throat swelling, SOB or lightheadedness with hypotension: Yes Has patient had a PCN reaction causing severe rash involving mucus membranes or skin necrosis: No Has patient had a PCN reaction that required hospitalization No Has patient had a PCN reaction occurring within the last 10 years: No If all of the above answers are "NO", then may proceed with Cephalosporin use.   . Isovue [Iopamidol] Hives    Pt started sneezing post injection, itching, congestion, will need premeds prior to future scans with iv contrast  . Codeine Itching  . Erythromycin Hives  . Meloxicam Nausea And Vomiting  . Methocarbamol Nausea And Vomiting  . Tramadol Hives  . Vicodin [Hydrocodone-Acetaminophen] Hives  . Erythromycin Base Rash  . Hydrocodone-Acetaminophen Rash    FAMILY HISTORY:  family history includes Diabetes in her mother; Heart disease in her maternal uncle; Hypertension in her mother. SOCIAL HISTORY:  reports that she has been smoking Cigarettes.  She has a 6.75 pack-year  smoking history. She has never used smokeless tobacco. She reports that she does not drink alcohol or use drugs.  REVIEW OF SYSTEMS:   Constitutional: Negative for fever, chills, weight loss, malaise/fatigue and diaphoresis.  HENT: Negative for hearing loss, ear pain, nosebleeds, congestion, sore throat, neck pain, tinnitus and ear discharge.   Eyes: Negative for blurred vision, double vision, photophobia, pain, discharge and redness.  Respiratory: Negative for  hemoptysis, sputum production and stridor.   Cardiovascular: Negative for chest pain, palpitations, orthopnea, claudication, leg swelling and PND.  Gastrointestinal: Negative for heartburn, nausea, vomiting, abdominal pain, diarrhea, constipation, blood in stool and melena.  Genitourinary: Negative for dysuria, urgency, frequency, hematuria and flank pain.  Musculoskeletal: Negative for myalgias, back pain, joint pain and falls.  Skin: Negative for itching and rash.  Neurological: Negative for dizziness, tingling, tremors, sensory change, speech change, focal weakness, seizures, loss of consciousness, weakness and headaches.  Endo/Heme/Allergies: Negative for environmental allergies and polydipsia. Does not bruise/bleed easily.  SUBJECTIVE:   VITAL SIGNS: Temp:  [97.7 F (36.5 C)-98.2 F (36.8 C)] 97.7 F (36.5 C) (06/18 0408) Pulse Rate:  [72-98] 72 (06/18 0408) Resp:  [19-31] 20 (06/18 0408) BP: (127-158)/(70-103) 127/70 (06/18 0408) SpO2:  [94 %-100 %] 98 % (06/18 0844) Weight:  [152 lb (68.9 kg)] 152 lb (68.9 kg) (06/17 1627)  PHYSICAL EXAMINATION:  Gen. Pleasant, well-nourished, in no distress, normal affect ENT - no lesions, no post nasal drip Neck: No JVD, no thyromegaly, no carotid bruits Lungs: no use of accessory muscles, no dullness to percussion, clear without rales or rhonchi  Cardiovascular: Rhythm regular, heart sounds  normal, no murmurs or gallops, no peripheral edema Abdomen: soft and non-tender, no  hepatosplenomegaly, BS normal. Musculoskeletal: No deformities, no cyanosis or clubbing Neuro:  alert, non focal    Recent Labs Lab 05/03/17 0858 05/04/17 0722  NA 139 140  K 2.9* 4.1  CL 103 106  CO2 27 26  BUN 7 11  CREATININE 0.63 0.60  GLUCOSE 121* 128*    Recent Labs Lab 05/03/17 0858 05/04/17 0722  HGB 11.5* 11.3*  HCT 35.1* 35.2*  WBC 13.6* 23.3*  PLT 375 379   Dg Chest 2 View  Result Date: 05/03/2017 CLINICAL DATA:  Shortness of breath EXAM: CHEST  2 VIEW COMPARISON:  Chest x-rays February 10, 2017 and February 13, 2017. Chest CT February 10, 2017. FINDINGS: Bilateral patchy pulmonary infiltrates persist, mildly worsened since February 13, 2017. The heart size is borderline with no gross cardiomegaly. The hila and mediastinum are unchanged. No pneumothorax is identified. No other interval changes. IMPRESSION: Bilateral patchy pulmonary infiltrates are mildly worsened since February 13, 2017. Whether this represents chronic persistent infiltrates or recurrent infiltrates is unclear. Electronically Signed   By: Dorise Bullion III M.D   On: 05/03/2017 09:48   Ct Angio Chest Pe W/cm &/or Wo Cm  Result Date: 05/03/2017 CLINICAL DATA:  41 year old female with a history of shortness of breath and decreased oxygen saturation. Patient has a history of smoking EXAM: CT ANGIOGRAPHY CHEST WITH CONTRAST TECHNIQUE: Multidetector CT imaging of the chest was performed using the standard protocol during bolus administration of intravenous contrast. Multiplanar CT image reconstructions and MIPs were obtained to evaluate the vascular anatomy. CONTRAST:  100 cc Isovue 370 COMPARISON:  CT 02/10/2017 11/20/2013 FINDINGS: Cardiovascular: Heart: No cardiomegaly. No pericardial fluid/thickening. No significant coronary calcifications. Aorta: Unremarkable course, caliber, contour of the thoracic aorta. No aneurysm or dissection flap. No periaortic fluid. Pulmonary arteries: No central, lobar, segmental, or  proximal subsegmental filling defects. Mediastinum/Nodes: Mediastinal lymph nodes are present. None of these are significantly enlarged, with the overall size and number similar to the comparison CT studies. Unremarkable appearance of the thoracic inlet and thyroid. Lungs/Pleura: CT comparison 2015 and 02/10/2017. There is a similar distribution and appearance of ground-glass opacities of the bilateral upper lobes, right middle lobe, and bilateral lower lobes. No subpleural reticulation. No interlobular septal thickening. No pleural effusions. No significant bronchial wall thickening. Upper Abdomen: Unremarkable. Musculoskeletal: No displaced fracture. Degenerative changes of the spine. Review of the MIP images confirms the above findings. IMPRESSION: The current CT demonstrates mixed ground-glass opacities very similar in distribution and appearance to the CT of March 2018 and January 2015, with relative stability over time. The appearance is favored to be less likely related to acute bronchopneumonia, and more likely chronic pneumonitis/interstitial pneumonia. In a patient of this age with a history of smoking, the spectrum of respiratory bronchiolitis (RB)/respiratory bronchiolitis interstitial lung disease (RB ILD) should be considered. Given that the appearance is not significantly worsened over time, findings are less likely related to desquamative interstitial pneumonia, which is on the severe spectrum of RB ILD. Alternative diagnosis such as hypersensitivity pneumonitis ("bird fancier's pneumonia") also considered, and correlation with environmental exposures may be useful. Electronically Signed   By: Corrie Mckusick D.O.  On: 05/03/2017 13:10    ASSESSMENT / PLAN:  Acute respiratory failure with hypoxia Differential diagnosis of bilateral groundglass opacities in this 41 year old smoker is broad and includes RB ILD and other forms of interstitial pneumonitis such as hypersensitivity pneumonitis.  Cryptogenic organizing pneumonia or COP and less likely, eosinophilic pneumonia also exists in the differential.  Recommend- Clearly smoking cessation is paramount and emphasized this to her Continue prednisone 40 mg twice a day I discussed various forms of biopsy- transrectal biopsy is low yield and I note that prior problems she had with sedation due to her chronic pain issues. I discussed the morbidity of open lung biopsy-she would be agreeable to proceed with this during this hospitalization I will discuss with CT surgery if this would be feasible during this admission  Kara Mead MD. [facility]. [facility] Pager [phone] If no response call [phone]     05/04/2017, 11:53 AM

## 2017-05-04 NOTE — Progress Notes (Signed)
PROGRESS NOTE  Christine Campbell QZE:[id number] DOB: December 03, 1975 DOA: 05/03/2017 PCP: Arnoldo Morale, MD  HPI/Recap of past 24 hours:  Dry cough, able to take in full sentences, report chest pain and chronic back pain No fever, no edema  Assessment/Plan: Principal Problem:   Multifocal pneumonia Active Problems:   Interstitial lung disease (HCC)   Seizure disorder (HCC)   COPD (chronic obstructive pulmonary disease) with chronic bronchitis (HCC)   Chronic pain   Tobacco abuse   Hypokalemia   Acute respiratory failure with hypoxia (HCC)   Assessment: This is a 41 year old Caucasian female with a past medical history as stated earlier, who resents with shortness of breath, cough, chest pain. She is found to have multifocal opacities on her CT scan bilaterally. Differential diagnoses include infectious process, inflammatory process, hypersensitivity pneumonitis, interstitial lung disease.  Plan:   Bilateral lung opacities/multifocal pneumonia/acute hypoxic respiratory failure:  --differential diagnosis is broad. She had a similar presentation in 2015 and at that time was seen by pulmonology. A bronchoscopy was attempted. However, she could not be adequately sedated, so it could not be completed. She underwent autoimmune workup at that time which was unremarkable. She underwent echocardiogram which showed normal systolic function. She does have mildly elevated WBC. She says that she has been around some pets of her neighbors. Otherwise, she denies exposure to any other allergens or new chemicals recently.  --she was in the hospital in April for penumonia. She is started on vanc and aztreonam for hospital acquired pna, mrsa screen negative, respiratory viral panel negative, repeat autoimmune work up, continue aztreonam, consider atypical coverage with levaquin , check urine strep, urine legionella antigen -continue  steroids.  Nebulizer treatments as needed. Add mucinex, tessalon prn -consider  trial of lasix - pulmonology consulted   chest pain likely related to lung process. EKG does not show any ischemic changes. Troponin was normal. Will add pepcid to cover the possible GERD in the setting of steroids use.  history of COPD: Continue with her home medications. Currently, she is not wheezing.   history of seizure disorder: Continue with Keppra.   chronic pain syndrome with chronic back, neck, hip pains. She is noted to be on gabapentin, which will be continued. She tells me that she is in the process of being referred to a pain management clinic. Continue with home medications. She has been noted to have a drug-seeking behavior in the past. Please see last discharge summary from 02/2017  Tobacco abuse: She unfortunately continues to smoke, however, tells me that she has cut down. Nicotine patch.  Hypokalemia: repleced, mag 2.   DVT Prophylaxis: Lovenox Code Status: Full code Family Communication: Discussed with the patient  Consults called: Pulmonology   Procedures:  CTA chest   Antibiotics:  vanc from admission to 6/18  Aztreonam ( has multiple abx allergies including cepholasporins, pcn, erythromycin)   Objective: BP 127/70 (BP Location: Left Arm)   Pulse 72   Temp 97.7 F (36.5 C) (Oral)   Resp 20   Ht 5\' 1"  (1.549 m)   Wt 68.9 kg (152 lb)   SpO2 99%   BMI 28.72 kg/m   Intake/Output Summary (Last 24 hours) at 05/04/17 0809 Last data filed at 05/04/17 0703  Gross per 24 hour  Intake          1702.33 ml  Output                0 ml  Net  1702.33 ml   Filed Weights   05/03/17 0856 05/03/17 1627  Weight: 68.9 kg (152 lb) 68.9 kg (152 lb)    Exam:   General:  NAD  Cardiovascular: RRR  Respiratory: CTABL  Abdomen: Soft/ND/NT, positive BS  Musculoskeletal: No Edema  Neuro: aaox3  Data Reviewed: Basic Metabolic Panel:  Recent Labs Lab 05/03/17 0858  NA 139  K 2.9*  CL 103  CO2 27  GLUCOSE 121*  BUN 7  CREATININE  0.63  CALCIUM 8.6*   Liver Function Tests:  Recent Labs Lab 05/03/17 0858  AST 28  ALT 17  ALKPHOS 86  BILITOT 0.3  PROT 6.7  ALBUMIN 3.4*   No results for input(s): LIPASE, AMYLASE in the last 168 hours. No results for input(s): AMMONIA in the last 168 hours. CBC:  Recent Labs Lab 05/03/17 0858 05/04/17 0722  WBC 13.6* 23.3*  HGB 11.5* 11.3*  HCT 35.1* 35.2*  MCV 94.9 94.9  PLT 375 379   Cardiac Enzymes:    Recent Labs Lab 05/03/17 0858  TROPONINI <0.03   BNP (last 3 results)  Recent Labs  02/14/17 0226  BNP 67.6    ProBNP (last 3 results) No results for input(s): PROBNP in the last 8760 hours.  CBG: No results for input(s): GLUCAP in the last 168 hours.  No results found for this or any previous visit (from the past 240 hour(s)).   Studies: Dg Chest 2 View  Result Date: 05/03/2017 CLINICAL DATA:  Shortness of breath EXAM: CHEST  2 VIEW COMPARISON:  Chest x-rays February 10, 2017 and February 13, 2017. Chest CT February 10, 2017. FINDINGS: Bilateral patchy pulmonary infiltrates persist, mildly worsened since February 13, 2017. The heart size is borderline with no gross cardiomegaly. The hila and mediastinum are unchanged. No pneumothorax is identified. No other interval changes. IMPRESSION: Bilateral patchy pulmonary infiltrates are mildly worsened since February 13, 2017. Whether this represents chronic persistent infiltrates or recurrent infiltrates is unclear. Electronically Signed   By: Dorise Bullion III M.D   On: 05/03/2017 09:48   Ct Angio Chest Pe W/cm &/or Wo Cm  Result Date: 05/03/2017 CLINICAL DATA:  41 year old female with a history of shortness of breath and decreased oxygen saturation. Patient has a history of smoking EXAM: CT ANGIOGRAPHY CHEST WITH CONTRAST TECHNIQUE: Multidetector CT imaging of the chest was performed using the standard protocol during bolus administration of intravenous contrast. Multiplanar CT image reconstructions and MIPs were  obtained to evaluate the vascular anatomy. CONTRAST:  100 cc Isovue 370 COMPARISON:  CT 02/10/2017 11/20/2013 FINDINGS: Cardiovascular: Heart: No cardiomegaly. No pericardial fluid/thickening. No significant coronary calcifications. Aorta: Unremarkable course, caliber, contour of the thoracic aorta. No aneurysm or dissection flap. No periaortic fluid. Pulmonary arteries: No central, lobar, segmental, or proximal subsegmental filling defects. Mediastinum/Nodes: Mediastinal lymph nodes are present. None of these are significantly enlarged, with the overall size and number similar to the comparison CT studies. Unremarkable appearance of the thoracic inlet and thyroid. Lungs/Pleura: CT comparison 2015 and 02/10/2017. There is a similar distribution and appearance of ground-glass opacities of the bilateral upper lobes, right middle lobe, and bilateral lower lobes. No subpleural reticulation. No interlobular septal thickening. No pleural effusions. No significant bronchial wall thickening. Upper Abdomen: Unremarkable. Musculoskeletal: No displaced fracture. Degenerative changes of the spine. Review of the MIP images confirms the above findings. IMPRESSION: The current CT demonstrates mixed ground-glass opacities very similar in distribution and appearance to the CT of March 2018 and January 2015, with relative stability  over time. The appearance is favored to be less likely related to acute bronchopneumonia, and more likely chronic pneumonitis/interstitial pneumonia. In a patient of this age with a history of smoking, the spectrum of respiratory bronchiolitis (RB)/respiratory bronchiolitis interstitial lung disease (RB ILD) should be considered. Given that the appearance is not significantly worsened over time, findings are less likely related to desquamative interstitial pneumonia, which is on the severe spectrum of RB ILD. Alternative diagnosis such as hypersensitivity pneumonitis ("bird fancier's pneumonia") also  considered, and correlation with environmental exposures may be useful. Electronically Signed   By: Corrie Mckusick D.O.   On: 05/03/2017 13:10    Scheduled Meds: . atorvastatin  20 mg Oral Daily  . busPIRone  7.5 mg Oral BID  . enoxaparin (LOVENOX) injection  40 mg Subcutaneous Q24H  . FLUoxetine  40 mg Oral Daily  . fluticasone furoate-vilanterol  1 puff Inhalation Daily  . gabapentin  600 mg Oral BID  . levETIRAcetam  500 mg Oral BID  . multivitamin with minerals  1 tablet Oral Daily  . nicotine  14 mg Transdermal Daily  . predniSONE  40 mg Oral BID WC  . sodium chloride flush  3 mL Intravenous Q12H    Continuous Infusions: . sodium chloride 250 mL (05/03/17 1749)  . aztreonam    . vancomycin Stopped (05/04/17 0417)     Time spent: 88mins  Reanna Scoggin MD, PhD  [facility] Pager [phone]. If 7PM-7AM, please contact night-coverage at [email or web address], password [email or web address] 05/04/2017, 8:09 AM  LOS: 1 day

## 2017-05-05 DIAGNOSIS — J849 Interstitial pulmonary disease, unspecified: Secondary | ICD-10-CM

## 2017-05-05 LAB — CBC WITH DIFFERENTIAL/PLATELET
BASOS PCT: 0 %
Basophils Absolute: 0 10*3/uL (ref 0.0–0.1)
Eosinophils Absolute: 0 10*3/uL (ref 0.0–0.7)
Eosinophils Relative: 0 %
HEMATOCRIT: 37 % (ref 36.0–46.0)
Hemoglobin: 12 g/dL (ref 12.0–15.0)
LYMPHS PCT: 17 %
Lymphs Abs: 3.7 10*3/uL (ref 0.7–4.0)
MCH: 30.2 pg (ref 26.0–34.0)
MCHC: 32.4 g/dL (ref 30.0–36.0)
MCV: 93.2 fL (ref 78.0–100.0)
MONO ABS: 1 10*3/uL (ref 0.1–1.0)
MONOS PCT: 5 %
NEUTROS ABS: 17.4 10*3/uL — AB (ref 1.7–7.7)
Neutrophils Relative %: 78 %
Platelets: 442 10*3/uL — ABNORMAL HIGH (ref 150–400)
RBC: 3.97 MIL/uL (ref 3.87–5.11)
RDW: 14.8 % (ref 11.5–15.5)
WBC: 22.1 10*3/uL — ABNORMAL HIGH (ref 4.0–10.5)

## 2017-05-05 LAB — SEDIMENTATION RATE: SED RATE: 37 mm/h — AB (ref 0–22)

## 2017-05-05 LAB — BASIC METABOLIC PANEL
Anion gap: 10 (ref 5–15)
BUN: 16 mg/dL (ref 6–20)
CALCIUM: 9.3 mg/dL (ref 8.9–10.3)
CO2: 27 mmol/L (ref 22–32)
CREATININE: 0.63 mg/dL (ref 0.44–1.00)
Chloride: 102 mmol/L (ref 101–111)
GFR calc Af Amer: >60 mL/min (ref >60)
GFR calc non Af Amer: >60 mL/min (ref >60)
GLUCOSE: 151 mg/dL — AB (ref 65–99)
Potassium: 3.6 mmol/L (ref 3.5–5.1)
Sodium: 139 mmol/L (ref 135–145)

## 2017-05-05 LAB — C-REACTIVE PROTEIN: CRP: 2 mg/dL — ABNORMAL HIGH (ref <1.0)

## 2017-05-05 MED ORDER — GUAIFENESIN ER 600 MG PO TB12: 600.0000 mg | ORAL_TABLET | Freq: Two times a day (BID) | ORAL | 0 refills | Status: DC

## 2017-05-05 MED ORDER — OMEPRAZOLE 20 MG PO CPDR: 20.0000 mg | DELAYED_RELEASE_CAPSULE | Freq: Two times a day (BID) | ORAL | 0 refills | Status: AC

## 2017-05-05 MED ORDER — PREDNISONE 10 MG PO TABS: ORAL_TABLET | ORAL | 0 refills | Status: DC

## 2017-05-05 MED ORDER — BENZONATATE 100 MG PO CAPS: 100.0000 mg | ORAL_CAPSULE | Freq: Three times a day (TID) | ORAL | 0 refills | Status: DC | PRN

## 2017-05-05 NOTE — Discharge Summary (Signed)
Discharge Summary  Christine Campbell WPY:[id number] DOB: [birth year]  PCP: Christine Morale, MD  Admit date: 05/03/2017 Discharge date: 05/05/2017  Time spent: <89mins  Recommendations for Outpatient Follow-up:  1. F/u with PMD within a week  for hospital discharge follow up, repeat cbc/bmp at follow up 2. F/u with thoracic surgery Christine Campbell for possible open lung biopsy 3. F/u with pulmonology   Discharge Diagnoses:  Active Hospital Problems   Diagnosis Date Noted  . Multifocal pneumonia 05/03/2017  . Acute respiratory failure with hypoxia (Christine Campbell) 05/03/2017  . Hypokalemia 02/10/2017  . Tobacco abuse 02/10/2017  . Chronic pain 01/14/2017  . COPD (chronic obstructive pulmonary disease) with chronic bronchitis ([place]) 05/18/2014  . Interstitial lung disease ([place]) 11/21/2013  . Seizure disorder ([place]) 11/21/2013    Resolved Hospital Problems   Diagnosis Date Noted Date Resolved  No resolved problems to display.    Discharge Condition: stable  Diet recommendation: regular diet  Filed Weights   05/03/17 0856 05/03/17 1627  Weight: 68.9 kg (152 lb) 68.9 kg (152 lb)    History of present illness:  PCP: Christine Morale, MD  Specialists: Has seen Christine Campbell with pulmonology previously  Chief Complaint: Shortness of breath, chest pain, cough, ongoing for 3 days  HPI: Christine Campbell is a 41 y.o. female with a past medical history of COPD, chronic neck and back pain, multiple arthropathies, history of seizure disorder who was hospitalized in April with pneumonia. She was treated with antibiotics and was discharged home improved. She was saturating normal at the time of discharge. She presented to the emergency department today with complaints of shortness of breath, chest pain ongoing for 3 days. She's had a dry cough. She denies any fever. Some chills. No nausea, vomiting. Denies any leg swelling. No sick contacts. No recent travel. In the emergency department she was given oxygen and  nebulizer treatment, which which she felt better. She is not on home oxygen. She was however found to be saturating in the mid to early 80s in the emergency department on room air. She also underwent CT scan of her chest which revealed groundglass opacities with concern for pneumonitis.   Hospital Course:  Principal Problem:   Multifocal pneumonia Active Problems:   Interstitial lung disease (HCC)   Seizure disorder (HCC)   COPD (chronic obstructive pulmonary disease) with chronic bronchitis (HCC)   Chronic pain   Tobacco abuse   Hypokalemia   Acute respiratory failure with hypoxia (HCC)   Assessment:This is a 41 year old Caucasian female with a past medical history as stated earlier, who resents with shortness of breath, cough, chest pain. She is found to have multifocal opacities on her CT scan bilaterally. Differential diagnoses include infectious process, inflammatory process, hypersensitivity pneumonitis, interstitial lung disease.  Plan:   Bilateral lung opacities/multifocal pneumonia/acute hypoxic respiratory failure:  --differential diagnosis is broad. She had a similar presentation in 2015 and at that time was seen by pulmonology. A bronchoscopy was attempted. However, she could not be adequately sedated, so it could not be completed. She underwent autoimmune workup at that time which was unremarkable. She underwent echocardiogram which showed normal systolic function. She does have mildly elevated WBC. She says that she has been around some pets of her neighbors. Otherwise, she denies exposure to any other allergens or newchemicals recently.  --she was in the hospital in April for penumonia.  -She is started on vanc and aztreonam for hospital acquired pna during this admission, mrsa screen negative, respiratory viral panel negative, repeat autoimmune  work up result pending,  -continue  steroids.  Nebulizer treatments as needed. Add mucinex, tessalon prn -her symptom has much  improved, she is weaned off oxygen, she ambulated in hallway on room air, o2 sats >95% - pulmonology consulted, input appreciated, she is cleared to discharge home with steroids taper over two weeks, no need of abx per pulmonology. She is advised to follow with thoracic surgery for open lung biopsy.    chest pain likely related to lung process. EKG does not show any ischemic changes. Troponin was normal. Increase ppi to bid in the setting of steroids use.  history of COPD: Continue with her home medications. Currently, she is not wheezing.   history of seizure disorder: Continue with Keppra.   Chronic pain syndrome with chronic back, neck, hip pains.  She is noted to be on gabapentin, which will be continued.  She reports she is in the process of being referred to a pain management clinic.  She has been noted to have a drug-seeking behavior in the past. Please see last discharge summary from 02/2017   Tobacco abuse: She unfortunately continues to smoke, however, tells me that she has cut down. Nicotine patch. Smoking cessation education provided , steps to quit smoking education material provided.   Hypokalemia: repleced, mag 2.   DVT Prophylaxis while in the hospital:Lovenox Code Status:Full code Family Communication:Discussed with the patient Consults called: Pulmonology Pulmonology discussed the case with thoracic surgery   Procedures:  CTA chest   Antibiotics:  vanc from admission to 6/18  Aztreonam ( has multiple abx allergies including cepholasporins, pcn, erythromycin)   Discharge Exam: BP (!) 144/97 (BP Location: Left Arm) Comment: pt in pain, will recheck BP after pain medication has taken   Pulse (!) 59   Temp 97.9 F (36.6 C) (Oral)   Resp 18   Ht 5\' 1"  (1.549 m)   Wt 68.9 kg (152 lb)   SpO2 98%   BMI 28.72 kg/m   General: * Cardiovascular: * Respiratory: *  Discharge Instructions You were cared for by a hospitalist during your  hospital stay. If you have any questions about your discharge medications or the care you received while you were in the hospital after you are discharged, you can call the unit and asked to speak with the hospitalist on call if the hospitalist that took care of you is not available. Once you are discharged, your primary care physician will handle any further medical issues. Please note that NO REFILLS for any discharge medications will be authorized once you are discharged, as it is imperative that you return to your primary care physician (or establish a relationship with a primary care physician if you do not have one) for your aftercare needs so that they can reassess your need for medications and monitor your lab values.  Discharge Instructions    Diet general    Complete by:  As directed    Increase activity slowly    Complete by:  As directed      Allergies as of 05/05/2017      Reactions   Cephalosporins Anaphylaxis   Penicillins Anaphylaxis   Has patient had a PCN reaction causing immediate rash, facial/tongue/throat swelling, SOB or lightheadedness with hypotension: Yes Has patient had a PCN reaction causing severe rash involving mucus membranes or skin necrosis: No Has patient had a PCN reaction that required hospitalization No Has patient had a PCN reaction occurring within the last 10 years: No If all of the above  answers are "NO", then may proceed with Cephalosporin use.   Isovue [iopamidol] Hives   Pt started sneezing post injection, itching, congestion, will need premeds prior to future scans with iv contrast   Codeine Itching   Erythromycin Hives   Meloxicam Nausea And Vomiting   Methocarbamol Nausea And Vomiting   Tramadol Hives   Vicodin [hydrocodone-acetaminophen] Hives   Erythromycin Base Rash   Hydrocodone-acetaminophen Rash      Medication List    TAKE these medications   acetaminophen 500 MG tablet Commonly known as:  TYLENOL Take 1,000 mg by mouth every 6  (six) hours as needed (pain).   albuterol 108 (90 Base) MCG/ACT inhaler Commonly known as:  PROVENTIL HFA;VENTOLIN HFA Inhale 2 puffs into the lungs every 4 (four) hours as needed for wheezing or shortness of breath.   atorvastatin 20 MG tablet Commonly known as:  LIPITOR Take 1 tablet (20 mg total) by mouth daily.   benzonatate 100 MG capsule Commonly known as:  TESSALON Take 1 capsule (100 mg total) by mouth 3 (three) times daily as needed for cough.   busPIRone 7.5 MG tablet Commonly known as:  BUSPAR Take 1 tablet (7.5 mg total) by mouth 2 (two) times daily.   ergocalciferol 50000 units capsule Commonly known as:  DRISDOL Take 1 capsule (50,000 Units total) by mouth once a week.   FLUoxetine 20 MG tablet Commonly known as:  PROZAC Take 2 tablets (40 mg total) by mouth daily.   fluticasone furoate-vilanterol 100-25 MCG/INH Aepb Commonly known as:  BREO ELLIPTA Inhale 1 puff into the lungs daily.   gabapentin 300 MG capsule Commonly known as:  NEURONTIN Take 2 capsules (600 mg total) by mouth 2 (two) times daily.   guaiFENesin 600 MG 12 hr tablet Commonly known as:  MUCINEX Take 1 tablet (600 mg total) by mouth 2 (two) times daily.   ibuprofen 200 MG tablet Commonly known as:  ADVIL,MOTRIN Take 800 mg by mouth every 6 (six) hours as needed (pain).   levETIRAcetam 500 MG tablet Commonly known as:  KEPPRA Take 1 tablet (500 mg total) by mouth 2 (two) times daily.   omeprazole 20 MG capsule Commonly known as:  PRILOSEC Take 1 capsule (20 mg total) by mouth 2 (two) times daily before a meal. What changed:  when to take this   predniSONE 10 MG tablet Commonly known as:  DELTASONE Label  & dispense according to the schedule below. 4 Pills PO for 3 days then, 3 Pills PO for 3 days, 2 Pills PO for 3 days, 1 Pill PO for 3 days, 1/2 Pills PO for 3 days, then STOP.   tiZANidine 4 MG tablet Commonly known as:  ZANAFLEX Take 4 mg by mouth every 8 (eight) hours as needed  for muscle spasms.   umeclidinium bromide 62.5 MCG/INH Aepb Commonly known as:  INCRUSE ELLIPTA Inhale 1 puff into the lungs daily.      Allergies  Allergen Reactions  . Cephalosporins Anaphylaxis  . Penicillins Anaphylaxis    Has patient had a PCN reaction causing immediate rash, facial/tongue/throat swelling, SOB or lightheadedness with hypotension: Yes Has patient had a PCN reaction causing severe rash involving mucus membranes or skin necrosis: No Has patient had a PCN reaction that required hospitalization No Has patient had a PCN reaction occurring within the last 10 years: No If all of the above answers are "NO", then may proceed with Cephalosporin use.   . Isovue [Iopamidol] Hives    Pt started  sneezing post injection, itching, congestion, will need premeds prior to future scans with iv contrast  . Codeine Itching  . Erythromycin Hives  . Meloxicam Nausea And Vomiting  . Methocarbamol Nausea And Vomiting  . Tramadol Hives  . Vicodin [Hydrocodone-Acetaminophen] Hives  . Erythromycin Base Rash  . Hydrocodone-Acetaminophen Rash   Follow-up Information    [place]. Go on 05/06/2017.   Why:   appointment is at 10 am.  Please be on time.  If unable to keep appointment must call and reschedule. Contact information: [place] [address] [phone]       Grace Isaac, MD Follow up in 2 week(s).   Specialty:  Cardiothoracic Surgery Why:  for open lung biopsy Contact information: [address] [place] [address] [phone]            The results of significant diagnostics from this hospitalization (including imaging, microbiology, ancillary and laboratory) are listed below for reference.    Significant Diagnostic Studies: Dg Chest 2 View  Result Date: 05/03/2017 CLINICAL DATA:  Shortness of breath EXAM: CHEST  2 VIEW COMPARISON:  Chest x-rays February 10, 2017 and February 13, 2017. Chest CT February 10, 2017. FINDINGS: Bilateral patchy pulmonary infiltrates persist, mildly worsened since February 13, 2017. The heart size is borderline with no gross cardiomegaly. The hila and mediastinum are unchanged. No pneumothorax is identified. No other interval changes. IMPRESSION: Bilateral patchy pulmonary infiltrates are mildly worsened since February 13, 2017. Whether this represents chronic persistent infiltrates or recurrent infiltrates is unclear. Electronically Signed   By: Dorise Bullion III M.D   On: 05/03/2017 09:48   Ct Angio Chest Pe W/cm &/or Wo Cm  Result Date: 05/03/2017 CLINICAL DATA:  41 year old female with a history of shortness of breath and decreased oxygen saturation. Patient has a history of smoking EXAM: CT ANGIOGRAPHY CHEST WITH CONTRAST TECHNIQUE: Multidetector CT imaging of the chest was performed using the standard protocol during bolus administration of intravenous contrast. Multiplanar CT image reconstructions and MIPs were obtained to evaluate the vascular anatomy. CONTRAST:  100 cc Isovue 370 COMPARISON:  CT 02/10/2017 11/20/2013 FINDINGS: Cardiovascular: Heart: No cardiomegaly. No pericardial fluid/thickening. No significant coronary calcifications. Aorta: Unremarkable course, caliber, contour of the thoracic aorta. No aneurysm or dissection flap. No periaortic fluid. Pulmonary arteries: No central, lobar, segmental, or proximal subsegmental filling defects. Mediastinum/Nodes: Mediastinal lymph nodes are present. None of these are significantly enlarged, with the overall size and number similar to the comparison CT studies. Unremarkable appearance of the thoracic inlet and thyroid. Lungs/Pleura: CT comparison 2015 and 02/10/2017. There is a similar distribution and appearance of ground-glass opacities of the bilateral upper lobes, right middle lobe, and bilateral lower lobes. No subpleural reticulation. No interlobular septal thickening. No pleural effusions. No  significant bronchial wall thickening. Upper Abdomen: Unremarkable. Musculoskeletal: No displaced fracture. Degenerative changes of the spine. Review of the MIP images confirms the above findings. IMPRESSION: The current CT demonstrates mixed ground-glass opacities very similar in distribution and appearance to the CT of March 2018 and January 2015, with relative stability over time. The appearance is favored to be less likely related to acute bronchopneumonia, and more likely chronic pneumonitis/interstitial pneumonia. In a patient of this age with a history of smoking, the spectrum of respiratory bronchiolitis (RB)/respiratory bronchiolitis interstitial lung disease (RB ILD) should be considered. Given that the appearance is not significantly worsened over time, findings are less likely related to desquamative  interstitial pneumonia, which is on the severe spectrum of RB ILD. Alternative diagnosis such as hypersensitivity pneumonitis ("bird fancier's pneumonia") also considered, and correlation with environmental exposures may be useful. Electronically Signed   By: Corrie Mckusick D.O.   On: 05/03/2017 13:10    Microbiology: Recent Results (from the past 240 hour(s))  Blood culture (routine x 2)     Status: None (Preliminary result)   Collection Time: 05/03/17  9:40 AM  Result Value Ref Range Status   Specimen Description BLOOD LEFT HAND  Final   Special Requests   Final    BOTTLES DRAWN AEROBIC AND ANAEROBIC Blood Culture adequate volume   Culture   Final    NO GROWTH < 24 HOURS Performed at [facility], [address], [place], [place] [address]    Report Status PENDING  Incomplete  Blood culture (routine x 2)     Status: None (Preliminary result)   Collection Time: 05/03/17  9:57 AM  Result Value Ref Range Status   Specimen Description BLOOD RIGHT FOREARM  Final   Special Requests   Final    BOTTLES DRAWN AEROBIC AND ANAEROBIC Blood Culture adequate volume   Culture   Final    NO  GROWTH < 24 HOURS Performed at [facility], [place] [address], [place], [place] [address]    Report Status PENDING  Incomplete  Respiratory Panel by PCR     Status: None   Collection Time: 05/04/17 10:01 AM  Result Value Ref Range Status   Adenovirus NOT DETECTED NOT DETECTED Final   Coronavirus 229E NOT DETECTED NOT DETECTED Final   Coronavirus HKU1 NOT DETECTED NOT DETECTED Final   Coronavirus NL63 NOT DETECTED NOT DETECTED Final   Coronavirus OC43 NOT DETECTED NOT DETECTED Final   Metapneumovirus NOT DETECTED NOT DETECTED Final   Rhinovirus / Enterovirus NOT DETECTED NOT DETECTED Final   Influenza A NOT DETECTED NOT DETECTED Final   Influenza B NOT DETECTED NOT DETECTED Final   Parainfluenza Virus 1 NOT DETECTED NOT DETECTED Final   Parainfluenza Virus 2 NOT DETECTED NOT DETECTED Final   Parainfluenza Virus 3 NOT DETECTED NOT DETECTED Final   Parainfluenza Virus 4 NOT DETECTED NOT DETECTED Final   Respiratory Syncytial Virus NOT DETECTED NOT DETECTED Final   Bordetella pertussis NOT DETECTED NOT DETECTED Final   Chlamydophila pneumoniae NOT DETECTED NOT DETECTED Final   Mycoplasma pneumoniae NOT DETECTED NOT DETECTED Final    Comment: Performed at [facility], [place] [address], [place], [place] [address]  MRSA PCR Screening     Status: None   Collection Time: 05/04/17 10:01 AM  Result Value Ref Range Status   MRSA by PCR NEGATIVE NEGATIVE Final    Comment:        The GeneXpert MRSA Assay (FDA approved for NASAL specimens only), is one component of a comprehensive MRSA colonization surveillance program. It is not intended to diagnose MRSA infection nor to guide or monitor treatment for MRSA infections.      Labs: Basic Metabolic Panel:  Recent Labs Lab 05/03/17 0858 05/04/17 0722 05/05/17 0904  NA 139 140 139  K 2.9* 4.1 3.6  CL 103 106 102  CO2 27 26 27   GLUCOSE 121* 128* 151*  BUN 7 11 16   CREATININE 0.63 0.60 0.63  CALCIUM 8.6* 9.3 9.3  MG  --   2.0  --    Liver Function Tests:  Recent Labs Lab 05/03/17 0858 05/04/17 0722  AST 28  19  ALT 17 15  ALKPHOS 86 91  BILITOT 0.3 0.1*  PROT 6.7 7.0  ALBUMIN 3.4* 3.4*   No results for input(s): LIPASE, AMYLASE in the last 168 hours. No results for input(s): AMMONIA in the last 168 hours. CBC:  Recent Labs Lab 05/03/17 0858 05/04/17 0722 05/05/17 0904  WBC 13.6* 23.3* 22.1*  NEUTROABS  --   --  17.4*  HGB 11.5* 11.3* 12.0  HCT 35.1* 35.2* 37.0  MCV 94.9 94.9 93.2  PLT 375 379 442*   Cardiac Enzymes:  Recent Labs Lab 05/03/17 0858  TROPONINI <0.03   BNP: BNP (last 3 results)  Recent Labs  02/14/17 0226  BNP 67.6    ProBNP (last 3 results) No results for input(s): PROBNP in the last 8760 hours.  CBG: No results for input(s): GLUCAP in the last 168 hours.     SignedFlorencia Reasons MD, PhD  [facility] 05/05/2017, 11:04 AM

## 2017-05-05 NOTE — Progress Notes (Signed)
   Name: Christine Campbell MRN: [id number] DOB: August 25, 1976    ADMISSION DATE:  05/03/2017 CONSULTATION DATE:  05/05/2017  REFERRING MD :  Erlinda Hong, [facility]  CHIEF COMPLAINT:  Cough, dyspnea  HISTORY OF PRESENT ILLNESS:  41 year old smoker with chronic pain and bilateral groundglass/ interstitial lung disease dating back to 2015    STUDIES:  11/2013 -admitted  with pleuritic chest pain, dry cough & mild exertional dyspnea. CT chest -Bilateral scattered ground-glass opacities  concerning for COP Bronchoscopy - unable to perform due to sedation issues (after versed 88m and 1776m Fentanyl was fully awake) .  ANA,RA factor was negative. ESR  75. HIV and ACE level were negative.   Spirometry 04/2014 >> severe airway obstrucn , poor effort, FEV1 46%- 1.28, ratio 54, FVC 73%  CT angio chest 04/2017 -Groundglass opacities similar to 01/2017 and 11/2013-more likely chronic pneumonitis/interstitial pneumonia   SUBJECTIVE: Denies chest pain or dyspnea    VITAL SIGNS: Temp:  [97.4 F (36.3 C)-98.4 F (36.9 C)] 97.9 F (36.6 C) (06/19 0540) Pulse Rate:  [59-75] 59 (06/19 0540) Resp:  [18] 18 (06/19 0540) BP: (144-149)/(81-97) 144/97 (06/19 0540) SpO2:  [95 %-100 %] 98 % (06/19 1052)  PHYSICAL EXAMINATION: Gen. Pleasant, well-nourished, in no distress, normal affect ENT - no lesions, no post nasal drip Neck: No JVD, no thyromegaly, no carotid bruits Lungs: no use of accessory muscles, no dullness to percussion, clear without rales or rhonchi  Cardiovascular: Rhythm regular, heart sounds  normal, no murmurs or gallops, no peripheral edema Abdomen: soft and non-tender, no hepatosplenomegaly, BS normal. Musculoskeletal: No deformities, no cyanosis or clubbing Neuro:  alert, non focal    Recent Labs Lab 05/03/17 0858 05/04/17 0722 05/05/17 0904  NA 139 140 139  K 2.9* 4.1 3.6  CL 103 106 102  CO2 _0 BUN _1 CREATININE 0.63 0.60 0.63  GLUCOSE 121* 128* 151*    Recent Labs Lab  05/03/17 0858 05/04/17 0722 05/05/17 0904  HGB 11.5* 11.3* 12.0  HCT 35.1* 35.2* 37.0  WBC 13.6* 23.3* 22.1*  PLT 375 379 442*   CT chest was reviewed  ASSESSMENT / PLAN:  Acute respiratory failure with hypoxia Differential diagnosis of bilateral groundglass opacities in this [age]ear old smoker is broad and includes RB ILD and other forms of interstitial pneumonitis such as hypersensitivity pneumonitis. Cryptogenic organizing pneumonia or COP and less likely, eosinophilic pneumonia also exists in the differential.  Recommend- Clearly smoking cessation is paramount and emphasized this to her. She will try nicotine patches and if unable she should be provided with a prescription for Nicotrol inhaler. She will call the quit line for help   I discussed the need for surgical lung biopsy with her after she quit smoking. She will be provided with an appointment with [facility], I have spoken to Dr. GeServando Snareho will see her as outpatient .  She can be discharged on 40 mg prednisone with taper over the next 2 weeks. Outpatient follow-up with pulmonary will be in 4 weeks   RaKara MeadD. FCPasad[facility] LL[facility] Pager [phone] no response call [phone]     05/05/2017, 11:23 AM

## 2017-05-05 NOTE — Progress Notes (Signed)
Discharge instructions were gone over with patient. Prescriptions were given to patient. She is aware of her follow up appointments. IVsite was removed. Patient taken down by wheelchair.

## 2017-05-06 ENCOUNTER — Telehealth: Payer: Self-pay | Admitting: Pulmonary Disease

## 2017-05-06 ENCOUNTER — Encounter: Payer: Self-pay | Admitting: Critical Care Medicine

## 2017-05-06 ENCOUNTER — Ambulatory Visit: Payer: Self-pay | Attending: Critical Care Medicine | Admitting: Critical Care Medicine

## 2017-05-06 VITALS — BP 124/80 | HR 84 | Temp 98.4°F | Resp 14 | Ht 61.0 in | Wt 153.4 lb

## 2017-05-06 DIAGNOSIS — J849 Interstitial pulmonary disease, unspecified: Secondary | ICD-10-CM | POA: Insufficient documentation

## 2017-05-06 DIAGNOSIS — J449 Chronic obstructive pulmonary disease, unspecified: Secondary | ICD-10-CM | POA: Insufficient documentation

## 2017-05-06 DIAGNOSIS — Z23 Encounter for immunization: Secondary | ICD-10-CM

## 2017-05-06 DIAGNOSIS — Z720 Tobacco use: Secondary | ICD-10-CM

## 2017-05-06 DIAGNOSIS — G8929 Other chronic pain: Secondary | ICD-10-CM | POA: Insufficient documentation

## 2017-05-06 DIAGNOSIS — Z880 Allergy status to penicillin: Secondary | ICD-10-CM | POA: Insufficient documentation

## 2017-05-06 DIAGNOSIS — E7800 Pure hypercholesterolemia, unspecified: Secondary | ICD-10-CM | POA: Insufficient documentation

## 2017-05-06 DIAGNOSIS — M542 Cervicalgia: Secondary | ICD-10-CM | POA: Insufficient documentation

## 2017-05-06 DIAGNOSIS — J9601 Acute respiratory failure with hypoxia: Secondary | ICD-10-CM | POA: Insufficient documentation

## 2017-05-06 DIAGNOSIS — F419 Anxiety disorder, unspecified: Secondary | ICD-10-CM | POA: Insufficient documentation

## 2017-05-06 DIAGNOSIS — K219 Gastro-esophageal reflux disease without esophagitis: Secondary | ICD-10-CM | POA: Insufficient documentation

## 2017-05-06 DIAGNOSIS — F17210 Nicotine dependence, cigarettes, uncomplicated: Secondary | ICD-10-CM | POA: Insufficient documentation

## 2017-05-06 DIAGNOSIS — Z885 Allergy status to narcotic agent status: Secondary | ICD-10-CM | POA: Insufficient documentation

## 2017-05-06 DIAGNOSIS — G40909 Epilepsy, unspecified, not intractable, without status epilepticus: Secondary | ICD-10-CM | POA: Insufficient documentation

## 2017-05-06 DIAGNOSIS — J189 Pneumonia, unspecified organism: Secondary | ICD-10-CM | POA: Insufficient documentation

## 2017-05-06 DIAGNOSIS — F329 Major depressive disorder, single episode, unspecified: Secondary | ICD-10-CM | POA: Insufficient documentation

## 2017-05-06 MED ORDER — DM-GUAIFENESIN ER 30-600 MG PO TB12: 2.0000 | ORAL_TABLET | Freq: Two times a day (BID) | ORAL | 6 refills | Status: AC

## 2017-05-06 MED ORDER — NICOTINE 21 MG/24HR TD PT24: 21.0000 mg | MEDICATED_PATCH | Freq: Every day | TRANSDERMAL | 0 refills | Status: DC

## 2017-05-06 MED ORDER — NICOTINE 21 MG/24HR TD PT24
21.0000 mg | MEDICATED_PATCH | Freq: Every day | TRANSDERMAL | 0 refills | Status: DC
Start: 2017-05-06 — End: 2017-05-14

## 2017-05-06 MED FILL — ?PREDNISONE 10 MG TABLET: 10 | 15 days supply | Qty: 32 | Fill #0

## 2017-05-06 NOTE — Telephone Encounter (Signed)
I just saw her in the hospital & referred for surgical lung biopsy OK to make FU visit with me in 4 wks

## 2017-05-06 NOTE — Assessment & Plan Note (Signed)
Ongoing tobacco use Focus on smoking cessation with nicotine patch

## 2017-05-06 NOTE — Telephone Encounter (Signed)
Is it ok to double book her somewhere? You are double booked on quite a few days.

## 2017-05-06 NOTE — Assessment & Plan Note (Signed)
Stable copd with chronic bronchitis and ongoing smoking Plan Cont breo daily D/c incruse Prn SABA Cont prednisone , slow taper No oxygen needed Smoking cessation: obtain nicotine patch

## 2017-05-06 NOTE — Assessment & Plan Note (Signed)
Hypoxic respiratory failure resolved

## 2017-05-06 NOTE — Assessment & Plan Note (Signed)
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 RB ILD or COP Ct Chest reviewed with patient   Plan Referral to thoracic surgery for open lung bx, Dr Servando Snare, has appt 7/19 Focus on smoking cessation Ongoing pred pulse  Clean filters on HVAC in home
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
## 2017-05-06 NOTE — Patient Instructions (Signed)
Get prednisone filled Do not fill benzonatate Get Mucinex DM two twice daily. Sent to pharmacy Stop incruse and stay on Breo Keep appointment with Dr Servando Snare Get nicotine patch 21mg  daily and focus on smoking cessation Call dr Elsworth Soho office for follow up appointment

## 2017-05-06 NOTE — Telephone Encounter (Signed)
Spoke with pt. She is scheduled to see TP in July for a HFU. Pt is refusing to see TP and only wants to see RA. She has not been seen by our office since 2015. Pt demanded that I speak with RA about this matter.  RA - please advise. Thanks.

## 2017-05-07 NOTE — Telephone Encounter (Signed)
RA please advise if ok to double book you. Thanks

## 2017-05-08 LAB — CULTURE, BLOOD (ROUTINE X 2)
CULTURE: NO GROWTH
Culture: NO GROWTH
SPECIAL REQUESTS: ADEQUATE
Special Requests: ADEQUATE

## 2017-05-08 LAB — MPO/PR-3 (ANCA) ANTIBODIES

## 2017-05-11 LAB — HYPERSENSITIVITY PNEUMONITIS
A. FUMIGATUS #1 ABS: NEGATIVE
A. PULLULANS ABS: NEGATIVE
Micropolyspora faeni, IgG: NEGATIVE
Pigeon Serum Abs: NEGATIVE
THERMOACTINOMYCES VULGARIS IGG: NEGATIVE
Thermoact. Saccharii: NEGATIVE

## 2017-05-11 NOTE — Telephone Encounter (Signed)
Left message for patient to call back  

## 2017-05-11 NOTE — Telephone Encounter (Signed)
She was seen by dr Joya Gaskins , so appt with me can wait until after she is seen by dr gerhardt 7/19 Pl keep her on call list

## 2017-05-12 NOTE — Telephone Encounter (Signed)
lmtcb x2 for pt. 

## 2017-05-13 ENCOUNTER — Telehealth: Payer: Self-pay

## 2017-05-13 ENCOUNTER — Telehealth: Payer: Self-pay | Admitting: Physical Medicine & Rehabilitation

## 2017-05-13 ENCOUNTER — Telehealth: Payer: Self-pay | Admitting: Family Medicine

## 2017-05-13 ENCOUNTER — Ambulatory Visit (HOSPITAL_COMMUNITY)
Admission: EM | Admit: 2017-05-13 | Discharge: 2017-05-13 | Disposition: A | Discharge status: Home / Self Care | Payer: Self-pay | Attending: Family Medicine | Admitting: Family Medicine

## 2017-05-13 ENCOUNTER — Ambulatory Visit: Payer: Self-pay | Attending: Family Medicine

## 2017-05-13 ENCOUNTER — Encounter (HOSPITAL_COMMUNITY): Payer: Self-pay | Admitting: *Deleted

## 2017-05-13 DIAGNOSIS — M545 Low back pain: Secondary | ICD-10-CM

## 2017-05-13 DIAGNOSIS — G8929 Other chronic pain: Secondary | ICD-10-CM

## 2017-05-13 MED ORDER — OXYCODONE-ACETAMINOPHEN 5-325 MG PO TABS: 1.0000 | ORAL_TABLET | Freq: Four times a day (QID) | ORAL | 0 refills | Status: DC | PRN

## 2017-05-13 MED ORDER — TIZANIDINE HCL 4 MG PO TABS: 4.0000 mg | ORAL_TABLET | Freq: Three times a day (TID) | ORAL | 3 refills | Status: DC | PRN

## 2017-05-13 MED FILL — OXYCODONE W/APAP 5/325 TAB: 5-325 | 4 days supply | Qty: 15 | Fill #0
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
## 2017-05-13 NOTE — Telephone Encounter (Signed)
lmtcb X1 to make pt aware of nicotrol inhaler recs.  Need to verify pharmacy.

## 2017-05-13 NOTE — Telephone Encounter (Signed)
Phoned home to discuss situation as CMA believes her to be very distraught - I recommended [facility] she said they won't do anything.  I advised we have an Urgent Care that also accepts orange card if she is financially worried. She got address and phone number for Urgent care - she is in pain and teary.  But she states she is not suicidal- just hurting.  Not happy with our change of schedule but states it was not her fault she had to cancel the first two we scheduled -- states she is going to try to go to Urgent care - there is a female in the home that answered the phone.

## 2017-05-13 NOTE — Telephone Encounter (Signed)
Patient has called stating she was not going to reschdule her appointment that is  scheduled for 06/01/2017 with Dr.Swartz. I mentioned to patient that Dr. Naaman Plummer would be over at the hospital this day. I apologized for the inconvenience and reassured patient that she would be added to the wait-list and our office would give her call if/when an opening became available.  Patient states "she is in so much pain that she will blow her head off because the pain is unbearable". I asked patient does she have an active plan. Patient stated "No, she is in so much pain" I reiterated to the patient that she should go to the emergency room and or urgent care if the pain is too unbearable. Patient verbalized understanding. Patient also expressed concerns about finical responsibility. I informed patient that she has [place] 100% finical assistance.
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
## 2017-05-13 NOTE — Telephone Encounter (Signed)
Facility called and stated patient's appt. was rescheduled,they called wanting to know if we could fit patient today due to her pain symptoms.... Her pcp stated she has exhausted her resources patient was referred to pain clinic, she has seen patient and previously prescribed medications to assist with pain.

## 2017-05-13 NOTE — Telephone Encounter (Signed)
Ok to Rx nicotrol inhaler #5

## 2017-05-13 NOTE — Telephone Encounter (Signed)
Patient would like to request a prescription for pain..she is having to wait another month  8/22, before being seen by pain specialists...Marland KitchenMarland KitchenMarland Kitchen  Please advised

## 2017-05-13 NOTE — Telephone Encounter (Signed)
She does have refills at the pharmacy and just needs to call for refills. I have added tizanidine to her regimen.

## 2017-05-13 NOTE — Telephone Encounter (Signed)
Spoke with Asencion Partridge at [facility] and she will get okay from Dr. Jarold Song to [facility] patient and call patient.

## 2017-05-13 NOTE — Telephone Encounter (Signed)
Called and spoke with pt and she stated that her appt with Dr. Servando Snare has been moved to 7/2.    She stated that she was able to get the patches, but she now has a rash on her chest. Any further recs for this? She wanted to see if she would be able to get the nicotine inhaler.   RA please advise. thanks

## 2017-05-14 LAB — RHEUMATOID FACTOR: Rhuematoid fact SerPl-aCnc: <10 IU/mL (ref 0.0–13.9)

## 2017-05-14 LAB — IGE: IGE (IMMUNOGLOBULIN E), SERUM: 676 [IU]/mL — AB (ref 0–100)

## 2017-05-14 MED ORDER — NICOTINE 10 MG IN INHA: 1.0000 | RESPIRATORY_TRACT | 5 refills | Status: DC | PRN

## 2017-05-14 NOTE — Telephone Encounter (Signed)
Called and spoke with pt and she is aware of rx that has been sent to the pharmacy.  

## 2017-05-18 ENCOUNTER — Encounter: Payer: Self-pay | Admitting: Cardiothoracic Surgery

## 2017-05-18 LAB — ANTINUCLEAR ANTIBODIES, IFA: ANTINUCLEAR ANTIBODIES, IFA: NEGATIVE

## 2017-05-21 ENCOUNTER — Encounter: Payer: Self-pay | Admitting: Cardiothoracic Surgery

## 2017-05-22 ENCOUNTER — Other Ambulatory Visit: Payer: Self-pay

## 2017-05-22 ENCOUNTER — Institutional Professional Consult (permissible substitution) (INDEPENDENT_AMBULATORY_CARE_PROVIDER_SITE_OTHER): Payer: Self-pay | Admitting: Cardiothoracic Surgery

## 2017-05-22 ENCOUNTER — Encounter: Payer: Self-pay | Admitting: Cardiothoracic Surgery

## 2017-05-22 ENCOUNTER — Ambulatory Visit: Payer: Self-pay | Admitting: Family Medicine

## 2017-05-22 ENCOUNTER — Other Ambulatory Visit: Payer: Self-pay | Admitting: *Deleted

## 2017-05-22 VITALS — BP 106/75 | HR 71 | Resp 20 | Ht 61.0 in | Wt 152.0 lb

## 2017-05-22 DIAGNOSIS — R918 Other nonspecific abnormal finding of lung field: Secondary | ICD-10-CM

## 2017-05-22 DIAGNOSIS — J849 Interstitial pulmonary disease, unspecified: Secondary | ICD-10-CM
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
## 2017-05-22 NOTE — Patient Instructions (Signed)
Video-Assisted Thoracic Surgery Video-assisted thoracic surgery (VATS) is a procedure that allows your surgeon to look inside your chest and perform minor procedures as needed. The thoracic area is between the neck and abdomen. VATS is commonly done to:  Study or diagnose problems in the chest.  Remove a tissue sample (biopsy) to be examined.  Put medicines directly into the chest.  Remove collections of fluid, pus, or blood.  Remove tumors.  Remove a part of a lung (lobectomy).  Treat some problems with the spine, including: ? Abnormal curves (scoliosis or kyphosis). ? Breaks (fractures). ? Tumors.  VATS is done using thoracoscopy. Thoracoscopy is a procedure in which a thin tube with a light and camera on the end (thoracoscope) is inserted through a small incision in the chest wall. The thoracoscope sends images to a video monitor that your surgeon will use to view the inside of your chest. Tell a health care provider about:  Any allergies you have.  All medicines you are taking, including vitamins, herbs, eye drops, creams, and over-the-counter medicines.  Any problems you or family members have had with anesthetic medicines.  Any surgeries you have had.  Any medical conditions you have.  Any blood disorders you have.  Whether you are pregnant or may be pregnant. What are the risks? Generally, this is a safe procedure. However, problems may occur, including:  Infection.  Severe bleeding (hemorrhage).  Allergic reaction to medicines.  Damage to structures or organs in the chest, such as nerves.  Lung infection (pneumonia).  Inability to complete the procedure. If this is the case, your chest may need to be opened with a large incision (thoracotomy).  What happens before the procedure? Medicines  Ask your health care provider about: ? Changing or stopping your regular medicines. This is especially important if you are taking diabetes medicines or blood  thinners. ? Taking medicines such as aspirin and ibuprofen. These medicines can thin your blood. Do not take these medicines before your procedure if your health care provider instructs you not to.  You may be given antibiotic medicine to help prevent infection. Staying hydrated Follow instructions from your health care provider about hydration, which may include:  Up to 2 hours before the procedure - you may continue to drink clear liquids, such as water, clear fruit juice, black coffee, and plain tea.  Eating and drinking restrictions Follow instructions from your health care provider about eating and drinking, which may include:  8 hours before the procedure - stop eating heavy meals or foods such as meat, fried foods, or fatty foods.  6 hours before the procedure - stop eating light meals or foods, such as toast or cereal.  6 hours before the procedure - stop drinking milk or drinks that contain milk.  2 hours before the procedure - stop drinking clear liquids.  General instructions  Ask your health care provider how your surgical site will be marked or identified.  You may be asked to shower with a germ-killing soap.  You may have a blood or urine sample taken.  You may have imaging tests, such as: ? Chest X-ray. ? Electrocardiogram (ECG). ? Ultrasound. ? CT scan.  Do not use any products that contain nicotine or tobacco for as long as possible before your procedure. These include cigarettes and e-cigarettes. If you need help quitting, ask your health care provider.  Plan to have someone take you home from the hospital or clinic.  If you will be going home right  after the procedure, plan to have someone with you for 24 hours. What happens during the procedure?  To lower your risk of infection: ? Your health care team will wash or sanitize their hands. ? Your skin will be washed with soap.  An IV tube will be inserted into one of your veins.  You will be given one  or more of the following: ? A medicine to make you fall asleep (general anesthetic). ? A medicine to help you relax (sedative). ? A medicine that is injected into an area of your body to numb everything below the injection site (regional anesthetic). This is less common for this procedure. It may be given if you are not able to tolerate general anesthetic.  A thin tube (catheter) will be inserted into your bladder and your tube that carries urine out of your body (urethra). The catheter will drain your urine.  1-4 small incisions will be made in your chest. The number of incisions depends on the purpose of your procedure.  The thoracoscope will be inserted into your incision(s) and used to view the inside of your chest.  One of your lungs will be deflated. This makes it easier for your surgeon to see the area.  Other surgical instruments may be inserted through your incision(s) to perform necessary procedures.  After any procedures are done, your lung will be inflated.  A chest tube may be inserted through an incision to drain excess fluid from the surgical area.  Any remaining incisions will be closed with stitches (sutures) or staples.  A bandage (dressing) may be placed over your incision(s). The procedure may vary among health care providers and hospitals. What happens after the procedure?  Your blood pressure, heart rate, breathing rate, and blood oxygen level will be monitored until the medicines you were given have worn off.  You may have a chest tube draining fluid from the surgical area. The chest tube will be closely monitored for signs of fluid or air buildup in your lungs.  You may continue to receive fluids and medicines through an IV tube.  You may have to wear compression stockings. These stockings help to prevent blood clots and reduce swelling in your legs.  Do not drive for 24 hours if you were given a sedative. Summary  Video-assisted thoracic surgery (VATS) is  a procedure that allows your surgeon to look inside your chest to study or diagnose problems in your thoracic area.  VATS is done by inserting a thin tube with a light and camera on the end (thoracoscope) through a small incision in the chest wall.  After the procedure, you may have a chest tube draining fluid from the surgical area. The chest tube will be closely monitored for signs of fluid or air buildup in your lungs. This information is not intended to replace advice given to you by your health care provider. Make sure you discuss any questions you have with your health care provider. Document Released: 02/28/2013 Document Revised: 10/20/2016 Document Reviewed: 10/20/2016 Elsevier Interactive Patient Education  2017 [facility], Care After This sheet gives you information about how to care for yourself after your procedure. Your doctor may also give you more specific instructions. If you have problems or questions, contact your doctor. What can I expect after the procedure? After the procedure, it is common to have:  Some pain and soreness in your chest.  Pain when you breathe in (inhale) and cough.  Trouble pooping (  constipation).  Tiredness (fatigue).  Trouble sleeping.  Follow these instructions at home: Preventing lung infection ( pneumonia)  Take deep breaths or do breathing exercises as told by your doctor.  Cough often. Coughing is important to clear thick spit (phlegm) and open your lungs.  You can make coughing hurt less if you try supporting (splinting) your chest. Try one of these when you cough: ? Hold a pillow against your chest. ? Place both hands flat on top of your cut.  Use an incentive spirometer as told by your doctor. This is a tool that measures how well you can fill your lungs with each breath.  Do lung therapy (pulmonary rehabilitation) as told. Medicines  Take over-the-counter or prescription medicines only as  told by your doctor.  If you have pain, take pain-relieving medicine before your pain gets very bad. Doing this will help you breathe and cough more comfortably.  If you were prescribed an antibiotic medicine, take it as told by your doctor. Do not stop taking the antibiotic even if you start to feel better. Activity  Ask your doctor what activities are safe for you.  Avoid activities that use your chest muscles for 3-4 weeks or longer.  Do not lift anything that is heavier than 10 lb (4.5 kg), or the limit that your doctor tells you, until he or she says that it is safe. Cut ( incision) care  Follow instructions from your doctor about how to take care of your cut(s) from surgery. Make sure you: ? Wash your hands with soap and water before you change your bandage (dressing). If you cannot use soap and water, use hand sanitizer. ? Change your bandage as told by your doctor. ? Leave stitches (sutures), skin glue, or skin tape (adhesive) strips in place. They may need to stay in place for 2 weeks or longer. If tape strips get loose and curl up, you may trim the loose edges. Do not remove tape strips completely unless your doctor says it is okay.  Keep your bandage dry until it has been removed.  Every day, check the area around your cut(s) for signs of infection. Check for: ? Redness, swelling, or pain. ? Fluid or blood. ? Warmth. ? Pus or a bad smell. Bathing  Do not take baths, swim, or use a hot tub until your doctor approves. You may take showers.  After your bandage is removed, use soap and water to gently wash the your cut(s) from surgery. Do not use anything else to clean your cut(s) unless your doctor tells you to do this. Driving  Do not drive until your doctor approves.  Do not drive or use heavy machinery while taking prescription pain medicine. Eating and drinking  Eat a healthy diet as told by your doctor. A healthy diet includes: ? Lots of fresh fruits and  vegetables. ? Whole grains. ? Low-fat (lean) proteins.  Limit foods that are high in fat and processed sugars. These include fried and sweet foods.  Drink enough fluid to keep your pee (urine) clear or light yellow. General instructions  To prevent or treat trouble pooping while you are taking prescription pain medicine, your doctor may recommend that you: ? Take over-the-counter or prescription medicines. ? Eat foods that have a lot of fiber. These include beans, fresh fruits and vegetables, and whole grains.  Do not use any products that contain nicotine or tobacco. These include cigarettes and e-cigarettes. If you need help quitting, ask your doctor.  Avoid  being where people are smoking (avoid secondhand smoke).  Wear compression stockings as told by your doctor. These stockings help you: ? Not get blood clots in your legs. ? Have less swelling in your legs.  If you have a chest tube, care for it as told by your doctor.  Do not travel by airplane during the 2 weeks after your chest tube is removed, or until your doctor says that this is safe.  Keep all follow-up visits as told by your doctor. This is important. Contact a doctor if:  You have redness, swelling, or pain around a cut from surgery.  You have fluid or blood coming from a cut from surgery.  Your cut(s) from surgery feel warm to the touch.  You have pus or a bad smell coming from a cut from surgery.  You have a fever or chills.  You feel sick to your stomach (nauseous).  You throw up (vomit).  You have pain that does not get better with medicine. Get help right away if:  You have chest pain.  Your heart is fluttering or beating fast.  You start to have a rash.  You have shortness of breath.  You have trouble breathing.  You are confused.  You have trouble talking or understanding.  You feel weak, light-headed, or dizzy.  You faint. Summary  To help prevent lung infection (pneumonia), take  deep breaths or do breathing exercises as told by your doctor.  Cough often. This is important for clearing chest fluid (phlegm). You can make coughing hurt less if you hold a pillow to your chest or put your hands flat on the cut(s) when you cough (do splinting).  Do not drive until your doctor approves.  Every day, check your cut(s) for signs of infection. These signs can be redness, swelling, pain, fluid, blood, warmth, pus, or a bad smell.  Eat a healthy diet. This includes lots of fresh fruits and vegetables, whole grains, and low-fat (lean) proteins. This information is not intended to replace advice given to you by your health care provider. Make sure you discuss any questions you have with your health care provider. Document Released: 02/28/2013 Document Revised: 10/13/2016 Document Reviewed: 10/13/2016 Elsevier Interactive Patient Education  2017 [employer].

## 2017-05-26 MED FILL — GABAPENTIN 300 MG CAPSULE: 300 | 30 days supply | Qty: 120 | Fill #1

## 2017-05-28 NOTE — Pre-Procedure Instructions (Signed)
Christine Campbell  05/28/2017      [facility] [phone] - [place], [place] - [address] [place] [address] Phone: [phone] Fax: [place], [place] [address] [place] [address] Phone: [phone] Fax: [phone]  [place] [address] [place], [place] [address] Phone: [phone] Fax: [phone]  [place], [place] - [address] [facility] [address] [place] [address] Phone: [phone] Fax: [phone]  [facility] [place], [place] [address] Phone: [phone] Fax: [phone]    Your procedure is scheduled on June 02, 2017.  Report to [facility] Admitting at 530 AM.  Call this number if you have problems the morning of surgery:  [phone]   Remember:  Do not eat food or drink liquids after midnight.  Take these medicines the morning of surgery with A SIP OF WATER acetaminophen (tylenol)-if needed, albuterol (proventil) inhaler, fluticasone inhaler <bring inhalers with you>, fluoxetine (prozac), gabapentin (neurontin), levetiracetam (keppra), omeprazole (prilosec), oxycodone-acetaminophen (percocet)-if needed, tizanidine (zanaflex)-if needed.  7 days prior to surgery STOP taking any Aspirin, Aleve, Naproxen, Ibuprofen, Motrin, Advil, Goody's, BC's, all herbal medications, fish oil, and all vitamins   Do not wear jewelry, make-up or nail polish.  Do not wear lotions, powders, or perfumes, or deoderant.  Do not shave 48 hours prior to surgery.    Do not bring valuables to the hospital.  [facility] is not responsible for any belongings or valuables.  Contacts, dentures or bridgework may not be worn  into surgery.  Leave your suitcase in the car.  After surgery it may be brought to your room.  For patients admitted to the hospital, discharge time will be determined by your treatment team.  Patients discharged the day of surgery will not be allowed to drive home.    Special instructions:   [facility]- Preparing For Surgery  Before surgery, you can play an important role. Because skin is not sterile, your skin needs to be as free of germs as possible. You can reduce the number of germs on your skin by washing with CHG (chlorahexidine gluconate) Soap before surgery.  CHG is an antiseptic cleaner which kills germs and bonds with the skin to continue killing germs even after washing.  Please do not use if you have an allergy to CHG or antibacterial soaps. If your skin becomes reddened/irritated stop using the CHG.  Do not shave (including legs and underarms) for at least 48 hours prior to first CHG shower. It is OK to shave your face.  Please follow these instructions carefully.   1. Shower the NIGHT BEFORE SURGERY and the MORNING OF SURGERY with CHG.   2. If you chose to wash your hair, wash your hair first as usual with your normal shampoo.  3. After you shampoo, rinse your hair and body thoroughly to remove the shampoo.  4. Use CHG as you would any other liquid soap. You can apply CHG directly to the skin and wash gently with a scrungie or a clean washcloth.   5. Apply the CHG Soap to your body ONLY FROM THE NECK DOWN.  Do not use  on open wounds or open sores. Avoid contact with your eyes, ears, mouth and genitals (private parts). Wash genitals (private parts) with your normal soap.  6. Wash thoroughly, paying special attention to the area where your surgery will be performed.  7. Thoroughly rinse your body with warm water from the neck down.  8. DO NOT shower/wash with your normal soap after using and rinsing off the CHG Soap.  9. Pat yourself dry with a CLEAN TOWEL.   10. Wear  CLEAN PAJAMAS   11. Place CLEAN SHEETS on your bed the night of your first shower and DO NOT SLEEP WITH PETS.    Day of Surgery: Do not apply any deodorants/lotions. Please wear clean clothes to the hospital/surgery center.     Please read over the following fact sheets that you were given. Pain Booklet, Coughing and Deep Breathing, MRSA Information and Surgical Site Infection Prevention

## 2017-05-29 ENCOUNTER — Encounter (HOSPITAL_COMMUNITY)
Admission: RE | Admit: 2017-05-29 | Discharge: 2017-05-29 | Disposition: A | Discharge status: Home / Self Care | Payer: Self-pay | Source: Ambulatory Visit | Attending: Cardiothoracic Surgery | Admitting: Cardiothoracic Surgery

## 2017-05-29 ENCOUNTER — Ambulatory Visit (HOSPITAL_COMMUNITY)
Admission: RE | Admit: 2017-05-29 | Discharge: 2017-05-29 | Disposition: A | Discharge status: Home / Self Care | Payer: Self-pay | Source: Ambulatory Visit | Attending: Cardiothoracic Surgery | Admitting: Cardiothoracic Surgery

## 2017-05-29 ENCOUNTER — Encounter (HOSPITAL_COMMUNITY): Payer: Self-pay

## 2017-05-29 DIAGNOSIS — J849 Interstitial pulmonary disease, unspecified: Secondary | ICD-10-CM | POA: Insufficient documentation

## 2017-05-29 DIAGNOSIS — R918 Other nonspecific abnormal finding of lung field: Secondary | ICD-10-CM | POA: Insufficient documentation

## 2017-05-29 DIAGNOSIS — Z01812 Encounter for preprocedural laboratory examination: Secondary | ICD-10-CM | POA: Insufficient documentation

## 2017-05-29 DIAGNOSIS — Z01818 Encounter for other preprocedural examination: Secondary | ICD-10-CM | POA: Insufficient documentation

## 2017-05-29 LAB — BLOOD GAS, ARTERIAL
Acid-Base Excess: 0.2 mmol/L (ref 0.0–2.0)
Bicarbonate: 24.1 mmol/L (ref 20.0–28.0)
Drawn by: 449841
O2 Saturation: 96.2 %
Patient temperature: 98.6
pCO2 arterial: 37.5 mmHg (ref 32.0–48.0)
pH, Arterial: 7.424 (ref 7.350–7.450)
pO2, Arterial: 79.9 mmHg — ABNORMAL LOW (ref 83.0–108.0)

## 2017-05-29 LAB — URINALYSIS, ROUTINE W REFLEX MICROSCOPIC
Bilirubin Urine: NEGATIVE
Glucose, UA: NEGATIVE mg/dL
Ketones, ur: NEGATIVE mg/dL
Leukocytes, UA: NEGATIVE
Nitrite: NEGATIVE
Protein, ur: NEGATIVE mg/dL
Specific Gravity, Urine: 1.025 (ref 1.005–1.030)
pH: 6 (ref 5.0–8.0)

## 2017-05-29 LAB — APTT: aPTT: 28 seconds (ref 24–36)

## 2017-05-29 LAB — COMPREHENSIVE METABOLIC PANEL
ALT: 17 U/L (ref 14–54)
AST: 18 U/L (ref 15–41)
Albumin: 3.5 g/dL (ref 3.5–5.0)
Alkaline Phosphatase: 74 U/L (ref 38–126)
Anion gap: 8 (ref 5–15)
BUN: 11 mg/dL (ref 6–20)
CO2: 21 mmol/L — ABNORMAL LOW (ref 22–32)
Calcium: 8.9 mg/dL (ref 8.9–10.3)
Chloride: 107 mmol/L (ref 101–111)
Creatinine, Ser: 0.7 mg/dL (ref 0.44–1.00)
GFR calc Af Amer: >60 mL/min (ref >60)
GFR calc non Af Amer: >60 mL/min (ref >60)
Glucose, Bld: 155 mg/dL — ABNORMAL HIGH (ref 65–99)
Potassium: 3 mmol/L — ABNORMAL LOW (ref 3.5–5.1)
Sodium: 136 mmol/L (ref 135–145)
Total Bilirubin: 0.3 mg/dL (ref 0.3–1.2)
Total Protein: 6.6 g/dL (ref 6.5–8.1)

## 2017-05-29 LAB — CBC
HCT: 39.4 % (ref 36.0–46.0)
Hemoglobin: 12.6 g/dL (ref 12.0–15.0)
MCH: 30.1 pg (ref 26.0–34.0)
MCHC: 32 g/dL (ref 30.0–36.0)
MCV: 94 fL (ref 78.0–100.0)
Platelets: 303 10*3/uL (ref 150–400)
RBC: 4.19 MIL/uL (ref 3.87–5.11)
RDW: 14.6 % (ref 11.5–15.5)
WBC: 11.7 10*3/uL — ABNORMAL HIGH (ref 4.0–10.5)

## 2017-05-29 LAB — URINALYSIS, MICROSCOPIC (REFLEX)

## 2017-05-29 LAB — HCG, SERUM, QUALITATIVE: Preg, Serum: NEGATIVE

## 2017-05-29 LAB — TYPE AND SCREEN
ABO/RH(D): O POS
Antibody Screen: NEGATIVE

## 2017-05-29 LAB — SURGICAL PCR SCREEN
MRSA, PCR: NEGATIVE
Staphylococcus aureus: NEGATIVE

## 2017-05-29 LAB — PROTIME-INR
INR: 0.93
Prothrombin Time: 12.4 seconds (ref 11.4–15.2)

## 2017-05-29 MED FILL — tiZANidine HCL 4 MG TABS: 4 | 30 days supply | Qty: 90 | Fill #0

## 2017-05-29 NOTE — Progress Notes (Signed)
PCP: Dr. Arnoldo Morale  Cardiologist: pt denies  EKG: 05/03/17 in Epic  Stress test: pt denies ever  ECHO: 11/24/2013 in EPIC  Cardiac Cath: pt denies ever  Chest x-ray: 05/03/17

## 2017-06-01 ENCOUNTER — Encounter: Payer: MEDICAID | Admitting: Physical Medicine & Rehabilitation

## 2017-06-01 ENCOUNTER — Encounter (HOSPITAL_COMMUNITY): Payer: Self-pay | Admitting: Certified Registered Nurse Anesthetist

## 2017-06-01 ENCOUNTER — Telehealth: Payer: Self-pay | Admitting: Cardiothoracic Surgery

## 2017-06-01 ENCOUNTER — Other Ambulatory Visit: Payer: Self-pay | Admitting: *Deleted

## 2017-06-01 MED ORDER — VANCOMYCIN HCL IN DEXTROSE 1-5 GM/200ML-% IV SOLN: 1000.0000 mg | INTRAVENOUS | Status: DC

## 2017-06-01 NOTE — Telephone Encounter (Signed)
Patient preop chest xray reviewed and discussed with Dr Elsworth Soho . With complete clearing of infiltrates after not smoking and steroids . Dr Elsworth Soho concurs to hold on lung biopsy and continue  close follow up and avoiding smoking. Patient aware to make follow up appointment with Dr Elsworth Soho and surgery cancelled

## 2017-06-02 ENCOUNTER — Inpatient Hospital Stay (HOSPITAL_COMMUNITY): Admission: RE | Admit: 2017-06-02 | Payer: Self-pay | Source: Ambulatory Visit | Admitting: Cardiothoracic Surgery

## 2017-06-02 ENCOUNTER — Encounter (HOSPITAL_COMMUNITY): Admission: RE | Payer: Self-pay | Source: Ambulatory Visit

## 2017-06-02 SURGERY — BRONCHOSCOPY, VIDEO-ASSISTED
Anesthesia: General | Site: Chest

## 2017-06-03 ENCOUNTER — Ambulatory Visit: Payer: Self-pay | Admitting: Family Medicine

## 2017-06-04 ENCOUNTER — Encounter: Payer: Self-pay | Admitting: Cardiothoracic Surgery

## 2017-06-08 ENCOUNTER — Inpatient Hospital Stay: Payer: Self-pay | Admitting: Adult Health

## 2017-06-10 ENCOUNTER — Telehealth: Payer: Self-pay | Admitting: Pulmonary Disease

## 2017-06-10 NOTE — Telephone Encounter (Signed)
Pt called to let us know she could not make it to her appt for HFU with RA tomorrow as she has not way to transport her grandchild with her(no car seat). Pt would like to know if RA is okay with her waiting to be seen until October;if not please advise where patient can be worked in on [place] schedule. Thanks.

## 2017-06-11 ENCOUNTER — Inpatient Hospital Stay: Payer: Self-pay | Admitting: Pulmonary Disease

## 2017-06-11 NOTE — Telephone Encounter (Signed)
Called and spoke to pt. Informed her the recs per RA. Pt states she cannot come before 12 on Tues or Thurs because of her not having a ride. Pt is unable to see TP in [place] and the first available in [place] with any provider is on 07/06/17 with SG.   Dr. Elsworth Soho please advise if ok for pt to wait until 8/20 or double book with you? Thanks.

## 2017-06-11 NOTE — Telephone Encounter (Signed)
Okay to wait 1-2 months for this appointment

## 2017-06-11 NOTE — Telephone Encounter (Signed)
Pl work in with TP

## 2017-06-12 ENCOUNTER — Ambulatory Visit: Payer: Self-pay | Admitting: Family Medicine

## 2017-06-12 NOTE — Telephone Encounter (Signed)
ATC pt- unable to leave vm, due to mailbox not being setup.  Will call back  

## 2017-06-15 NOTE — Telephone Encounter (Signed)
LMOMTCB x 1 

## 2017-06-16 NOTE — Telephone Encounter (Signed)
Pt scheduled for HFU with TP.  appt reminder card sent to pt at her request.  Nothing further needed.

## 2017-06-22 MED FILL — ?FLUOXETINE HCL 20MG TABLET: 20 | 30 days supply | Qty: 60 | Fill #2

## 2017-06-22 MED FILL — tiZANidine HCL 4 MG TABS: 4 | 30 days supply | Qty: 90 | Fill #1

## 2017-06-22 MED FILL — GABAPENTIN 300 MG CAPSULE: 300 | 30 days supply | Qty: 120 | Fill #2

## 2017-06-23 MED FILL — ?OMEPRAZOLE DR 20 MG CAPSUL: 20 | 30 days supply | Qty: 30 | Fill #2

## 2017-06-24 ENCOUNTER — Ambulatory Visit: Payer: Self-pay

## 2017-06-24 ENCOUNTER — Ambulatory Visit: Payer: Self-pay | Attending: Family Medicine | Admitting: Family Medicine

## 2017-06-24 VITALS — BP 124/87 | HR 76 | Temp 98.2°F | Ht 61.0 in | Wt 151.0 lb

## 2017-06-24 DIAGNOSIS — M549 Dorsalgia, unspecified: Secondary | ICD-10-CM | POA: Insufficient documentation

## 2017-06-24 DIAGNOSIS — G8929 Other chronic pain: Secondary | ICD-10-CM | POA: Insufficient documentation

## 2017-06-24 DIAGNOSIS — Z87820 Personal history of traumatic brain injury: Secondary | ICD-10-CM | POA: Insufficient documentation

## 2017-06-24 DIAGNOSIS — F329 Major depressive disorder, single episode, unspecified: Secondary | ICD-10-CM | POA: Insufficient documentation

## 2017-06-24 DIAGNOSIS — J449 Chronic obstructive pulmonary disease, unspecified: Secondary | ICD-10-CM | POA: Insufficient documentation

## 2017-06-24 DIAGNOSIS — Z880 Allergy status to penicillin: Secondary | ICD-10-CM | POA: Insufficient documentation

## 2017-06-24 DIAGNOSIS — K219 Gastro-esophageal reflux disease without esophagitis: Secondary | ICD-10-CM | POA: Insufficient documentation

## 2017-06-24 DIAGNOSIS — M1990 Unspecified osteoarthritis, unspecified site: Secondary | ICD-10-CM | POA: Insufficient documentation

## 2017-06-24 DIAGNOSIS — F419 Anxiety disorder, unspecified: Secondary | ICD-10-CM | POA: Insufficient documentation

## 2017-06-24 DIAGNOSIS — M542 Cervicalgia: Secondary | ICD-10-CM | POA: Insufficient documentation

## 2017-06-24 DIAGNOSIS — K009 Disorder of tooth development, unspecified: Secondary | ICD-10-CM

## 2017-06-24 DIAGNOSIS — Z7951 Long term (current) use of inhaled steroids: Secondary | ICD-10-CM | POA: Insufficient documentation

## 2017-06-24 DIAGNOSIS — R569 Unspecified convulsions: Secondary | ICD-10-CM | POA: Insufficient documentation

## 2017-06-24 DIAGNOSIS — E7800 Pure hypercholesterolemia, unspecified: Secondary | ICD-10-CM | POA: Insufficient documentation

## 2017-06-24 DIAGNOSIS — K089 Disorder of teeth and supporting structures, unspecified: Secondary | ICD-10-CM | POA: Insufficient documentation

## 2017-06-24 DIAGNOSIS — Z79899 Other long term (current) drug therapy: Secondary | ICD-10-CM | POA: Insufficient documentation

## 2017-06-25 ENCOUNTER — Encounter: Payer: Self-pay | Admitting: Family Medicine

## 2017-06-25 MED ORDER — CLINDAMYCIN HCL 300 MG PO CAPS: 300.0000 mg | ORAL_CAPSULE | Freq: Two times a day (BID) | ORAL | 0 refills | Status: DC
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
## 2017-07-03 ENCOUNTER — Encounter: Payer: Self-pay | Admitting: Family Medicine

## 2017-07-03 ENCOUNTER — Ambulatory Visit: Payer: Self-pay | Attending: Family Medicine | Admitting: Family Medicine

## 2017-07-03 VITALS — BP 128/84 | HR 81 | Temp 98.1°F | Ht 61.0 in | Wt 155.4 lb

## 2017-07-03 DIAGNOSIS — Z87820 Personal history of traumatic brain injury: Secondary | ICD-10-CM | POA: Insufficient documentation

## 2017-07-03 DIAGNOSIS — N920 Excessive and frequent menstruation with regular cycle: Secondary | ICD-10-CM | POA: Insufficient documentation

## 2017-07-03 DIAGNOSIS — R569 Unspecified convulsions: Secondary | ICD-10-CM | POA: Insufficient documentation

## 2017-07-03 DIAGNOSIS — Z91041 Radiographic dye allergy status: Secondary | ICD-10-CM | POA: Insufficient documentation

## 2017-07-03 DIAGNOSIS — M1990 Unspecified osteoarthritis, unspecified site: Secondary | ICD-10-CM | POA: Insufficient documentation

## 2017-07-03 DIAGNOSIS — K219 Gastro-esophageal reflux disease without esophagitis: Secondary | ICD-10-CM | POA: Insufficient documentation

## 2017-07-03 DIAGNOSIS — F520 Hypoactive sexual desire disorder: Secondary | ICD-10-CM

## 2017-07-03 DIAGNOSIS — Z888 Allergy status to other drugs, medicaments and biological substances status: Secondary | ICD-10-CM | POA: Insufficient documentation

## 2017-07-03 DIAGNOSIS — R6882 Decreased libido: Secondary | ICD-10-CM | POA: Insufficient documentation

## 2017-07-03 DIAGNOSIS — Z79899 Other long term (current) drug therapy: Secondary | ICD-10-CM | POA: Insufficient documentation

## 2017-07-03 DIAGNOSIS — Z881 Allergy status to other antibiotic agents status: Secondary | ICD-10-CM | POA: Insufficient documentation

## 2017-07-03 DIAGNOSIS — F419 Anxiety disorder, unspecified: Secondary | ICD-10-CM | POA: Insufficient documentation

## 2017-07-03 DIAGNOSIS — F329 Major depressive disorder, single episode, unspecified: Secondary | ICD-10-CM | POA: Insufficient documentation

## 2017-07-03 DIAGNOSIS — Z880 Allergy status to penicillin: Secondary | ICD-10-CM | POA: Insufficient documentation

## 2017-07-03 DIAGNOSIS — Z885 Allergy status to narcotic agent status: Secondary | ICD-10-CM | POA: Insufficient documentation

## 2017-07-03 DIAGNOSIS — E7800 Pure hypercholesterolemia, unspecified: Secondary | ICD-10-CM | POA: Insufficient documentation

## 2017-07-03 DIAGNOSIS — G8929 Other chronic pain: Secondary | ICD-10-CM | POA: Insufficient documentation

## 2017-07-03 DIAGNOSIS — J449 Chronic obstructive pulmonary disease, unspecified: Secondary | ICD-10-CM | POA: Insufficient documentation

## 2017-07-03 DIAGNOSIS — N8110 Cystocele, unspecified: Secondary | ICD-10-CM | POA: Insufficient documentation

## 2017-07-03 DIAGNOSIS — M542 Cervicalgia: Secondary | ICD-10-CM | POA: Insufficient documentation
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
## 2017-07-03 NOTE — Patient Instructions (Signed)

## 2017-07-04 LAB — CBC
HEMOGLOBIN: 13.3 g/dL (ref 11.1–15.9)
Hematocrit: 39.8 % (ref 34.0–46.6)
MCH: 30.4 pg (ref 26.6–33.0)
MCHC: 33.4 g/dL (ref 31.5–35.7)
MCV: 91 fL (ref 79–97)
Platelets: 361 10*3/uL (ref 150–379)
RBC: 4.38 x10E6/uL (ref 3.77–5.28)
RDW: 14.9 % (ref 12.3–15.4)
WBC: 11.5 10*3/uL — ABNORMAL HIGH (ref 3.4–10.8)

## 2017-07-04 LAB — FSH/LH
FSH: 11.2 m[IU]/mL
LH: 4.9 m[IU]/mL

## 2017-07-04 LAB — ESTRADIOL: Estradiol: 80.4 pg/mL

## 2017-07-06 ENCOUNTER — Inpatient Hospital Stay: Payer: Self-pay | Admitting: Acute Care

## 2017-07-08 ENCOUNTER — Ambulatory Visit (HOSPITAL_COMMUNITY): Payer: Self-pay

## 2017-07-08 ENCOUNTER — Telehealth: Payer: Self-pay

## 2017-07-08 ENCOUNTER — Encounter: Payer: Self-pay | Admitting: Physical Medicine & Rehabilitation

## 2017-07-08 NOTE — Telephone Encounter (Signed)
Pt called and informed me that [facility] hospital called and told her that she would need to pay for her Ultrasound up front. Pt states that she does not have the money. Pt was also informed of lab results.

## 2017-07-14 ENCOUNTER — Ambulatory Visit (INDEPENDENT_AMBULATORY_CARE_PROVIDER_SITE_OTHER)
Admission: RE | Admit: 2017-07-14 | Discharge: 2017-07-14 | Disposition: A | Discharge status: Home / Self Care | Payer: Self-pay | Source: Ambulatory Visit | Attending: Adult Health | Admitting: Adult Health

## 2017-07-14 ENCOUNTER — Ambulatory Visit (INDEPENDENT_AMBULATORY_CARE_PROVIDER_SITE_OTHER): Payer: Self-pay | Admitting: Adult Health

## 2017-07-14 ENCOUNTER — Encounter: Payer: Self-pay | Admitting: Adult Health

## 2017-07-14 DIAGNOSIS — J449 Chronic obstructive pulmonary disease, unspecified: Secondary | ICD-10-CM

## 2017-07-14 DIAGNOSIS — J849 Interstitial pulmonary disease, unspecified: Secondary | ICD-10-CM

## 2017-07-14 NOTE — Assessment & Plan Note (Addendum)
GGO bilaterally dating back to 2015 in active smoker . Suspcious for RB ILD .  CXR cleared with steroids  Check CXR today , since off steroids x 3 weeks  follow up PFT on return in 6 weeks .  Smoking cessation

## 2017-07-14 NOTE — Assessment & Plan Note (Signed)
Smoking cessation  Cont on BREO  Check PFT on return

## 2017-07-14 NOTE — Patient Instructions (Signed)
Very important to quit smoking.  Chest xray today .  Follow up Dr. Elsworth Soho  In 6-8 weeks with PFT .  Please contact office for sooner follow up if symptoms do not improve or worsen or seek emergency care
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
## 2017-07-16 NOTE — Progress Notes (Signed)
Reviewed & agree with plan  

## 2017-07-21 MED FILL — busPIRone HCL 7.5 MG TABS: 7.5 | 30 days supply | Qty: 60 | Fill #2

## 2017-07-21 MED FILL — ?OMEPRAZOLE DR 20 MG CAPSUL: 20 | 15 days supply | Qty: 30 | Fill #0

## 2017-07-21 MED FILL — GABAPENTIN 300 MG CAPSULE: 300 | 30 days supply | Qty: 120 | Fill #3

## 2017-07-21 MED FILL — ?ATORVASTATIN 20 MG TABLET: 20 | 30 days supply | Qty: 30 | Fill #3

## 2017-07-21 MED FILL — tiZANidine HCL 4 MG TABS: 4 | 30 days supply | Qty: 90 | Fill #2

## 2017-07-31 ENCOUNTER — Ambulatory Visit: Payer: Self-pay | Admitting: Family Medicine

## 2017-08-17 DEATH — deceased

## 2017-08-18 ENCOUNTER — Ambulatory Visit: Payer: Self-pay | Admitting: Physical Medicine & Rehabilitation

## 2017-08-19 ENCOUNTER — Encounter: Payer: Self-pay | Attending: Physical Medicine & Rehabilitation | Admitting: Physical Medicine & Rehabilitation

## 2017-08-23 IMAGING — DX DG CHEST 1V PORT
1 series · 1 of 1 positions shown · non-contrast
Comparison: 05/05/2016 .

CLINICAL DATA: Chest pain.  Shortness of breath.

EXAM:
PORTABLE CHEST 1 VIEW

[chest ap]
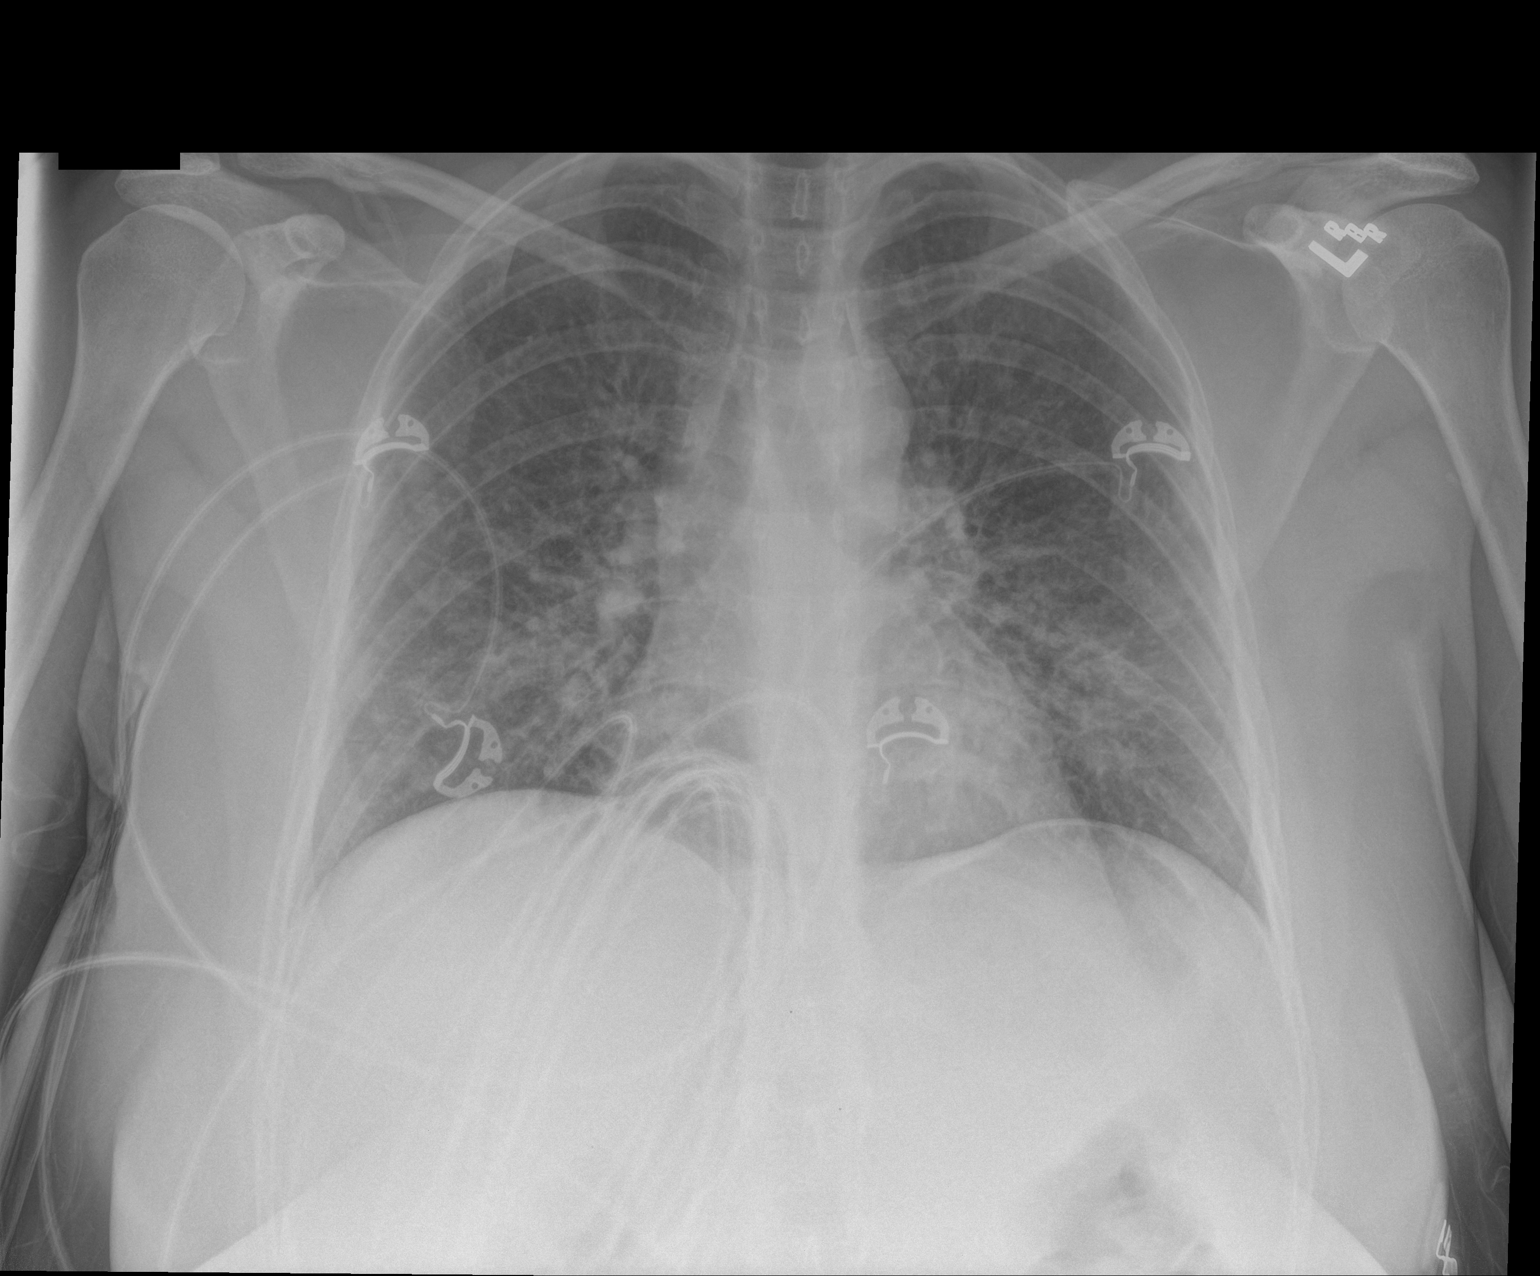

[1 of 1 positions shown; findings below may reference images not displayed]

FINDINGS: Mediastinum and hilar structures are normal. Heart size normal.
Bilateral pulmonary infiltrates noted. No pleural effusion or
pneumothorax
IMPRESSION: Bilateral pulmonary infiltrates noted. Findings consist with
pneumonia.

## 2017-08-25 ENCOUNTER — Other Ambulatory Visit: Payer: Self-pay | Admitting: Adult Health

## 2017-08-25 DIAGNOSIS — J849 Interstitial pulmonary disease, unspecified: Secondary | ICD-10-CM

## 2017-08-26 ENCOUNTER — Ambulatory Visit: Payer: Self-pay | Admitting: Adult Health
